# Patient Record
Sex: Female | Born: 1980
Health system: Southern US, Community
[De-identification: ages and names within clinical notes are randomized; demographics above are authoritative.]

## PROBLEM LIST (undated history)

## (undated) DIAGNOSIS — E7800 Pure hypercholesterolemia, unspecified: Secondary | ICD-10-CM

## (undated) DIAGNOSIS — E119 Type 2 diabetes mellitus without complications: Secondary | ICD-10-CM

## (undated) DIAGNOSIS — R197 Diarrhea, unspecified: Secondary | ICD-10-CM

## (undated) DIAGNOSIS — N926 Irregular menstruation, unspecified: Secondary | ICD-10-CM

## (undated) DIAGNOSIS — E1140 Type 2 diabetes mellitus with diabetic neuropathy, unspecified: Secondary | ICD-10-CM

## (undated) HISTORY — PX: CHOLECYSTECTOMY: SHX55

---

## 2002-04-16 ENCOUNTER — Encounter: Admission: RE | Admit: 2002-04-16 | Discharge: 2002-04-16 | Payer: Self-pay | Admitting: Obstetrics and Gynecology

## 2008-11-21 ENCOUNTER — Emergency Department (HOSPITAL_BASED_OUTPATIENT_CLINIC_OR_DEPARTMENT_OTHER): Admission: EM | Admit: 2008-11-21 | Discharge: 2008-11-21 | Payer: Self-pay | Admitting: Emergency Medicine

## 2009-03-10 ENCOUNTER — Ambulatory Visit: Payer: Self-pay | Admitting: Obstetrics and Gynecology

## 2009-03-10 ENCOUNTER — Encounter: Payer: Self-pay | Admitting: Physician Assistant

## 2009-03-10 ENCOUNTER — Other Ambulatory Visit: Payer: Self-pay | Admitting: Obstetrics and Gynecology

## 2009-03-10 LAB — CONVERTED CEMR LAB
FSH: 6.9 milliintl units/mL
LH: 9.9 milliintl units/mL

## 2009-03-31 ENCOUNTER — Ambulatory Visit: Payer: Self-pay | Admitting: Obstetrics and Gynecology

## 2009-09-22 ENCOUNTER — Emergency Department (HOSPITAL_BASED_OUTPATIENT_CLINIC_OR_DEPARTMENT_OTHER): Admission: EM | Admit: 2009-09-22 | Discharge: 2009-09-22 | Payer: Self-pay | Admitting: Emergency Medicine

## 2010-02-02 ENCOUNTER — Emergency Department (HOSPITAL_BASED_OUTPATIENT_CLINIC_OR_DEPARTMENT_OTHER): Admission: EM | Admit: 2010-02-02 | Discharge: 2009-09-24 | Payer: Self-pay | Admitting: Emergency Medicine

## 2010-05-14 LAB — GLUCOSE, CAPILLARY: Glucose-Capillary: 74 mg/dL (ref 70–99)

## 2010-06-02 LAB — GC/CHLAMYDIA PROBE AMP, GENITAL
Chlamydia, DNA Probe: NEGATIVE
GC Probe Amp, Genital: NEGATIVE

## 2010-06-02 LAB — RPR: RPR Ser Ql: NONREACTIVE

## 2010-06-02 LAB — URINALYSIS, ROUTINE W REFLEX MICROSCOPIC
Ketones, ur: 15 mg/dL — AB
Nitrite: NEGATIVE
Protein, ur: 100 mg/dL — AB

## 2010-06-02 LAB — PREGNANCY, URINE: Preg Test, Ur: NEGATIVE

## 2010-06-02 LAB — WET PREP, GENITAL: Clue Cells Wet Prep HPF POC: NONE SEEN

## 2011-08-01 ENCOUNTER — Emergency Department (HOSPITAL_BASED_OUTPATIENT_CLINIC_OR_DEPARTMENT_OTHER)
Admission: EM | Admit: 2011-08-01 | Discharge: 2011-08-01 | Disposition: A | Discharge status: Home / Self Care | Payer: Self-pay | Attending: Emergency Medicine | Admitting: Emergency Medicine

## 2011-08-01 ENCOUNTER — Encounter (HOSPITAL_BASED_OUTPATIENT_CLINIC_OR_DEPARTMENT_OTHER): Payer: Self-pay | Admitting: Emergency Medicine

## 2011-08-01 ENCOUNTER — Emergency Department (HOSPITAL_BASED_OUTPATIENT_CLINIC_OR_DEPARTMENT_OTHER): Payer: Self-pay

## 2011-08-01 DIAGNOSIS — K8020 Calculus of gallbladder without cholecystitis without obstruction: Secondary | ICD-10-CM | POA: Insufficient documentation

## 2011-08-01 DIAGNOSIS — R1011 Right upper quadrant pain: Secondary | ICD-10-CM | POA: Insufficient documentation

## 2011-08-01 DIAGNOSIS — R112 Nausea with vomiting, unspecified: Secondary | ICD-10-CM | POA: Insufficient documentation

## 2011-08-01 HISTORY — DX: Irregular menstruation, unspecified: N92.6

## 2011-08-01 LAB — URINE MICROSCOPIC-ADD ON

## 2011-08-01 LAB — COMPREHENSIVE METABOLIC PANEL
AST: 208 U/L — ABNORMAL HIGH (ref 0–37)
Albumin: 3.6 g/dL (ref 3.5–5.2)
Calcium: 9.5 mg/dL (ref 8.4–10.5)
Creatinine, Ser: 0.9 mg/dL (ref 0.50–1.10)
Total Protein: 8 g/dL (ref 6.0–8.3)

## 2011-08-01 LAB — LIPASE, BLOOD: Lipase: 52 U/L (ref 11–59)

## 2011-08-01 LAB — URINALYSIS, ROUTINE W REFLEX MICROSCOPIC
Glucose, UA: NEGATIVE mg/dL
Hgb urine dipstick: NEGATIVE
Specific Gravity, Urine: 1.027 (ref 1.005–1.030)
Urobilinogen, UA: 1 mg/dL (ref 0.0–1.0)

## 2011-08-01 LAB — PREGNANCY, URINE: Preg Test, Ur: NEGATIVE

## 2011-08-01 MED ORDER — SODIUM CHLORIDE 0.9 % IV BOLUS (SEPSIS)
1000.0000 mL | Freq: Once | INTRAVENOUS | Status: AC
  Administered 2011-08-01: 1000 mL via INTRAVENOUS

## 2011-08-01 MED ORDER — PROMETHAZINE HCL 25 MG/ML IJ SOLN
25.0000 mg | Freq: Once | INTRAMUSCULAR | Status: AC
  Administered 2011-08-01: 25 mg via INTRAVENOUS
  Filled 2011-08-01: qty 1

## 2011-08-01 MED ORDER — ONDANSETRON HCL 4 MG/2ML IJ SOLN
4.0000 mg | Freq: Once | INTRAMUSCULAR | Status: AC
  Administered 2011-08-01: 4 mg via INTRAVENOUS
  Filled 2011-08-01: qty 2

## 2011-08-01 MED ORDER — HYDROCODONE-ACETAMINOPHEN 5-500 MG PO TABS: 1.0000 | ORAL_TABLET | Freq: Four times a day (QID) | ORAL | Status: AC | PRN

## 2011-08-01 MED ORDER — HYDROMORPHONE HCL PF 1 MG/ML IJ SOLN
1.0000 mg | Freq: Once | INTRAMUSCULAR | Status: AC
  Administered 2011-08-01: 1 mg via INTRAVENOUS
  Filled 2011-08-01: qty 1

## 2011-08-01 MED ORDER — PROMETHAZINE HCL 25 MG PO TABS: 25.0000 mg | ORAL_TABLET | Freq: Four times a day (QID) | ORAL | Status: DC | PRN

## 2011-08-01 NOTE — Discharge Instructions (Signed)
It is very important to follow a fat-free diet. Use Phenergan as directed, as needed for nausea and vomiting using hydrocodone-acetaminophen for pain but do not drive or operate machinery with hydrocodone-acetaminophen use. It is also very important to call Dr. Ermalene Searing office first thing in the morning when the office opens around 8 AM to be seen in the office either tomorrow or the next day. Explain that you were seen in the emergency department and that Dr. Daphine Deutscher wanted you to be seen quickly in the office for your gallbladder disease. However return to the emergency department at any time for emergent changing or worsening of symptoms.

## 2011-08-01 NOTE — ED Notes (Signed)
Vomiting approx 3 days a week x3 weeks.  Worse in past 2 days.  Abd pain started in RUQ.  Now entire abdomen.

## 2011-08-01 NOTE — ED Provider Notes (Signed)
Medical screening examination/treatment/procedure(s) were performed by non-physician practitioner and as supervising physician I was immediately available for consultation/collaboration.   Orpha Dain A Ho Parisi, MD 08/01/11 2239 

## 2011-08-01 NOTE — ED Provider Notes (Signed)
History     CSN: [id number]  Arrival date & time 08/01/11  1551   First MD Initiated Contact with Patient 08/01/11 1553      Chief Complaint  Patient presents with  . Emesis  . Abdominal Pain    (Consider location/radiation/quality/duration/timing/severity/associated sxs/prior treatment) HPI  Patient presents to emergency department complaining of a one month history of intermittent abdominal pain with associated nausea and vomiting particularly surrounding eating. Patient states that she notes that when she eats she has increased abdominal pain and vomiting. Patient states pain is in the upper abdomen usually the right upper quadrant and described as a sharp cramping sensation. Patient states over the last 2 days she's had more frequency of abdominal pain and today while at school had acute onset of vomiting with numerous episodes and therefore the school called EMS. Patient states she waited for her parents to arrive at school who brought her up to emergency department. Patient states she has history of irregular periods that required her going on daily birth control for which she takes however that is her only known medical problems and only medicine she takes a regular basis. She denies history of abdominal surgeries. Patient states her LMP was May the 20th was a 9 day period which is heavier than her normal periods. She denies associated fevers, chills, chest pain, shortness of breath, dysuria, hematuria, blood in her stool, vaginal discharge, or dyspareunia. She is G0 P0 she denies alleviating factors. Symptoms are aggravated by eating. She's taken no medication for symptoms prior to arrival.  Past Medical History  Diagnosis Date  . Irregular periods     History reviewed. No pertinent past surgical history.  No family history on file.  History  Substance Use Topics  . Smoking status: Former Games developer  . Smokeless tobacco: Not on file  . Alcohol Use: Yes    OB History    Grav  Para Term Preterm Abortions TAB SAB Ect Mult Living                  Review of Systems  All other systems reviewed and are negative.    Allergies  Sulfa drugs cross reactors  Home Medications  No current outpatient prescriptions on file.  BP 136/75  Pulse 60  Temp(Src) 98.3 F (36.8 C) (Oral)  Resp 16  Ht 5\' 6"  (1.676 m)  Wt 200 lb (90.719 kg)  BMI 32.28 kg/m2  SpO2 100%  LMP 07/16/2011  Physical Exam  Nursing note and vitals reviewed. Constitutional: She is oriented to person, place, and time. She appears well-developed and well-nourished. No distress.  HENT:  Head: Normocephalic and atraumatic.  Eyes: Conjunctivae are normal.  Neck: Normal range of motion. Neck supple.  Cardiovascular: Normal rate, regular rhythm, normal heart sounds and intact distal pulses.  Exam reveals no gallop and no friction rub.   No murmur heard. Pulmonary/Chest: Effort normal and breath sounds normal. No respiratory distress. She has no wheezes. She has no rales. She exhibits no tenderness.  Abdominal: Soft. Bowel sounds are normal. She exhibits no distension and no mass. There is tenderness. There is no rebound and no guarding.       TTP of RUQ with guarding but no rigidity or peritoneal signs.   Musculoskeletal: Normal range of motion. She exhibits no edema and no tenderness.  Neurological: She is alert and oriented to person, place, and time.  Skin: Skin is warm and dry. No rash noted. She is not diaphoretic. No erythema.  Psychiatric: She has a normal mood and affect.    ED Course  Procedures (including critical care time)  IV zofran and dilaudid.   Labs Reviewed  COMPREHENSIVE METABOLIC PANEL - Abnormal; Notable for the following:    Potassium 3.3 (*)    AST 208 (*)    ALT 155 (*)    GFR calc non Af Amer 85 (*)    All other components within normal limits  URINALYSIS, ROUTINE W REFLEX MICROSCOPIC - Abnormal; Notable for the following:    Color, Urine AMBER (*) BIOCHEMICALS  MAY BE AFFECTED BY COLOR   pH 8.5 (*)    Bilirubin Urine SMALL (*)    Ketones, ur 15 (*)    Protein, ur 30 (*)    Leukocytes, UA SMALL (*)    All other components within normal limits  URINE MICROSCOPIC-ADD ON - Abnormal; Notable for the following:    Squamous Epithelial / LPF FEW (*)    Bacteria, UA MANY (*)    All other components within normal limits  LIPASE, BLOOD  PREGNANCY, URINE   US Abdomen Complete  08/01/2011  *RADIOLOGY REPORT*  Clinical Data:  Right upper quadrant pain, nausea, vomiting  ABDOMINAL ULTRASOUND COMPLETE  Comparison:  None.  Findings:  Gallbladder:  Numerous small echogenic shadowing gallstones noted. Wall thickness measures 2 mm.  No Murphy's sign.  Common Bile Duct:  Within normal limits in caliber.  Liver: No focal mass lesion identified.  Within normal limits in parenchymal echogenicity.  IVC:  Limited visualization.  No gross abnormality.  Pancreas:  No abnormality identified.  Spleen:  Limited visualization.  No focal abnormality.  4.2 cm length.  Right kidney:  Normal in size and parenchymal echogenicity.  No evidence of mass or hydronephrosis.  Left kidney:  Normal in size and parenchymal echogenicity.  No evidence of mass or hydronephrosis.  Abdominal Aorta:  No aneurysm identified.  IMPRESSION: Cholelithiasis.  No biliary dilatation or other acute process by ultrasound  Original Report Authenticated By: Judie Petit. Ruel Favors, M.D.   6:02 PM I reviewed history of present illness, physical exam, ultrasound findings, and labs with Dr. Daphine Deutscher with general surgery. Since patient's pain is under control however ongoing nausea he suggests continuing to hydrate with IV fluids and control nausea in the ER however he states that she is appropriate for discharge from the emergency department after nausea is under control with an order for a clear liquid diet at home andoutpatient management of nausea and pain with anti-emetics and pain medication. He requests for the patient to  call his office first thing in the morning to be worked in an office tomorrow to be further evaluated for her cholelithiasis. I spoke at length with patient about treatment plan and she is agreeable to plan. However she voices her understanding to return to emergency department at any time for emergent changing or worsening of symptoms.  1. Cholelithiasis   2. Nausea and vomiting       MDM  Afebrile and non toxic appearing with patient nausea now improved and tolerating PO fluids. Cholelithiasis without evidence of acute cholecystitis. No peritoneal signs.   Follow up established with general surgery for tomorrow. Patient agreeable to plan.         [place], [place] 08/01/11 [phone]

## 2011-08-07 NOTE — ED Notes (Signed)
Request for records received from [facility] sent records per request after receiving pt signauture of request of records to this clinic

## 2011-09-12 ENCOUNTER — Encounter (HOSPITAL_BASED_OUTPATIENT_CLINIC_OR_DEPARTMENT_OTHER): Payer: Self-pay | Admitting: Emergency Medicine

## 2011-09-12 ENCOUNTER — Emergency Department (HOSPITAL_BASED_OUTPATIENT_CLINIC_OR_DEPARTMENT_OTHER)
Admission: EM | Admit: 2011-09-12 | Discharge: 2011-09-12 | Disposition: A | Discharge status: Home / Self Care | Payer: Self-pay | Attending: Emergency Medicine | Admitting: Emergency Medicine

## 2011-09-12 DIAGNOSIS — N390 Urinary tract infection, site not specified: Secondary | ICD-10-CM | POA: Insufficient documentation

## 2011-09-12 DIAGNOSIS — Z9089 Acquired absence of other organs: Secondary | ICD-10-CM | POA: Insufficient documentation

## 2011-09-12 DIAGNOSIS — Z87891 Personal history of nicotine dependence: Secondary | ICD-10-CM | POA: Insufficient documentation

## 2011-09-12 LAB — URINE MICROSCOPIC-ADD ON

## 2011-09-12 LAB — URINALYSIS, ROUTINE W REFLEX MICROSCOPIC
Glucose, UA: NEGATIVE mg/dL
Ketones, ur: 15 mg/dL — AB
pH: 5 (ref 5.0–8.0)

## 2011-09-12 MED ORDER — CIPROFLOXACIN HCL 250 MG PO TABS: 250.0000 mg | ORAL_TABLET | Freq: Two times a day (BID) | ORAL | Status: AC

## 2011-09-12 MED ORDER — IBUPROFEN 600 MG PO TABS: 600.0000 mg | ORAL_TABLET | Freq: Four times a day (QID) | ORAL | Status: AC | PRN

## 2011-09-12 MED ORDER — IBUPROFEN 800 MG PO TABS
800.0000 mg | ORAL_TABLET | Freq: Once | ORAL | Status: AC
  Administered 2011-09-12: 800 mg via ORAL
  Filled 2011-09-12: qty 1

## 2011-09-12 MED ORDER — PHENAZOPYRIDINE HCL 200 MG PO TABS: 200.0000 mg | ORAL_TABLET | Freq: Three times a day (TID) | ORAL | Status: AC

## 2011-09-12 NOTE — ED Provider Notes (Signed)
History     CSN: [id number]  Arrival date & time 09/12/11  4132   First MD Initiated Contact with Patient 09/12/11 [phone]      Chief Complaint  Patient presents with  . Dysuria    (Consider location/radiation/quality/duration/timing/severity/associated sxs/prior treatment) HPI  C/o dysuria, urinary freq/urgency x 2 weeks, now worsening. Began to have right back pain since menstrual cycle began which is not atypical. Denies fever, possible chills. Denies N/V. C/O diffuse lower pain since onset of mc yesterday which is typical. Denies rash. Has tried azo, water and cranberry juice with min relief.   ED Notes, ED Provider Notes from 09/12/11 0000 to 09/12/11 06:48:49       Bufford Buttner, RN 09/12/2011 06:47      Pt c/o dysuria x 2 weeks. Pt had gall bladder surgery 08/17/11    Past Medical History  Diagnosis Date  . Irregular periods     Past Surgical History  Procedure Date  . Cholecystectomy     No family history on file.  History  Substance Use Topics  . Smoking status: Former Games developer  . Smokeless tobacco: Not on file  . Alcohol Use: Yes    OB History    Grav Para Term Preterm Abortions TAB SAB Ect Mult Living                  Review of Systems  All other systems reviewed and are negative.  except as noted HPI   Allergies  Sulfa drugs cross reactors  Home Medications   Current Outpatient Rx  Name Route Sig Dispense Refill  . CIPROFLOXACIN HCL 250 MG PO TABS Oral Take 1 tablet (250 mg total) by mouth every 12 (twelve) hours. 6 tablet 0  . IBUPROFEN 600 MG PO TABS Oral Take 1 tablet (600 mg total) by mouth every 6 (six) hours as needed for pain. 30 tablet 0  . TRI-NORINYL (28) PO Oral Take 1 tablet by mouth daily.    Marland Kitchen PHENAZOPYRIDINE HCL 200 MG PO TABS Oral Take 1 tablet (200 mg total) by mouth 3 (three) times daily. 6 tablet 0  . PROMETHAZINE HCL 25 MG PO TABS Oral Take 1 tablet (25 mg total) by mouth every 6 (six) hours as needed for nausea. 30 tablet  0    BP 121/78  Pulse 67  Temp 97.7 F (36.5 C) (Oral)  Resp 18  SpO2 100%  LMP 09/11/2011  Physical Exam  Nursing note and vitals reviewed. Constitutional: She is oriented to person, place, and time. She appears well-developed.  HENT:  Head: Atraumatic.  Mouth/Throat: Oropharynx is clear and moist.  Eyes: Conjunctivae and EOM are normal. Pupils are equal, round, and reactive to light.  Neck: Normal range of motion. Neck supple.  Cardiovascular: Normal rate, regular rhythm, normal heart sounds and intact distal pulses.   Pulmonary/Chest: Effort normal and breath sounds normal. No respiratory distress. She has no wheezes. She has no rales.  Abdominal: Soft. She exhibits no distension. There is no tenderness. There is no rebound and no guarding.       No CVAT  Musculoskeletal: Normal range of motion.  Neurological: She is alert and oriented to person, place, and time.  Skin: Skin is warm and dry. No rash noted.  Psychiatric: She has a normal mood and affect.    ED Course  Procedures (including critical care time)  Labs Reviewed  URINALYSIS, ROUTINE W REFLEX MICROSCOPIC - Abnormal; Notable for the following:    Color, Urine  RED (*)  BIOCHEMICALS MAY BE AFFECTED BY COLOR   APPearance TURBID (*)     Specific Gravity, Urine 1.036 (*)     Hgb urine dipstick LARGE (*)     Ketones, ur 15 (*)     Protein, ur 100 (*)     Leukocytes, UA SMALL (*)     All other components within normal limits  URINE MICROSCOPIC-ADD ON - Abnormal; Notable for the following:    Squamous Epithelial / LPF FEW (*)     Bacteria, UA MANY (*)     Crystals CA OXALATE CRYSTALS (*)     All other components within normal limits  PREGNANCY, URINE   No results found.   1. UTI (lower urinary tract infection)     MDM  Contaminated urine sample but likely UTI based on clinical history. No pyelonephritis at this time. I do not suspect acute abdominal infectious etiology such as appendicitis. No EMC  precluding discharge at this time. Given Precautions for return. PMD f/u.         Forbes Cellar, MD 09/12/11 [phone]

## 2011-09-12 NOTE — ED Notes (Signed)
Pt c/o dysuria x 2 weeks. Pt had gall bladder surgery 08/17/11

## 2011-09-18 ENCOUNTER — Encounter (HOSPITAL_BASED_OUTPATIENT_CLINIC_OR_DEPARTMENT_OTHER): Payer: Self-pay | Admitting: *Deleted

## 2011-09-18 ENCOUNTER — Emergency Department (HOSPITAL_BASED_OUTPATIENT_CLINIC_OR_DEPARTMENT_OTHER)
Admission: EM | Admit: 2011-09-18 | Discharge: 2011-09-18 | Disposition: A | Discharge status: Home / Self Care | Payer: Self-pay | Attending: Emergency Medicine | Admitting: Emergency Medicine

## 2011-09-18 ENCOUNTER — Emergency Department (HOSPITAL_BASED_OUTPATIENT_CLINIC_OR_DEPARTMENT_OTHER): Payer: Self-pay

## 2011-09-18 DIAGNOSIS — R109 Unspecified abdominal pain: Secondary | ICD-10-CM | POA: Insufficient documentation

## 2011-09-18 DIAGNOSIS — N201 Calculus of ureter: Secondary | ICD-10-CM | POA: Insufficient documentation

## 2011-09-18 DIAGNOSIS — Z9089 Acquired absence of other organs: Secondary | ICD-10-CM | POA: Insufficient documentation

## 2011-09-18 LAB — URINE MICROSCOPIC-ADD ON

## 2011-09-18 LAB — CBC WITH DIFFERENTIAL/PLATELET
Basophils Relative: 0 % (ref 0–1)
Eosinophils Absolute: 0.1 10*3/uL (ref 0.0–0.7)
HCT: 33.8 % — ABNORMAL LOW (ref 36.0–46.0)
Hemoglobin: 11.4 g/dL — ABNORMAL LOW (ref 12.0–15.0)
Lymphs Abs: 3.9 10*3/uL (ref 0.7–4.0)
MCH: 30.3 pg (ref 26.0–34.0)
MCHC: 33.7 g/dL (ref 30.0–36.0)
MCV: 89.9 fL (ref 78.0–100.0)
Monocytes Absolute: 0.9 10*3/uL (ref 0.1–1.0)
Monocytes Relative: 7 % (ref 3–12)

## 2011-09-18 LAB — URINALYSIS, ROUTINE W REFLEX MICROSCOPIC
Glucose, UA: NEGATIVE mg/dL
Hgb urine dipstick: NEGATIVE
Ketones, ur: 15 mg/dL — AB
Protein, ur: NEGATIVE mg/dL

## 2011-09-18 LAB — BASIC METABOLIC PANEL
BUN: 10 mg/dL (ref 6–23)
Creatinine, Ser: 0.9 mg/dL (ref 0.50–1.10)
GFR calc Af Amer: >90 mL/min (ref >90)
GFR calc non Af Amer: 85 mL/min — ABNORMAL LOW (ref >90)
Glucose, Bld: 109 mg/dL — ABNORMAL HIGH (ref 70–99)

## 2011-09-18 MED ORDER — HYDROMORPHONE HCL 2 MG PO TABS: 2.0000 mg | ORAL_TABLET | ORAL | Status: AC | PRN

## 2011-09-18 MED ORDER — TAMSULOSIN HCL 0.4 MG PO CAPS: 0.4000 mg | ORAL_CAPSULE | Freq: Every day | ORAL | Status: DC

## 2011-09-18 MED ORDER — HYDROMORPHONE HCL PF 1 MG/ML IJ SOLN
1.0000 mg | Freq: Once | INTRAMUSCULAR | Status: AC
  Administered 2011-09-18: 1 mg via INTRAVENOUS
  Filled 2011-09-18: qty 1

## 2011-09-18 MED ORDER — SODIUM CHLORIDE 0.9 % IV BOLUS (SEPSIS)
1000.0000 mL | Freq: Once | INTRAVENOUS | Status: AC
  Administered 2011-09-18: 1000 mL via INTRAVENOUS

## 2011-09-18 MED ORDER — NAPROXEN 500 MG PO TABS: 500.0000 mg | ORAL_TABLET | Freq: Two times a day (BID) | ORAL | Status: AC

## 2011-09-18 MED ORDER — ONDANSETRON HCL 4 MG/2ML IJ SOLN
4.0000 mg | Freq: Once | INTRAMUSCULAR | Status: AC
  Administered 2011-09-18: 4 mg via INTRAVENOUS
  Filled 2011-09-18: qty 2

## 2011-09-18 NOTE — ED Notes (Signed)
MD at bedside. 

## 2011-09-18 NOTE — ED Notes (Signed)
Patient transported to CT 

## 2011-09-18 NOTE — ED Provider Notes (Signed)
History     CSN: [id number]  Arrival date & time 09/18/11  0505   First MD Initiated Contact with Patient 09/18/11 [phone]      Chief Complaint  Patient presents with  . Urinary Frequency    (Consider location/radiation/quality/duration/timing/severity/associated sxs/prior treatment) Patient is a 31 y.o. female presenting with frequency. The history is provided by the patient.  Urinary Frequency   31 year old female had been seen here 6 days ago and diagnosed with a UTI and treated with an antibiotic. Patient says he took antibiotic and symptoms seemed to improve, but never completely resolved. Yesterday, she started having urinary urgency and frequency but was only urinating a small amount of the time. She has developed right flank pain which radiates around to the right lower quadrant pain this pain has been severe and she rated it at 9/10. She took 1200 mg of ibuprofen with no relief. She denies nausea vomiting and denies fever, chills, sweats. Nothing makes her pain better and nothing makes it worse.  Past Medical History  Diagnosis Date  . Irregular periods     Past Surgical History  Procedure Date  . Cholecystectomy     History reviewed. No pertinent family history.  History  Substance Use Topics  . Smoking status: Former Games developer  . Smokeless tobacco: Not on file  . Alcohol Use: Yes    OB History    Grav Para Term Preterm Abortions TAB SAB Ect Mult Living                  Review of Systems  Genitourinary: Positive for frequency.  All other systems reviewed and are negative.    Allergies  Sulfa drugs cross reactors  Home Medications   Current Outpatient Rx  Name Route Sig Dispense Refill  . IBUPROFEN 600 MG PO TABS Oral Take 1 tablet (600 mg total) by mouth every 6 (six) hours as needed for pain. 30 tablet 0  . TRI-NORINYL (28) PO Oral Take 1 tablet by mouth daily.    Marland Kitchen CIPROFLOXACIN HCL 250 MG PO TABS Oral Take 1 tablet (250 mg total) by mouth every 12  (twelve) hours. 6 tablet 0  . PROMETHAZINE HCL 25 MG PO TABS Oral Take 1 tablet (25 mg total) by mouth every 6 (six) hours as needed for nausea. 30 tablet 0    BP 137/81  Pulse 58  Temp 98 F (36.7 C) (Oral)  Resp 16  Ht 5\' 6"  (1.676 m)  Wt 210 lb (95.255 kg)  BMI 33.89 kg/m2  SpO2 100%  LMP 09/11/2011  Physical Exam  Nursing note and vitals reviewed.  31 year old female is resting comfortably and in no acute distress. Vital signs are significant for borderline bradycardia with heart rate of 58. Head is normocephalic and atraumatic. PERRLA, EOMI. Oropharynx is clear. Neck is nontender and supple. Back has no midline tenderness. There is moderate right CVA tenderness. Lungs are clear without rales, wheezes, or rhonchi. Heart has regular rate and rhythm without murmur. Abdomen is soft, flat without masses or hepatosplenomegaly. There is moderate right CVA tenderness and right lower abdominal tenderness. There is no rebound or guarding. Extremities have no cyanosis or edema, full range of motion is present. Skin is warm and dry without rash. Neurologic: Mental status is normal, cranial nerves are intact, there are no motor or sensory deficits.  ED Course  Procedures (including critical care time)  Results for orders placed during the hospital encounter of 09/18/11  URINALYSIS, ROUTINE W REFLEX  MICROSCOPIC      Component Value Range   Color, Urine AMBER (*) YELLOW   APPearance CLOUDY (*) CLEAR   Specific Gravity, Urine 1.034 (*) 1.005 - 1.030   pH 5.5  5.0 - 8.0   Glucose, UA NEGATIVE  NEGATIVE mg/dL   Hgb urine dipstick NEGATIVE  NEGATIVE   Bilirubin Urine SMALL (*) NEGATIVE   Ketones, ur 15 (*) NEGATIVE mg/dL   Protein, ur NEGATIVE  NEGATIVE mg/dL   Urobilinogen, UA 1.0  0.0 - 1.0 mg/dL   Nitrite NEGATIVE  NEGATIVE   Leukocytes, UA SMALL (*) NEGATIVE  PREGNANCY, URINE      Component Value Range   Preg Test, Ur NEGATIVE  NEGATIVE  CBC WITH DIFFERENTIAL      Component Value Range    WBC 11.9 (*) 4.0 - 10.5 K/uL   RBC 3.76 (*) 3.87 - 5.11 MIL/uL   Hemoglobin 11.4 (*) 12.0 - 15.0 g/dL   HCT 11.9 (*) 14.7 - 82.9 %   MCV 89.9  78.0 - 100.0 fL   MCH 30.3  26.0 - 34.0 pg   MCHC 33.7  30.0 - 36.0 g/dL   RDW 56.2  13.0 - 86.5 %   Platelets 351  150 - 400 K/uL   Neutrophils Relative 59  43 - 77 %   Neutro Abs 7.1  1.7 - 7.7 K/uL   Lymphocytes Relative 33  12 - 46 %   Lymphs Abs 3.9  0.7 - 4.0 K/uL   Monocytes Relative 7  3 - 12 %   Monocytes Absolute 0.9  0.1 - 1.0 K/uL   Eosinophils Relative 0  0 - 5 %   Eosinophils Absolute 0.1  0.0 - 0.7 K/uL   Basophils Relative 0  0 - 1 %   Basophils Absolute 0.0  0.0 - 0.1 K/uL  BASIC METABOLIC PANEL      Component Value Range   Sodium 137  135 - 145 mEq/L   Potassium 3.7  3.5 - 5.1 mEq/L   Chloride 101  96 - 112 mEq/L   CO2 24  19 - 32 mEq/L   Glucose, Bld 109 (*) 70 - 99 mg/dL   BUN 10  6 - 23 mg/dL   Creatinine, Ser 7.84  0.50 - 1.10 mg/dL   Calcium 9.2  8.4 - 69.6 mg/dL   GFR calc non Af Amer 85 (*) >90 mL/min   GFR calc Af Amer >90  >90 mL/min  URINE MICROSCOPIC-ADD ON      Component Value Range   Squamous Epithelial / LPF RARE  RARE   WBC, UA 0-2  <3 WBC/hpf   RBC / HPF 0-2  <3 RBC/hpf   Bacteria, UA RARE  RARE   Crystals CA OXALATE CRYSTALS (*) NEGATIVE   Urine-Other MICROSCOPIC EXAM PERFORMED ON UNCONCENTRATED URINE     Ct Abdomen Pelvis Wo Contrast  09/18/2011  *RADIOLOGY REPORT*  Clinical Data: Right flank pain.  CT ABDOMEN AND PELVIS WITHOUT CONTRAST  Technique:  Multidetector CT imaging of the abdomen and pelvis was performed following the standard protocol without intravenous contrast.  Comparison: None  Findings: The lung bases are clear.  No pleural effusion.  The unenhanced appearance of the liver is unremarkable.  No focal lesions or intrahepatic biliary dilatation to the gallbladder surgically absent.  No common bile duct dilatation.  The pancreas is unremarkable.  The spleen is normal in size.  No  focal lesions. The adrenal glands are normal.  The right kidney demonstrates  mild edema and mild right-sided hydronephrosis and hydroureter.  There is a 2 mm distal right ureteral calculus.  No bladder calculi.  No left-sided ureteral calculi.  The stomach, duodenum, small bowel and colon are grossly normal without oral contrast.  The appendix is normal.  No mesenteric or retroperitoneal mass or adenopathy.  Small scattered lymph nodes are noted.  The aorta is normal in caliber.  No atherosclerotic changes.  The uterus and ovaries are normal.  The bladder is normal.  No pelvic mass, adenopathy or free pelvic fluid collections.  No inguinal mass or hernia.  The bony structures are unremarkable.  IMPRESSION: 2 mm distal right ureteral calculus causing mild right-sided hydroureteronephrosis.  Original Report Authenticated By: P. Loralie Champagne, M.D.      1. Ureterolithiasis       MDM  Urinary tract infection with pyelonephritis versus ureteral colic. Her prior record was reviewed and she was given a three-day course of ciprofloxacin and Pyridium in the ED visit 6 days ago. Urinalysis at that time was clearly contaminated and alert she was treated based on symptoms. Urinalysis will be repeated today and if not clearly showing UTI, will will get CT scan. Of note, her recent renal ultrasound did not show any nephrolithiasis.  CT scan does show right ureteral calculus with hydronephrosis which would account for her pain. She had partial pain relief with hydromorphone and ondansetron. A second dose of hydromorphone gave very good relief of pain. She will be sent home with prescriptions for hydromorphone, naproxen, and tamsulosin and is referred to urology for followup. Her calculus is only 2 mm and would have been expected to have passed, so she may need to urologic evaluation.     Dione Booze, MD 09/18/11 [phone]

## 2011-09-18 NOTE — ED Notes (Signed)
Pt seen here last week and dx'd with UTI, pt finished meds but states that sxs have not improved.

## 2011-09-20 ENCOUNTER — Encounter (HOSPITAL_BASED_OUTPATIENT_CLINIC_OR_DEPARTMENT_OTHER): Payer: Self-pay

## 2011-09-20 DIAGNOSIS — Z87891 Personal history of nicotine dependence: Secondary | ICD-10-CM | POA: Insufficient documentation

## 2011-09-20 DIAGNOSIS — N201 Calculus of ureter: Secondary | ICD-10-CM | POA: Insufficient documentation

## 2011-09-20 DIAGNOSIS — T40605A Adverse effect of unspecified narcotics, initial encounter: Secondary | ICD-10-CM | POA: Insufficient documentation

## 2011-09-20 DIAGNOSIS — K5909 Other constipation: Secondary | ICD-10-CM | POA: Insufficient documentation

## 2011-09-20 NOTE — ED Notes (Signed)
C/o constipation-no BM x 2-3 days

## 2011-09-21 ENCOUNTER — Emergency Department (HOSPITAL_BASED_OUTPATIENT_CLINIC_OR_DEPARTMENT_OTHER)
Admission: EM | Admit: 2011-09-21 | Discharge: 2011-09-21 | Disposition: A | Discharge status: Home / Self Care | Payer: Self-pay | Attending: Emergency Medicine | Admitting: Emergency Medicine

## 2011-09-21 DIAGNOSIS — K5903 Drug induced constipation: Secondary | ICD-10-CM

## 2011-09-21 MED ORDER — MAGNESIUM CITRATE PO SOLN
1.0000 | Freq: Once | ORAL | Status: AC
  Administered 2011-09-21: 1 via ORAL
  Filled 2011-09-21: qty 296

## 2011-09-21 NOTE — ED Notes (Signed)
MD at bedside, rectal exam performed. Pt tolerated well.

## 2011-09-21 NOTE — ED Notes (Signed)
Pt was at [facility] three days ago, diagnosed with a kidney stone, and prescribed a narcotic. States that she is now constipated from the narcotic and has not had a bowel movement for two days. States that she tried to disimpact herself with no success. States that she took one dose of Miralax earlier today with no success. States that rectal pain is 10/10.

## 2011-09-21 NOTE — ED Provider Notes (Signed)
History     CSN: [id number]  Arrival date & time 09/20/11  2217   First MD Initiated Contact with Patient 09/21/11 0216      Chief Complaint  Patient presents with  . Constipation    (Consider location/radiation/quality/duration/timing/severity/associated sxs/prior treatment) HPI This is a 31 year old black female who was diagnosed with a ureteral stone several days ago and was placed on hydromorphone. She is subsequently not had a bowel movement for about the last 2-3 days. She is feeling rectal pressure but not frank pain. The pain of her ureteral colic comes and goes. It has been reasonably controlled with the indication she was prescribed. She took some MiraLAX yesterday without relief. She states the rectal pressure has improved since she has been waiting in the ED.  Past Medical History  Diagnosis Date  . Irregular periods   . Kidney stone     Past Surgical History  Procedure Date  . Cholecystectomy     No family history on file.  History  Substance Use Topics  . Smoking status: Former Games developer  . Smokeless tobacco: Not on file  . Alcohol Use: Yes    OB History    Grav Para Term Preterm Abortions TAB SAB Ect Mult Living                  Review of Systems  All other systems reviewed and are negative.    Allergies  Sulfa drugs cross reactors  Home Medications   Current Outpatient Rx  Name Route Sig Dispense Refill  . CIPROFLOXACIN HCL 250 MG PO TABS Oral Take 1 tablet (250 mg total) by mouth every 12 (twelve) hours. 6 tablet 0  . HYDROMORPHONE HCL 2 MG PO TABS Oral Take 1 tablet (2 mg total) by mouth every 4 (four) hours as needed for pain. 20 tablet 0  . IBUPROFEN 600 MG PO TABS Oral Take 1 tablet (600 mg total) by mouth every 6 (six) hours as needed for pain. 30 tablet 0  . NAPROXEN 500 MG PO TABS Oral Take 1 tablet (500 mg total) by mouth 2 (two) times daily. 30 tablet 0  . TRI-NORINYL (28) PO Oral Take 1 tablet by mouth daily.    Marland Kitchen PROMETHAZINE HCL  25 MG PO TABS Oral Take 1 tablet (25 mg total) by mouth every 6 (six) hours as needed for nausea. 30 tablet 0  . TAMSULOSIN HCL 0.4 MG PO CAPS Oral Take 1 capsule (0.4 mg total) by mouth daily. 10 capsule 0    BP 125/74  Pulse 62  Temp 98.2 F (36.8 C) (Oral)  Resp 16  Ht 5\' 6"  (1.676 m)  Wt 210 lb (95.255 kg)  BMI 33.89 kg/m2  SpO2 100%  LMP 09/11/2011  Physical Exam General: Well-developed, well-nourished female in no acute distress; appearance consistent with age of record HENT: normocephalic, atraumatic Eyes: pupils equal round and reactive to light; extraocular muscles intact Neck: supple Heart: regular rate and rhythm Lungs: clear to auscultation bilaterally Abdomen: soft; nondistended; nontender; no masses or hepatosplenomegaly; bowel sounds present Rectal: Normal tone; no stool in vault Extremities: No deformity; full range of motion Neurologic: Awake, alert and oriented; motor function intact in all extremities and symmetric; no facial droop Skin: Warm and dry Psychiatric: Normal mood and affect    ED Course  Procedures (including critical care time)     MDM  We'll administer magnesium citrate in the ED and advise her to continue the MiraLAX as long as she is taking  the hydromorphone.        Hanley Seamen, MD 09/21/11 [phone]

## 2012-08-21 ENCOUNTER — Encounter (HOSPITAL_BASED_OUTPATIENT_CLINIC_OR_DEPARTMENT_OTHER): Payer: Self-pay

## 2012-08-21 ENCOUNTER — Emergency Department (HOSPITAL_BASED_OUTPATIENT_CLINIC_OR_DEPARTMENT_OTHER)
Admission: EM | Admit: 2012-08-21 | Discharge: 2012-08-21 | Disposition: A | Discharge status: Home / Self Care | Payer: Self-pay | Attending: Emergency Medicine | Admitting: Emergency Medicine

## 2012-08-21 DIAGNOSIS — J3489 Other specified disorders of nose and nasal sinuses: Secondary | ICD-10-CM | POA: Insufficient documentation

## 2012-08-21 DIAGNOSIS — F17200 Nicotine dependence, unspecified, uncomplicated: Secondary | ICD-10-CM | POA: Insufficient documentation

## 2012-08-21 DIAGNOSIS — Z8742 Personal history of other diseases of the female genital tract: Secondary | ICD-10-CM | POA: Insufficient documentation

## 2012-08-21 DIAGNOSIS — R0982 Postnasal drip: Secondary | ICD-10-CM | POA: Insufficient documentation

## 2012-08-21 DIAGNOSIS — Z79899 Other long term (current) drug therapy: Secondary | ICD-10-CM | POA: Insufficient documentation

## 2012-08-21 DIAGNOSIS — J329 Chronic sinusitis, unspecified: Secondary | ICD-10-CM | POA: Insufficient documentation

## 2012-08-21 DIAGNOSIS — R209 Unspecified disturbances of skin sensation: Secondary | ICD-10-CM | POA: Insufficient documentation

## 2012-08-21 DIAGNOSIS — H6690 Otitis media, unspecified, unspecified ear: Secondary | ICD-10-CM | POA: Insufficient documentation

## 2012-08-21 DIAGNOSIS — R51 Headache: Secondary | ICD-10-CM | POA: Insufficient documentation

## 2012-08-21 DIAGNOSIS — H6692 Otitis media, unspecified, left ear: Secondary | ICD-10-CM

## 2012-08-21 DIAGNOSIS — Z87442 Personal history of urinary calculi: Secondary | ICD-10-CM | POA: Insufficient documentation

## 2012-08-21 DIAGNOSIS — Z791 Long term (current) use of non-steroidal anti-inflammatories (NSAID): Secondary | ICD-10-CM | POA: Insufficient documentation

## 2012-08-21 MED ORDER — CETIRIZINE-PSEUDOEPHEDRINE ER 5-120 MG PO TB12: 1.0000 | ORAL_TABLET | Freq: Every day | ORAL | Status: DC

## 2012-08-21 MED ORDER — AMOXICILLIN 500 MG PO CAPS: 500.0000 mg | ORAL_CAPSULE | Freq: Three times a day (TID) | ORAL | Status: DC

## 2012-08-21 NOTE — ED Notes (Signed)
C/o numbness to mouth and tongue, left ear pain x 2 days-states she just came from work out

## 2012-08-21 NOTE — ED Provider Notes (Addendum)
History    CSN: [id number] Arrival date & time 08/21/12  1440  First MD Initiated Contact with Patient 08/21/12 1506     Chief Complaint  Patient presents with  . Numbness   (Consider location/radiation/quality/duration/timing/severity/associated sxs/prior Treatment) Patient is a 32 y.o. female presenting with ear pain. The history is provided by the patient.  Otalgia Location:  Left Behind ear:  No abnormality Quality:  Sharp and sore Severity:  Moderate Onset quality:  Gradual Duration:  2 days Timing:  Constant Progression:  Unchanged Chronicity:  Recurrent Context: no water in ear   Relieved by:  Nothing Worsened by:  Nothing tried Ineffective treatments:  None tried Associated symptoms: congestion, headaches and rhinorrhea   Associated symptoms: no cough, no ear discharge, no fever, no neck pain and no sore throat   Associated symptoms comment:  States for 2 days has had bad taste in mouth and tingling of the bilateral tongue and lips Risk factors: no chronic ear infection and no prior ear surgery    Past Medical History  Diagnosis Date  . Irregular periods   . Kidney stone    Past Surgical History  Procedure Laterality Date  . Cholecystectomy     No family history on file. History  Substance Use Topics  . Smoking status: Current Every Day Smoker  . Smokeless tobacco: Not on file  . Alcohol Use: Yes   OB History   Grav Para Term Preterm Abortions TAB SAB Ect Mult Living                 Review of Systems  Constitutional: Negative for fever.  HENT: Positive for ear pain, congestion, rhinorrhea and postnasal drip. Negative for sore throat, neck pain and ear discharge.   Respiratory: Negative for cough.   Neurological: Positive for headaches.  All other systems reviewed and are negative.    Allergies  Sulfa drugs cross reactors  Home Medications   Current Outpatient Rx  Name  Route  Sig  Dispense  Refill  . naproxen (NAPROSYN) 500 MG tablet  Oral   Take 1 tablet (500 mg total) by mouth 2 (two) times daily.   30 tablet   0   . Norethin-Eth Estrad Triphasic (TRI-NORINYL, 28, PO)   Oral   Take 1 tablet by mouth daily.         Marland Kitchen EXPIRED: promethazine (PHENERGAN) 25 MG tablet   Oral   Take 1 tablet (25 mg total) by mouth every 6 (six) hours as needed for nausea.   30 tablet   0   . Tamsulosin HCl (FLOMAX) 0.4 MG CAPS   Oral   Take 1 capsule (0.4 mg total) by mouth daily.   10 capsule   0    BP 137/69  Pulse 98  Temp(Src) 99.4 F (37.4 C) (Oral)  Resp 16  Ht 5\' 6"  (1.676 m)  Wt 235 lb (106.595 kg)  BMI 37.95 kg/m2  SpO2 100%  LMP 07/24/2012 Physical Exam  Nursing note and vitals reviewed. Constitutional: She is oriented to person, place, and time. She appears well-developed and well-nourished. No distress.  HENT:  Head: Normocephalic and atraumatic.  Right Ear: Tympanic membrane and ear canal normal.  Left Ear: Ear canal normal. No mastoid tenderness. Tympanic membrane is injected, erythematous and bulging. A middle ear effusion is present.  Ears:  Nose: Mucosal edema present. Left sinus exhibits maxillary sinus tenderness.  Mouth/Throat: Mucous membranes are normal. No edematous.    Eyes: EOM are normal. Pupils  are equal, round, and reactive to light.  Cardiovascular: Normal rate, regular rhythm, normal heart sounds and intact distal pulses.  Exam reveals no friction rub.   No murmur heard. Pulmonary/Chest: Effort normal and breath sounds normal. She has no wheezes. She has no rales.  Abdominal: Soft. Bowel sounds are normal. She exhibits no distension. There is no tenderness. There is no rebound and no guarding.  Musculoskeletal: Normal range of motion. She exhibits no tenderness.  No edema  Neurological: She is alert and oriented to person, place, and time. No cranial nerve deficit.  Skin: Skin is warm and dry. No rash noted.  Psychiatric: She has a normal mood and affect. Her behavior is normal.     ED Course  Procedures (including critical care time) Labs Reviewed - No data to display No results found. 1. Otitis media, left   2. Sinusitis     MDM    Pt here c/o of bad taste in mouth, tingling of bilateral lips and tongue and left ear pain.  Pt found to have left otitis media, no dental issues and sinus congestion.  Feel most likely sinus problems are the cause of pt's sx and most likely the cause for her otitis.  Will treat with abx and have pt start zyrtec for allergies.  She is unable to afford nasal spray.  Gwyneth Sprout, MD 08/21/12 1541  Gwyneth Sprout, MD 08/21/12 1544

## 2013-03-02 ENCOUNTER — Emergency Department (HOSPITAL_BASED_OUTPATIENT_CLINIC_OR_DEPARTMENT_OTHER)
Admission: EM | Admit: 2013-03-02 | Discharge: 2013-03-02 | Disposition: A | Discharge status: Home / Self Care | Payer: Self-pay | Attending: Emergency Medicine | Admitting: Emergency Medicine

## 2013-03-02 ENCOUNTER — Encounter (HOSPITAL_BASED_OUTPATIENT_CLINIC_OR_DEPARTMENT_OTHER): Payer: Self-pay | Admitting: Emergency Medicine

## 2013-03-02 ENCOUNTER — Emergency Department (HOSPITAL_BASED_OUTPATIENT_CLINIC_OR_DEPARTMENT_OTHER): Payer: Self-pay

## 2013-03-02 DIAGNOSIS — H579 Unspecified disorder of eye and adnexa: Secondary | ICD-10-CM | POA: Insufficient documentation

## 2013-03-02 DIAGNOSIS — F17200 Nicotine dependence, unspecified, uncomplicated: Secondary | ICD-10-CM | POA: Insufficient documentation

## 2013-03-02 DIAGNOSIS — J069 Acute upper respiratory infection, unspecified: Secondary | ICD-10-CM | POA: Insufficient documentation

## 2013-03-02 DIAGNOSIS — Z3202 Encounter for pregnancy test, result negative: Secondary | ICD-10-CM | POA: Insufficient documentation

## 2013-03-02 DIAGNOSIS — Z8742 Personal history of other diseases of the female genital tract: Secondary | ICD-10-CM | POA: Insufficient documentation

## 2013-03-02 DIAGNOSIS — Z87442 Personal history of urinary calculi: Secondary | ICD-10-CM | POA: Insufficient documentation

## 2013-03-02 DIAGNOSIS — Z792 Long term (current) use of antibiotics: Secondary | ICD-10-CM | POA: Insufficient documentation

## 2013-03-02 DIAGNOSIS — H9209 Otalgia, unspecified ear: Secondary | ICD-10-CM | POA: Insufficient documentation

## 2013-03-02 DIAGNOSIS — Z79899 Other long term (current) drug therapy: Secondary | ICD-10-CM | POA: Insufficient documentation

## 2013-03-02 LAB — PREGNANCY, URINE: Preg Test, Ur: NEGATIVE

## 2013-03-02 NOTE — ED Notes (Signed)
Pt reports watery eyes, increased sore throat since eating shrimp earlier.

## 2013-03-02 NOTE — ED Notes (Signed)
Pt reports cough, congestion, shortness of breath x4 days.

## 2013-03-02 NOTE — ED Provider Notes (Signed)
CSN: [id number]     Arrival date & time 03/02/13  0023 History  This chart was scribed for Milanna Kozlov Alfonso Patten, MD by Ludger Nutting, ED Scribe. This patient was seen in room MH10/MH10 and the patient's care was started 12:49 AM.    Chief Complaint  Patient presents with  . Cough    Patient is a 33 y.o. female presenting with cough. The history is provided by the patient. No language interpreter was used.  Cough Cough characteristics:  Productive Sputum characteristics:  Nondescript Severity:  Mild Onset quality:  Gradual Timing:  Intermittent Progression:  Unchanged Chronicity:  New Smoker: yes   Context: not animal exposure   Relieved by:  Nothing Worsened by:  Nothing tried Ineffective treatments:  None tried Associated symptoms: ear pain, sinus congestion and sore throat   Associated symptoms: no chest pain, no chills, no diaphoresis, no eye discharge, no fever and no myalgias   Ear pain:    Location:  Bilateral   Severity:  Moderate   Onset quality:  Gradual   Timing:  Constant   Progression:  Unchanged   Chronicity:  New Risk factors: no chemical exposure and no recent travel     HPI Comments: Colleen Downs is a 33 y.o. female who presents to the Emergency Department complaining of 4 days of gradual onset, gradually worsening, constant cough with associated congestion and mild SOB. She also reports having a sore throat, watery eyes, ear pain. She reports eating shrimp earlier tonight which worsened her sore throat. She has taken Mucinex with mild relief. She denies any other symptoms at this time.   Past Medical History  Diagnosis Date  . Irregular periods   . Kidney stone    Past Surgical History  Procedure Laterality Date  . Cholecystectomy     No family history on file. History  Substance Use Topics  . Smoking status: Current Every Day Smoker  . Smokeless tobacco: Not on file  . Alcohol Use: Yes     Comment: socially    OB History   Grav Para Term Preterm  Abortions TAB SAB Ect Mult Living                 Review of Systems  Constitutional: Negative for fever, chills and diaphoresis.  HENT: Positive for congestion, ear pain and sore throat.   Eyes: Negative for discharge.  Respiratory: Positive for cough.   Cardiovascular: Negative for chest pain.  Musculoskeletal: Negative for myalgias.  All other systems reviewed and are negative.    Allergies  Sulfa drugs cross reactors  Home Medications   Current Outpatient Rx  Name  Route  Sig  Dispense  Refill  . amoxicillin (AMOXIL) 500 MG capsule   Oral   Take 1 capsule (500 mg total) by mouth 3 (three) times daily.   21 capsule   0   . cetirizine-pseudoephedrine (ZYRTEC-D) 5-120 MG per tablet   Oral   Take 1 tablet by mouth daily.   30 tablet   0   . Norethin-Eth Estrad Triphasic (TRI-NORINYL, 28, PO)   Oral   Take 1 tablet by mouth daily.         Marland Kitchen EXPIRED: promethazine (PHENERGAN) 25 MG tablet   Oral   Take 1 tablet (25 mg total) by mouth every 6 (six) hours as needed for nausea.   30 tablet   0   . Tamsulosin HCl (FLOMAX) 0.4 MG CAPS   Oral   Take 1 capsule (0.4 mg total)  by mouth daily.   10 capsule   0    BP 125/64  Pulse 68  Temp(Src) 98.6 F (37 C) (Oral)  Resp 20  Ht 5\' 6"  (1.676 m)  Wt 245 lb (111.131 kg)  BMI 39.56 kg/m2  SpO2 100%  LMP 01/30/2013 Physical Exam  Nursing note and vitals reviewed. Constitutional: She is oriented to person, place, and time. She appears well-developed and well-nourished.  HENT:  Head: Normocephalic and atraumatic.  Right Ear: Tympanic membrane and external ear normal.  Left Ear: Tympanic membrane and external ear normal.  Mouth/Throat: Uvula is midline, oropharynx is clear and moist and mucous membranes are normal. Mucous membranes are not dry. No oropharyngeal exudate, posterior oropharyngeal edema, posterior oropharyngeal erythema or tonsillar abscesses.  Mallampati class 1.   Eyes: EOM are normal. Pupils are  equal, round, and reactive to light.  Neck: Normal range of motion. Neck supple. No tracheal deviation present.  Cardiovascular: Normal rate, regular rhythm and normal heart sounds.   Pulmonary/Chest: Effort normal and breath sounds normal. No stridor. No respiratory distress. She has no wheezes. She has no rhonchi. She has no rales.  Abdominal: Soft. Bowel sounds are normal. She exhibits no distension. There is no tenderness. There is no rebound and no guarding.  Neurological: She is alert and oriented to person, place, and time.  Skin: Skin is warm and dry.  Psychiatric: She has a normal mood and affect.    ED Course  Procedures (including critical care time)  DIAGNOSTIC STUDIES: Oxygen Saturation is 100% on RA, normal by my interpretation.    COORDINATION OF CARE: 12:53 AM Discussed treatment plan with pt at bedside and pt agreed to plan.   Labs Review Labs Reviewed - No data to display Imaging Review No results found.  EKG Interpretation   None       MDM  No diagnosis found. PERC negative Wells 0.  Constellation of symptoms consistent with viral etiology  I personally performed the services described in this documentation, which was scribed in my presence. The recorded information has been reviewed and is accurate.    Carlisle Beers, MD 03/02/13 [phone]

## 2013-03-02 NOTE — Discharge Instructions (Signed)
Cool Mist Vaporizers °Vaporizers may help relieve the symptoms of a cough and cold. They add moisture to the air, which helps mucus to become thinner and less sticky. This makes it easier to breathe and cough up secretions. Cool mist vaporizers do not cause serious burns like hot mist vaporizers ("steamers, humidifiers"). Vaporizers have not been proved to show they help with colds. You should not use a vaporizer if you are allergic to mold.  °HOME CARE INSTRUCTIONS °· Follow the package instructions for the vaporizer. °· Do not use anything other than distilled water in the vaporizer. °· Do not run the vaporizer all of the time. This can cause mold or bacteria to grow in the vaporizer. °· Clean the vaporizer after each time it is used. °· Clean and dry the vaporizer well before storing it. °· Stop using the vaporizer if worsening respiratory symptoms develop. °Document Released: 11/10/2003 Document Revised: 10/15/2012 Document Reviewed: 07/02/2012 °ExitCare® Patient Information ©2014 [employer]. ° °

## 2013-05-19 IMAGING — CT CT ABD-PELV W/O CM
2 of 4 series · 16 of 46 positions shown, 18 images · non-contrast
Comparison: None

CLINICAL DATA: Right flank pain.

CT ABDOMEN AND PELVIS WITHOUT CONTRAST
TECHNIQUE: Multidetector CT imaging of the abdomen and pelvis was
performed following the standard protocol without intravenous
contrast.

[Series 2: renal stone < 200 lbs 5.0 b31f · axial · 0.71mm/px · z∈[-466,-56]mm · 13 of 90 slices shown, 15 images]
[im 4/90  soft-tissue]
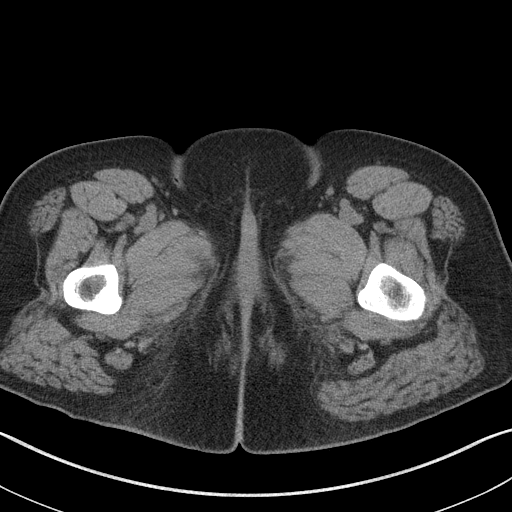
[im 4/90  bone]
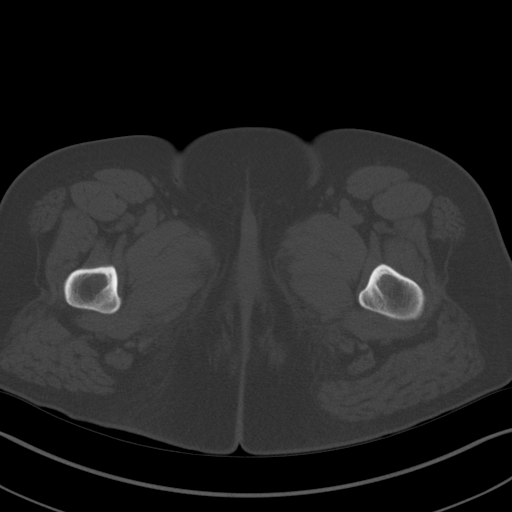
[im 12/90  soft-tissue]
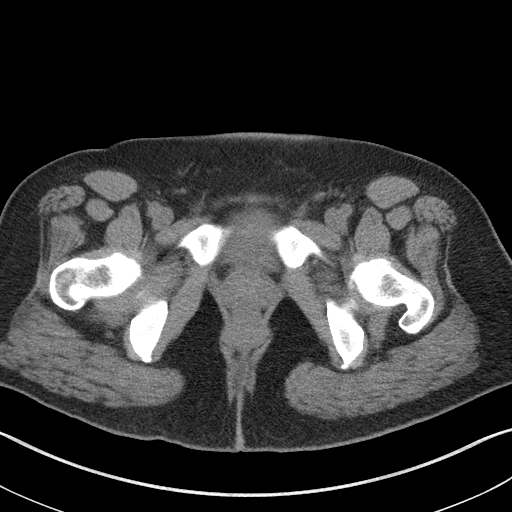
[im 19/90  soft-tissue]
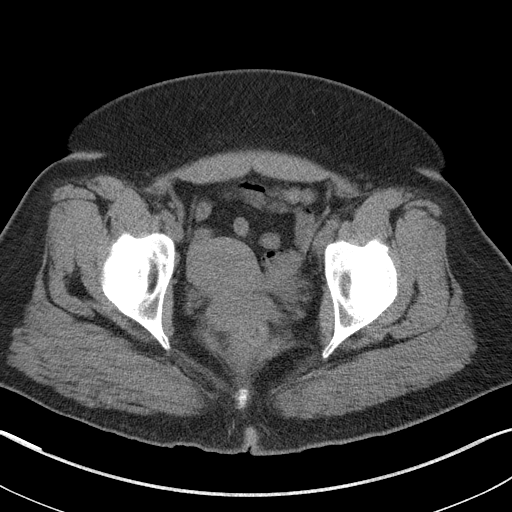
[im 26/90  soft-tissue]
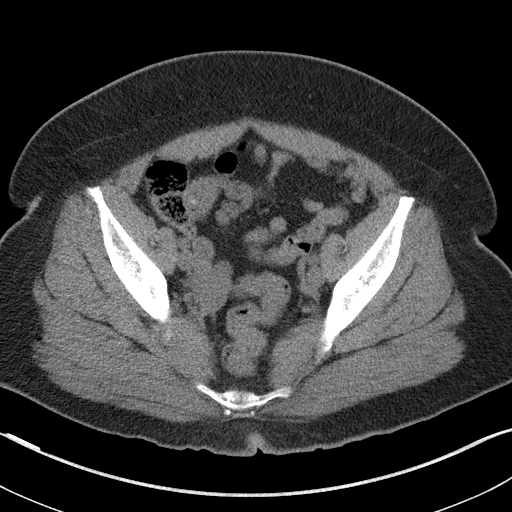
[im 30/90  soft-tissue]
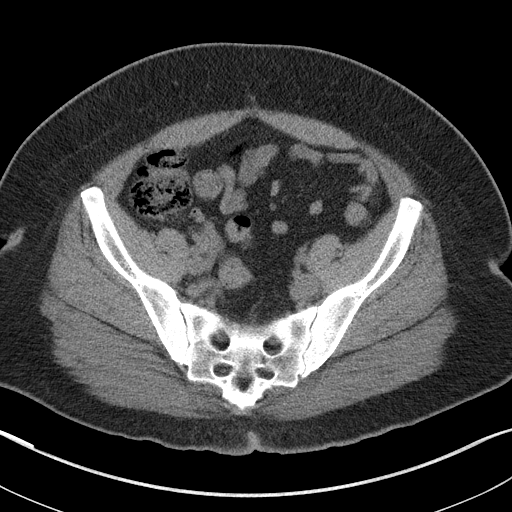
[im 38/90  soft-tissue]
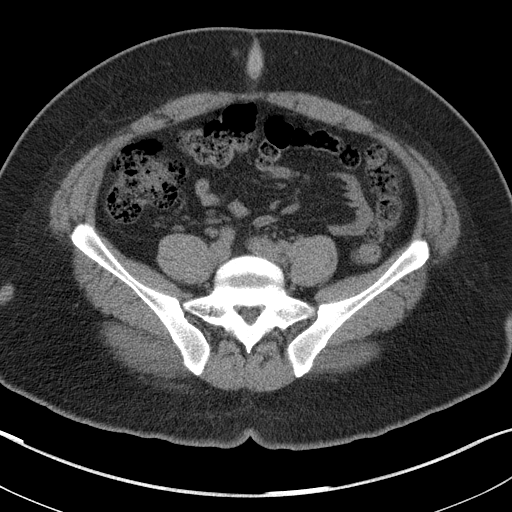
[im 45/90  soft-tissue]
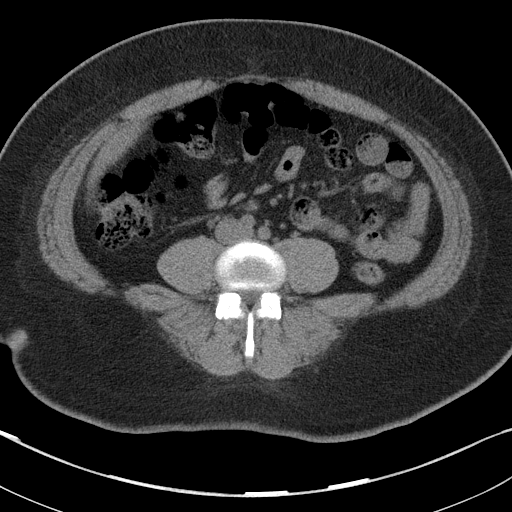
[im 52/90  soft-tissue]
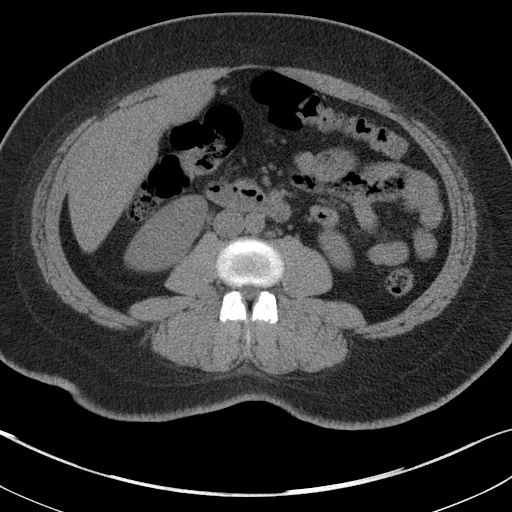
[im 60/90  soft-tissue]
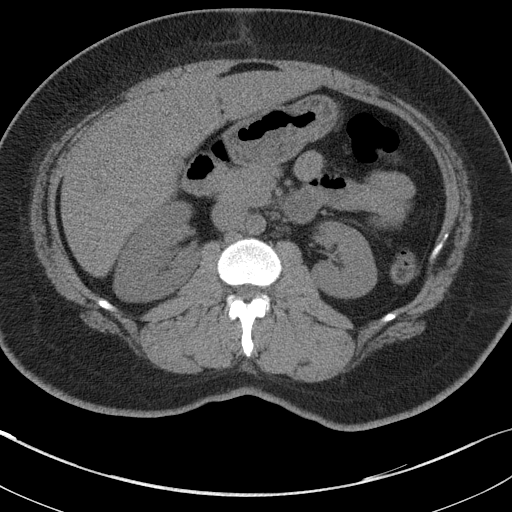
[im 60/90  bone]
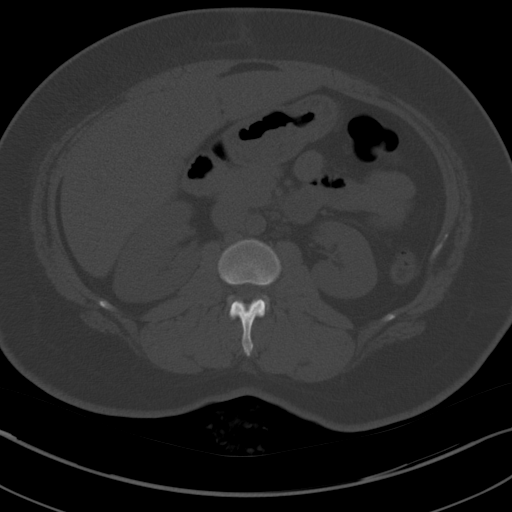
[im 64/90  soft-tissue]
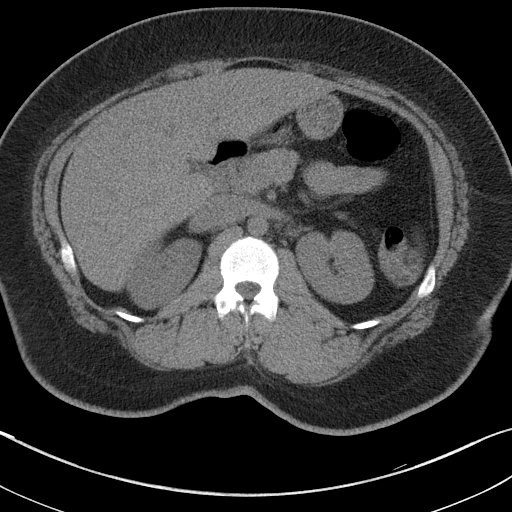
[im 71/90  soft-tissue]
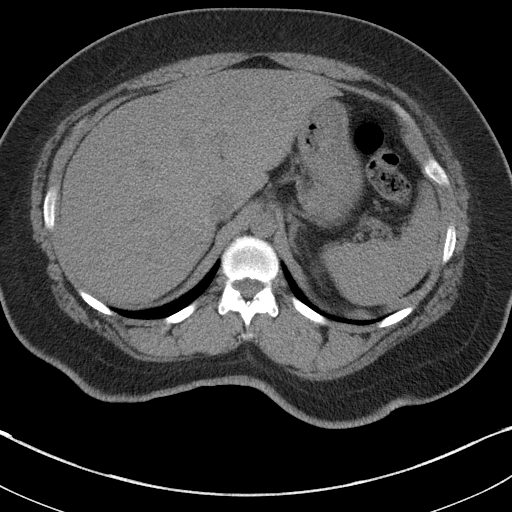
[im 78/90  soft-tissue]
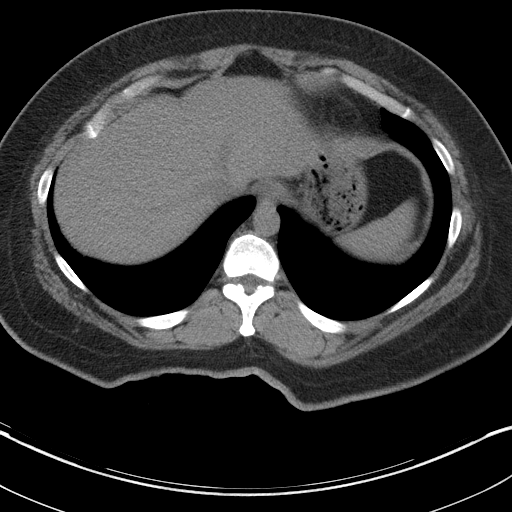
[im 86/90  soft-tissue]
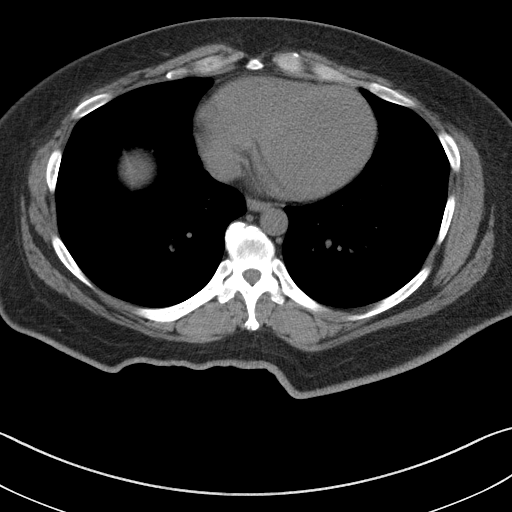

[Series 4: renal stone 3.0 coronal · coronal · 0.66mm/px · 3 of 86 slices shown]
[im 29/86  soft-tissue]
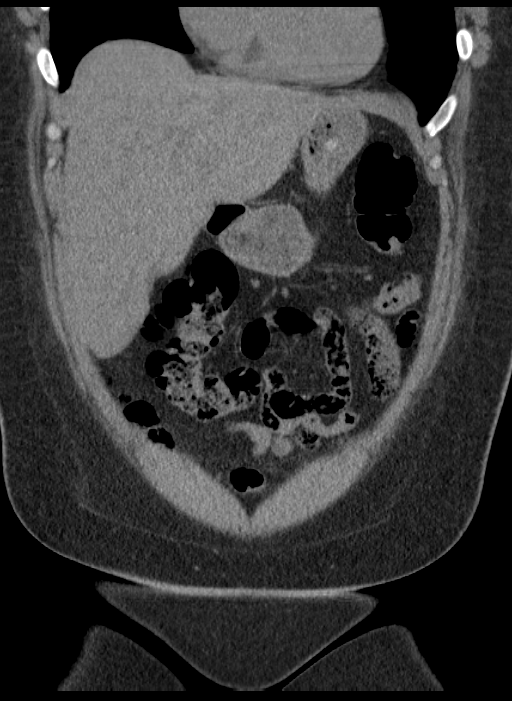
[im 38/86  soft-tissue]
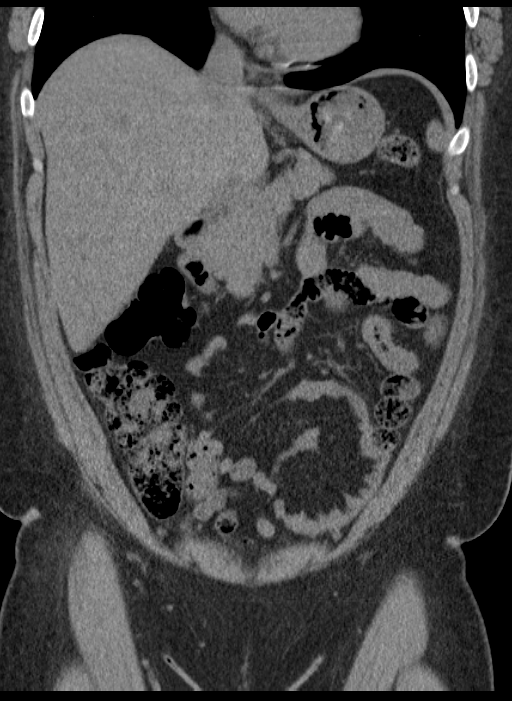
[im 48/86  soft-tissue]
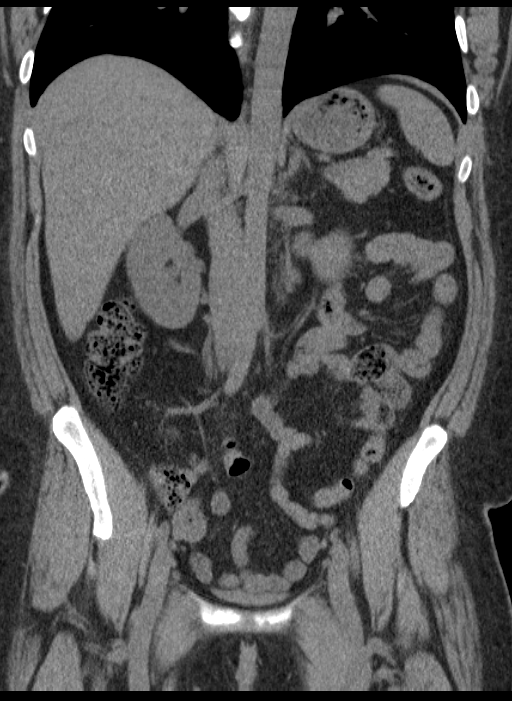

[16 of 46 positions shown; findings below may reference images not displayed]

FINDINGS: The lung bases are clear.  No pleural effusion.

The unenhanced appearance of the liver is unremarkable.  No focal
lesions or intrahepatic biliary dilatation to the gallbladder
surgically absent.  No common bile duct dilatation.  The pancreas
is unremarkable.  The spleen is normal in size.  No focal lesions.
The adrenal glands are normal.  The right kidney demonstrates mild
edema and mild right-sided hydronephrosis and hydroureter.  There
is a 2 mm distal right ureteral calculus.  No bladder calculi.  No
left-sided ureteral calculi.

The stomach, duodenum, small bowel and colon are grossly normal
without oral contrast.  The appendix is normal.  No mesenteric or
retroperitoneal mass or adenopathy.  Small scattered lymph nodes
are noted.  The aorta is normal in caliber.  No atherosclerotic
changes.

The uterus and ovaries are normal.  The bladder is normal.  No
pelvic mass, adenopathy or free pelvic fluid collections.  No
inguinal mass or hernia.

The bony structures are unremarkable.
IMPRESSION: 2 mm distal right ureteral calculus causing mild right-sided
hydroureteronephrosis.

## 2013-07-01 ENCOUNTER — Encounter (HOSPITAL_BASED_OUTPATIENT_CLINIC_OR_DEPARTMENT_OTHER): Payer: Self-pay | Admitting: Emergency Medicine

## 2013-07-01 ENCOUNTER — Emergency Department (HOSPITAL_BASED_OUTPATIENT_CLINIC_OR_DEPARTMENT_OTHER)
Admission: EM | Admit: 2013-07-01 | Discharge: 2013-07-01 | Disposition: A | Discharge status: Home / Self Care | Payer: Self-pay | Attending: Emergency Medicine | Admitting: Emergency Medicine

## 2013-07-01 DIAGNOSIS — H6092 Unspecified otitis externa, left ear: Secondary | ICD-10-CM

## 2013-07-01 DIAGNOSIS — Z8742 Personal history of other diseases of the female genital tract: Secondary | ICD-10-CM | POA: Insufficient documentation

## 2013-07-01 DIAGNOSIS — Z79899 Other long term (current) drug therapy: Secondary | ICD-10-CM | POA: Insufficient documentation

## 2013-07-01 DIAGNOSIS — Z87442 Personal history of urinary calculi: Secondary | ICD-10-CM | POA: Insufficient documentation

## 2013-07-01 DIAGNOSIS — Z87891 Personal history of nicotine dependence: Secondary | ICD-10-CM | POA: Insufficient documentation

## 2013-07-01 DIAGNOSIS — H60399 Other infective otitis externa, unspecified ear: Secondary | ICD-10-CM | POA: Insufficient documentation

## 2013-07-01 MED ORDER — AMOXICILLIN 500 MG PO CAPS: 500.0000 mg | ORAL_CAPSULE | Freq: Three times a day (TID) | ORAL | Status: AC

## 2013-07-01 MED ORDER — NEOMYCIN-POLYMYXIN-HC 3.5-10000-1 OT SUSP: 4.0000 [drp] | Freq: Four times a day (QID) | OTIC | Status: DC

## 2013-07-01 NOTE — ED Provider Notes (Signed)
Medical screening examination/treatment/procedure(s) were performed by non-physician practitioner and as supervising physician I was immediately available for consultation/collaboration.    Kathalene Frames, MD 07/01/13 [phone]

## 2013-07-01 NOTE — Discharge Instructions (Signed)
Otitis Externa Otitis externa is a bacterial or fungal infection of the outer ear canal. This is the area from the eardrum to the outside of the ear. Otitis externa is sometimes called "swimmer's ear." CAUSES  Possible causes of infection include:  Swimming in dirty water.  Moisture remaining in the ear after swimming or bathing.  Mild injury (trauma) to the ear.  Objects stuck in the ear (foreign body).  Cuts or scrapes (abrasions) on the outside of the ear. SYMPTOMS  The first symptom of infection is often itching in the ear canal. Later signs and symptoms may include swelling and redness of the ear canal, ear pain, and yellowish-white fluid (pus) coming from the ear. The ear pain may be worse when pulling on the earlobe. DIAGNOSIS  Your caregiver will perform a physical exam. A sample of fluid may be taken from the ear and examined for bacteria or fungi. TREATMENT  Antibiotic ear drops are often given for 10 to 14 days. Treatment may also include pain medicine or corticosteroids to reduce itching and swelling. PREVENTION   Keep your ear dry. Use the corner of a towel to absorb water out of the ear canal after swimming or bathing.  Avoid scratching or putting objects inside your ear. This can damage the ear canal or remove the protective wax that lines the canal. This makes it easier for bacteria and fungi to grow.  Avoid swimming in lakes, polluted water, or poorly chlorinated pools.  You may use ear drops made of rubbing alcohol and vinegar after swimming. Combine equal parts of white vinegar and alcohol in a bottle. Put 3 or 4 drops into each ear after swimming. HOME CARE INSTRUCTIONS   Apply antibiotic ear drops to the ear canal as prescribed by your caregiver.  Only take over-the-counter or prescription medicines for pain, discomfort, or fever as directed by your caregiver.  If you have diabetes, follow any additional treatment instructions from your caregiver.  Keep all  follow-up appointments as directed by your caregiver. SEEK MEDICAL CARE IF:   You have a fever.  Your ear is still red, swollen, painful, or draining pus after 3 days.  Your redness, swelling, or pain gets worse.  You have a severe headache.  You have redness, swelling, pain, or tenderness in the area behind your ear. MAKE SURE YOU:   Understand these instructions.  Will watch your condition.  Will get help right away if you are not doing well or get worse. Document Released: 02/12/2005 Document Revised: 05/07/2011 Document Reviewed: 03/01/2011 Exit[employer] Patient Information 2014 [employer].  

## 2013-07-01 NOTE — ED Notes (Signed)
Left earache x 3 days 

## 2013-07-01 NOTE — ED Provider Notes (Signed)
CSN: [id number]     Arrival date & time 07/01/13  2006 History   First MD Initiated Contact with Patient 07/01/13 2022     Chief Complaint  Patient presents with  . Otalgia     (Consider location/radiation/quality/duration/timing/severity/associated sxs/prior Treatment) Patient is a 33 y.o. female presenting with ear pain. The history is provided by the patient. No language interpreter was used.  Otalgia Location:  Left Behind ear:  No abnormality Quality:  Aching Severity:  Moderate Onset quality:  Gradual Duration:  3 days Timing:  Constant Progression:  Worsening Chronicity:  New Relieved by:  Nothing Worsened by:  Nothing tried Ineffective treatments:  None tried Associated symptoms: no ear discharge     Past Medical History  Diagnosis Date  . Irregular periods   . Kidney stone    Past Surgical History  Procedure Laterality Date  . Cholecystectomy     No family history on file. History  Substance Use Topics  . Smoking status: Former Research scientist (life sciences)  . Smokeless tobacco: Not on file  . Alcohol Use: Yes     Comment: socially    OB History   Grav Para Term Preterm Abortions TAB SAB Ect Mult Living                 Review of Systems  HENT: Positive for ear pain. Negative for ear discharge.   All other systems reviewed and are negative.     Allergies  Sulfa drugs cross reactors  Home Medications   Prior to Admission medications   Medication Sig Start Date End Date Taking? Authorizing Provider  amoxicillin (AMOXIL) 500 MG capsule Take 1 capsule (500 mg total) by mouth 3 (three) times daily. 08/21/12   Blanchie Dessert, MD  amoxicillin (AMOXIL) 500 MG capsule Take 1 capsule (500 mg total) by mouth 3 (three) times daily. 07/01/13 07/11/13  Fransico Meadow, PA-C  cetirizine-pseudoephedrine (ZYRTEC-D) 5-120 MG per tablet Take 1 tablet by mouth daily. 08/21/12   Blanchie Dessert, MD  neomycin-polymyxin-hydrocortisone (CORTISPORIN) 3.5-10000-1 otic suspension Place 4 drops  into the left ear 4 (four) times daily. 07/01/13   Fransico Meadow, PA-C  Norethin-Eth Estrad Triphasic (TRI-NORINYL, 28, PO) Take 1 tablet by mouth daily.    Historical Provider, MD  promethazine (PHENERGAN) 25 MG tablet Take 1 tablet (25 mg total) by mouth every 6 (six) hours as needed for nausea. 08/01/11 08/08/11  Anne Ng, PA-C  Tamsulosin HCl (FLOMAX) 0.4 MG CAPS Take 1 capsule (0.4 mg total) by mouth daily. 5/36/14   Delora Fuel, MD   BP 431/54  Pulse 64  Temp(Src) 98.6 F (37 C) (Oral)  Resp 20  Ht 5\' 6"  (1.676 m)  Wt 250 lb (113.399 kg)  BMI 40.37 kg/m2  SpO2 100% Physical Exam  Constitutional: She appears well-developed and well-nourished.  HENT:  Head: Normocephalic.  Mouth/Throat: Oropharynx is clear and moist.  Left canal swollen, drainage  Eyes: Conjunctivae are normal. Pupils are equal, round, and reactive to light.  Neck: Normal range of motion. Neck supple.  Cardiovascular: Normal rate and normal heart sounds.   Pulmonary/Chest: Effort normal and breath sounds normal.  Abdominal: Soft.  Musculoskeletal: Normal range of motion.  Neurological: She is alert.  Skin: Skin is warm.    ED Course  Procedures (including critical care time) Labs Review Labs Reviewed - No data to display  Imaging Review No results found.   EKG Interpretation None      MDM   Final diagnoses:  External otitis of  left ear    [place], PA-C 07/01/13 2131

## 2013-09-22 ENCOUNTER — Emergency Department (HOSPITAL_BASED_OUTPATIENT_CLINIC_OR_DEPARTMENT_OTHER)
Admission: EM | Admit: 2013-09-22 | Discharge: 2013-09-22 | Disposition: A | Discharge status: Home / Self Care | Payer: Self-pay | Attending: Emergency Medicine | Admitting: Emergency Medicine

## 2013-09-22 ENCOUNTER — Encounter (HOSPITAL_BASED_OUTPATIENT_CLINIC_OR_DEPARTMENT_OTHER): Payer: Self-pay | Admitting: Emergency Medicine

## 2013-09-22 DIAGNOSIS — Z79899 Other long term (current) drug therapy: Secondary | ICD-10-CM | POA: Insufficient documentation

## 2013-09-22 DIAGNOSIS — N390 Urinary tract infection, site not specified: Secondary | ICD-10-CM | POA: Insufficient documentation

## 2013-09-22 DIAGNOSIS — Z87891 Personal history of nicotine dependence: Secondary | ICD-10-CM | POA: Insufficient documentation

## 2013-09-22 DIAGNOSIS — Z3202 Encounter for pregnancy test, result negative: Secondary | ICD-10-CM | POA: Insufficient documentation

## 2013-09-22 DIAGNOSIS — IMO0002 Reserved for concepts with insufficient information to code with codable children: Secondary | ICD-10-CM | POA: Insufficient documentation

## 2013-09-22 DIAGNOSIS — Z87442 Personal history of urinary calculi: Secondary | ICD-10-CM | POA: Insufficient documentation

## 2013-09-22 DIAGNOSIS — Z8742 Personal history of other diseases of the female genital tract: Secondary | ICD-10-CM | POA: Insufficient documentation

## 2013-09-22 DIAGNOSIS — Z792 Long term (current) use of antibiotics: Secondary | ICD-10-CM | POA: Insufficient documentation

## 2013-09-22 LAB — URINALYSIS, ROUTINE W REFLEX MICROSCOPIC
BILIRUBIN URINE: NEGATIVE
GLUCOSE, UA: NEGATIVE mg/dL
Ketones, ur: NEGATIVE mg/dL
Nitrite: NEGATIVE
PROTEIN: NEGATIVE mg/dL
Specific Gravity, Urine: 1.015 (ref 1.005–1.030)
Urobilinogen, UA: 0.2 mg/dL (ref 0.0–1.0)
pH: 7.5 (ref 5.0–8.0)

## 2013-09-22 LAB — URINE MICROSCOPIC-ADD ON

## 2013-09-22 LAB — PREGNANCY, URINE: PREG TEST UR: NEGATIVE

## 2013-09-22 MED ORDER — CEPHALEXIN 500 MG PO CAPS: 500.0000 mg | ORAL_CAPSULE | Freq: Four times a day (QID) | ORAL | Status: DC

## 2013-09-22 NOTE — ED Notes (Signed)
Reports frequency, dysuria x 1 week. No nausea. Been using UZO with no relief.

## 2013-09-22 NOTE — Discharge Instructions (Signed)

## 2013-09-22 NOTE — ED Provider Notes (Signed)
CSN: [id number]     Arrival date & time 09/22/13  1258 History   First MD Initiated Contact with Patient 09/22/13 1339     Chief Complaint  Patient presents with  . Urinary Tract Infection     (Consider location/radiation/quality/duration/timing/severity/associated sxs/prior Treatment) Patient is a 33 y.o. female presenting with urinary tract infection. The history is provided by the patient. No language interpreter was used.  Urinary Tract Infection This is a new problem. The current episode started today. The problem occurs constantly. The problem has been gradually worsening. Pertinent negatives include no abdominal pain or fever. Nothing aggravates the symptoms. She has tried nothing for the symptoms. The treatment provided moderate relief.    Past Medical History  Diagnosis Date  . Irregular periods   . Kidney stone    Past Surgical History  Procedure Laterality Date  . Cholecystectomy     No family history on file. History  Substance Use Topics  . Smoking status: Former Research scientist (life sciences)  . Smokeless tobacco: Not on file  . Alcohol Use: Yes     Comment: socially    OB History   Grav Para Term Preterm Abortions TAB SAB Ect Mult Living                 Review of Systems  Constitutional: Negative for fever.  Gastrointestinal: Negative for abdominal pain.  All other systems reviewed and are negative.     Allergies  Sulfa drugs cross reactors  Home Medications   Prior to Admission medications   Medication Sig Start Date End Date Taking? Authorizing Provider  amoxicillin (AMOXIL) 500 MG capsule Take 1 capsule (500 mg total) by mouth 3 (three) times daily. 08/21/12   Blanchie Dessert, MD  cetirizine-pseudoephedrine (ZYRTEC-D) 5-120 MG per tablet Take 1 tablet by mouth daily. 08/21/12   Blanchie Dessert, MD  neomycin-polymyxin-hydrocortisone (CORTISPORIN) 3.5-10000-1 otic suspension Place 4 drops into the left ear 4 (four) times daily. 07/01/13   Fransico Meadow, PA-C   Norethin-Eth Estrad Triphasic (TRI-NORINYL, 28, PO) Take 1 tablet by mouth daily.    Historical Provider, MD  promethazine (PHENERGAN) 25 MG tablet Take 1 tablet (25 mg total) by mouth every 6 (six) hours as needed for nausea. 08/01/11 08/08/11  Anne Ng, PA-C  Tamsulosin HCl (FLOMAX) 0.4 MG CAPS Take 1 capsule (0.4 mg total) by mouth daily. 9/83/38   Delora Fuel, MD   BP 250/53  Pulse 62  Temp(Src) 98.4 F (36.9 C) (Oral)  Resp 16  Ht 5\' 6"  (1.676 m)  Wt 243 lb 11.2 oz (110.542 kg)  BMI 39.35 kg/m2  SpO2 100%  LMP 08/04/2013 Physical Exam  Nursing note and vitals reviewed. Constitutional: She is oriented to person, place, and time. She appears well-developed and well-nourished.  HENT:  Head: Normocephalic and atraumatic.  Eyes: Pupils are equal, round, and reactive to light.  Neck: Normal range of motion.  Cardiovascular: Normal rate and regular rhythm.   Pulmonary/Chest: Effort normal and breath sounds normal.  Abdominal: Soft. Bowel sounds are normal.  Musculoskeletal: Normal range of motion.  Neurological: She is alert and oriented to person, place, and time.  Skin: Skin is warm.  Psychiatric: She has a normal mood and affect.    ED Course  Procedures (including critical care time) Labs Review Labs Reviewed  URINALYSIS, ROUTINE W REFLEX MICROSCOPIC - Abnormal; Notable for the following:    APPearance CLOUDY (*)    Hgb urine dipstick SMALL (*)    Leukocytes, UA LARGE (*)  All other components within normal limits  URINE MICROSCOPIC-ADD ON - Abnormal; Notable for the following:    Squamous Epithelial / LPF FEW (*)    Bacteria, UA FEW (*)    All other components within normal limits  PREGNANCY, URINE    Imaging Review No results found.   EKG Interpretation None      MDM   Final diagnoses:  None    Results for orders placed during the hospital encounter of 09/22/13  URINALYSIS, ROUTINE W REFLEX MICROSCOPIC      Result Value Ref Range   Color, Urine  YELLOW  YELLOW   APPearance CLOUDY (*) CLEAR   Specific Gravity, Urine 1.015  1.005 - 1.030   pH 7.5  5.0 - 8.0   Glucose, UA NEGATIVE  NEGATIVE mg/dL   Hgb urine dipstick SMALL (*) NEGATIVE   Bilirubin Urine NEGATIVE  NEGATIVE   Ketones, ur NEGATIVE  NEGATIVE mg/dL   Protein, ur NEGATIVE  NEGATIVE mg/dL   Urobilinogen, UA 0.2  0.0 - 1.0 mg/dL   Nitrite NEGATIVE  NEGATIVE   Leukocytes, UA LARGE (*) NEGATIVE  PREGNANCY, URINE      Result Value Ref Range   Preg Test, Ur NEGATIVE  NEGATIVE  URINE MICROSCOPIC-ADD ON      Result Value Ref Range   Squamous Epithelial / LPF FEW (*) RARE   WBC, UA TOO NUMEROUS TO COUNT  <3 WBC/hpf   RBC / HPF 3-6  <3 RBC/hpf   Bacteria, UA FEW (*) RARE   Urine-Other MUCOUS PRESENT     No results found. Keflex rx   Fransico Meadow, PA-C 09/22/13 [place], PA-C 09/22/13 1437

## 2013-09-24 NOTE — ED Provider Notes (Signed)
Medical screening examination/treatment/procedure(s) were performed by non-physician practitioner and as supervising physician I was immediately available for consultation/collaboration.   EKG Interpretation None       Threasa Beards, MD 09/24/13 [phone]

## 2014-09-10 ENCOUNTER — Emergency Department (HOSPITAL_BASED_OUTPATIENT_CLINIC_OR_DEPARTMENT_OTHER): Payer: Self-pay

## 2014-09-10 ENCOUNTER — Encounter (HOSPITAL_BASED_OUTPATIENT_CLINIC_OR_DEPARTMENT_OTHER): Payer: Self-pay | Admitting: *Deleted

## 2014-09-10 ENCOUNTER — Emergency Department (HOSPITAL_BASED_OUTPATIENT_CLINIC_OR_DEPARTMENT_OTHER)
Admission: EM | Admit: 2014-09-10 | Discharge: 2014-09-10 | Disposition: A | Discharge status: Home / Self Care | Payer: Self-pay | Attending: Emergency Medicine | Admitting: Emergency Medicine

## 2014-09-10 DIAGNOSIS — Z792 Long term (current) use of antibiotics: Secondary | ICD-10-CM | POA: Insufficient documentation

## 2014-09-10 DIAGNOSIS — S6992XA Unspecified injury of left wrist, hand and finger(s), initial encounter: Secondary | ICD-10-CM | POA: Insufficient documentation

## 2014-09-10 DIAGNOSIS — Z87442 Personal history of urinary calculi: Secondary | ICD-10-CM | POA: Insufficient documentation

## 2014-09-10 DIAGNOSIS — Y9289 Other specified places as the place of occurrence of the external cause: Secondary | ICD-10-CM | POA: Insufficient documentation

## 2014-09-10 DIAGNOSIS — Z79899 Other long term (current) drug therapy: Secondary | ICD-10-CM | POA: Insufficient documentation

## 2014-09-10 DIAGNOSIS — Y998 Other external cause status: Secondary | ICD-10-CM | POA: Insufficient documentation

## 2014-09-10 DIAGNOSIS — Z8742 Personal history of other diseases of the female genital tract: Secondary | ICD-10-CM | POA: Insufficient documentation

## 2014-09-10 DIAGNOSIS — Z793 Long term (current) use of hormonal contraceptives: Secondary | ICD-10-CM | POA: Insufficient documentation

## 2014-09-10 DIAGNOSIS — M79642 Pain in left hand: Secondary | ICD-10-CM

## 2014-09-10 DIAGNOSIS — Z87891 Personal history of nicotine dependence: Secondary | ICD-10-CM | POA: Insufficient documentation

## 2014-09-10 DIAGNOSIS — Y9389 Activity, other specified: Secondary | ICD-10-CM | POA: Insufficient documentation

## 2014-09-10 MED ORDER — IBUPROFEN 800 MG PO TABS: 800.0000 mg | ORAL_TABLET | Freq: Three times a day (TID) | ORAL | Status: DC
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
## 2014-09-10 NOTE — ED Notes (Signed)
MD at bedside. 
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
## 2014-09-10 NOTE — Discharge Instructions (Signed)
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
## 2016-03-07 ENCOUNTER — Encounter (HOSPITAL_BASED_OUTPATIENT_CLINIC_OR_DEPARTMENT_OTHER): Payer: Self-pay

## 2016-03-07 ENCOUNTER — Emergency Department (HOSPITAL_BASED_OUTPATIENT_CLINIC_OR_DEPARTMENT_OTHER)
Admission: EM | Admit: 2016-03-07 | Discharge: 2016-03-07 | Disposition: A | Discharge status: Home / Self Care | Payer: Self-pay | Attending: Emergency Medicine | Admitting: Emergency Medicine

## 2016-03-07 DIAGNOSIS — E860 Dehydration: Secondary | ICD-10-CM | POA: Insufficient documentation

## 2016-03-07 DIAGNOSIS — Z79899 Other long term (current) drug therapy: Secondary | ICD-10-CM | POA: Insufficient documentation

## 2016-03-07 DIAGNOSIS — Z87891 Personal history of nicotine dependence: Secondary | ICD-10-CM | POA: Insufficient documentation

## 2016-03-07 DIAGNOSIS — H9201 Otalgia, right ear: Secondary | ICD-10-CM | POA: Insufficient documentation

## 2016-03-07 LAB — CBC WITH DIFFERENTIAL/PLATELET
BASOS ABS: 0 10*3/uL (ref 0.0–0.1)
Basophils Relative: 0 %
Eosinophils Absolute: 0.1 10*3/uL (ref 0.0–0.7)
Eosinophils Relative: 1 %
HCT: 37.7 % (ref 36.0–46.0)
HEMOGLOBIN: 12.5 g/dL (ref 12.0–15.0)
LYMPHS PCT: 30 %
Lymphs Abs: 2.8 10*3/uL (ref 0.7–4.0)
MCH: 30.8 pg (ref 26.0–34.0)
MCHC: 33.2 g/dL (ref 30.0–36.0)
MCV: 92.9 fL (ref 78.0–100.0)
Monocytes Absolute: 0.6 10*3/uL (ref 0.1–1.0)
Monocytes Relative: 7 %
NEUTROS ABS: 5.7 10*3/uL (ref 1.7–7.7)
NEUTROS PCT: 62 %
PLATELETS: 358 10*3/uL (ref 150–400)
RBC: 4.06 MIL/uL (ref 3.87–5.11)
RDW: 12.9 % (ref 11.5–15.5)
WBC: 9.2 10*3/uL (ref 4.0–10.5)

## 2016-03-07 LAB — URINALYSIS, ROUTINE W REFLEX MICROSCOPIC
Bilirubin Urine: NEGATIVE
GLUCOSE, UA: NEGATIVE mg/dL
Hgb urine dipstick: NEGATIVE
Ketones, ur: NEGATIVE mg/dL
LEUKOCYTES UA: NEGATIVE
Nitrite: NEGATIVE
PROTEIN: NEGATIVE mg/dL
Specific Gravity, Urine: 1.022 (ref 1.005–1.030)
pH: 8.5 — ABNORMAL HIGH (ref 5.0–8.0)

## 2016-03-07 LAB — RAPID URINE DRUG SCREEN, HOSP PERFORMED
AMPHETAMINES: NOT DETECTED
BENZODIAZEPINES: NOT DETECTED
Barbiturates: NOT DETECTED
Cocaine: NOT DETECTED
Opiates: NOT DETECTED
Tetrahydrocannabinol: POSITIVE — AB

## 2016-03-07 LAB — COMPREHENSIVE METABOLIC PANEL
ALT: 21 U/L (ref 14–54)
ANION GAP: 7 (ref 5–15)
AST: 20 U/L (ref 15–41)
Albumin: 3.7 g/dL (ref 3.5–5.0)
Alkaline Phosphatase: 34 U/L — ABNORMAL LOW (ref 38–126)
BILIRUBIN TOTAL: 0.4 mg/dL (ref 0.3–1.2)
BUN: 9 mg/dL (ref 6–20)
CO2: 26 mmol/L (ref 22–32)
Calcium: 9.1 mg/dL (ref 8.9–10.3)
Chloride: 102 mmol/L (ref 101–111)
Creatinine, Ser: 0.78 mg/dL (ref 0.44–1.00)
GFR calc Af Amer: >60 mL/min (ref >60)
GFR calc non Af Amer: >60 mL/min (ref >60)
Glucose, Bld: 94 mg/dL (ref 65–99)
POTASSIUM: 3.9 mmol/L (ref 3.5–5.1)
Sodium: 135 mmol/L (ref 135–145)
TOTAL PROTEIN: 7.6 g/dL (ref 6.5–8.1)

## 2016-03-07 MED ORDER — SODIUM CHLORIDE 0.9 % IV BOLUS (SEPSIS)
1000.0000 mL | Freq: Once | INTRAVENOUS | Status: AC
  Administered 2016-03-07: 1000 mL via INTRAVENOUS

## 2016-03-07 MED ORDER — SODIUM CHLORIDE 0.9 % IV SOLN: Freq: Once | INTRAVENOUS | Status: DC

## 2016-03-07 NOTE — ED Triage Notes (Signed)
C/o dizziness, unsteady gait x 69min-woke with HA this am hearing loss to right ear x 2-3 weeks-states hearing "is starting to come back"-presents to triage in w/c

## 2016-03-07 NOTE — ED Provider Notes (Signed)
[facility] DEPT [facility] Provider Note   CSN: [id number] Arrival date & time: 03/07/16  1526  By signing my name below, I, Jeanell Sparrow, attest that this documentation has been prepared under the direction and in the presence of non-physician practitioner, Arlean Hopping, PA-C. Electronically Signed: Jeanell Sparrow, Scribe. 03/07/2016. 4:05 PM.  History   Chief Complaint Chief Complaint  Patient presents with  . Dizziness   The history is provided by the patient. No language interpreter was used.   HPI Comments: Colleen Downs is a 36 y.o. female who presents to the Emergency Department complaining of constant moderate frontal headache that started this morning. She states she has had headache, right ear pain, and intermittent right ear congestion prior to today since starting her new, high-stress, highly physical job a little over a 1 week ago. She has been working two jobs, goes without eating for 24-hour timespans, sweats profusely while she is working, gets very little sleep, and feels overstressed. Patient states that a week ago they moved her to a more physically demanding job at [employer]. Patient states the sweating is not new for her and occurs with hard labor. She was seen at [facility] for the same complaint and she was diagnosed with dehydration a week ago. She had a recurrent onset of headache with associated lightheadedness today. She states she has not changed her routine or oral intake habits since her evaluation a week ago. Also complains of occasional nausea and lightheadedness. Patient states she has not eaten anything for the last 24 hours. She endorses having similar headaches in other symptoms over the past year since she has been working her 2 jobs. Denies fever/chills, vomiting, neuro deficits, chest pain, shortness of breath, or any other complaints.     Past Medical History:  Diagnosis Date  . Irregular periods   . Kidney stone     There are no active problems to  display for this patient.   Past Surgical History:  Procedure Laterality Date  . CHOLECYSTECTOMY      OB History    No data available       Home Medications    Prior to Admission medications   Medication Sig Start Date End Date Taking? Authorizing Provider  amoxicillin (AMOXIL) 500 MG capsule Take 1 capsule (500 mg total) by mouth 3 (three) times daily. 08/21/12   Blanchie Dessert, MD  cephALEXin (KEFLEX) 500 MG capsule Take 1 capsule (500 mg total) by mouth 4 (four) times daily. 09/22/13   Fransico Meadow, PA-C  cetirizine-pseudoephedrine (ZYRTEC-D) 5-120 MG per tablet Take 1 tablet by mouth daily. 08/21/12   Blanchie Dessert, MD  ibuprofen (ADVIL,MOTRIN) 800 MG tablet Take 1 tablet (800 mg total) by mouth 3 (three) times daily. 09/10/14   Evelina Bucy, MD  neomycin-polymyxin-hydrocortisone (CORTISPORIN) 3.5-10000-1 otic suspension Place 4 drops into the left ear 4 (four) times daily. 07/01/13   Fransico Meadow, PA-C  Norethin-Eth Estrad Triphasic (TRI-NORINYL, 28, PO) Take 1 tablet by mouth daily.    Historical Provider, MD  promethazine (PHENERGAN) 25 MG tablet Take 1 tablet (25 mg total) by mouth every 6 (six) hours as needed for nausea. 08/01/11 08/08/11  Anne Ng, PA-C  Tamsulosin HCl (FLOMAX) 0.4 MG CAPS Take 1 capsule (0.4 mg total) by mouth daily. [id number]   Delora Fuel, MD    Family History No family history on file.  Social History Social History  Substance Use Topics  . Smoking status: Former Research scientist (life sciences)  . Smokeless tobacco: Never Used  .  Alcohol use Yes     Comment: occ     Allergies   Sulfa drugs cross reactors   Review of Systems Review of Systems  Constitutional: Negative for chills and fever.  HENT: Positive for ear pain (Right sided).   Respiratory: Negative for shortness of breath.   Cardiovascular: Negative for chest pain.  Gastrointestinal: Positive for nausea. Negative for abdominal pain and vomiting.  Neurological: Positive for light-headedness and  headaches.  All other systems reviewed and are negative.    Physical Exam Updated Vital Signs BP 133/72 (BP Location: Left Arm)   Pulse 60   Temp 98.7 F (37.1 C) (Oral)   Resp 20   Ht 5\' 6"  (1.676 m)   Wt 240 lb (108.9 kg)   LMP 01/13/2016   SpO2 100%   BMI 38.74 kg/m   Physical Exam  Constitutional: She is oriented to person, place, and time. She appears well-developed and well-nourished. No distress.  HENT:  Head: Normocephalic and atraumatic.  Cerumen in the right ear canal almost fully obstructing the TM.  Eyes: Conjunctivae and EOM are normal. Pupils are equal, round, and reactive to light.  Neck: Normal range of motion. Neck supple.  Cardiovascular: Normal rate, regular rhythm, normal heart sounds and intact distal pulses.   Pulmonary/Chest: Effort normal and breath sounds normal. No respiratory distress.  Abdominal: Soft. There is no tenderness. There is no guarding.  Musculoskeletal: She exhibits no edema.  Full ROM in all extremities and spine. No paraspinal tenderness.   Lymphadenopathy:    She has no cervical adenopathy.  Neurological: She is alert and oriented to person, place, and time.  No sensory deficits. Strength 5/5 in all extremities. No gait disturbance. Coordination intact, including heel to shin and finger to nose.. Cranial nerves III-XII grossly intact. No facial droop. HINTS exam is reassuring.  Skin: Skin is warm and dry. She is not diaphoretic.  Psychiatric: She has a normal mood and affect. Her behavior is normal.  Nursing note and vitals reviewed.    ED Treatments / Results  DIAGNOSTIC STUDIES: Oxygen Saturation is 100% on RA, normal by my interpretation.    COORDINATION OF CARE: 4:10 PM- Pt advised of plan for treatment and pt agrees.  Labs (all labs ordered are listed, but only abnormal results are displayed) Labs Reviewed  COMPREHENSIVE METABOLIC PANEL - Abnormal; Notable for the following:       Result Value   Alkaline Phosphatase  34 (*)    All other components within normal limits  URINALYSIS, ROUTINE W REFLEX MICROSCOPIC - Abnormal; Notable for the following:    APPearance CLOUDY (*)    pH 8.5 (*)    All other components within normal limits  RAPID URINE DRUG SCREEN, HOSP PERFORMED - Abnormal; Notable for the following:    Tetrahydrocannabinol POSITIVE (*)    All other components within normal limits  CBC WITH DIFFERENTIAL/PLATELET    EKG  EKG Interpretation None       Radiology No results found.  Procedures Procedures (including critical care time)  Medications Ordered in ED Medications  sodium chloride 0.9 % bolus 1,000 mL (0 mLs Intravenous Stopped 03/07/16 1749)    Followed by  0.9 %  sodium chloride infusion (not administered)     Initial Impression / Assessment and Plan / ED Course  I have reviewed the triage vital signs and the nursing notes.  Pertinent labs & imaging results that were available during my care of the patient were reviewed by me and  considered in my medical decision making (see chart for details).  Clinical Course     Patient presents with headache, nausea, and lightheadedness. Patient is nontoxic appearing and has a normal neuro exam. Vital signs are within normal limits. Patient's symptoms are consistent with the patient's lifestyle. Suspect dehydration, poor oral intake, stress, and lack of sleep are all contributing. Patient was counseled on this matter. 5:48 PM Upon reassessment, patient voices complete resolution of her symptoms. Further home care and return precautions discussed. Patient voiced understanding of all instructions and is comfortable with discharge.    Vitals:   03/07/16 1700 03/07/16 1715 03/07/16 1745 03/07/16 1800  BP: 113/70 126/66  122/68  Pulse: 67 (!) 59 (!) 57 (!) 57  Resp: 21 10 14 14   Temp:      TempSrc:      SpO2: 96% 100% 100% 100%  Weight:      Height:         Final Clinical Impressions(s) / ED Diagnoses   Final diagnoses:    Dehydration    New Prescriptions Discharge Medication List as of 03/07/2016  6:03 PM     I personally performed the services described in this documentation, which was scribed in my presence. The recorded information has been reviewed and is accurate.    Lorayne Bender, PA-C 03/07/16 1824    Merrily Pew, MD 03/08/16 [id number]

## 2016-03-07 NOTE — ED Notes (Signed)
ED Provider at bedside. 

## 2016-03-07 NOTE — Discharge Instructions (Signed)
Your lab work today showed no significant abnormalities. Your symptoms today may be due to a combination of stress, dehydration, and poor eating habits. Please make sure to drink at least a half a liter of water in our. Mix in electrolyte drinks, such as Gatorade or Powerade. Be sure to eat small meals and snacks throughout the day. A goal of at least 8 hours of sleep a night is optimal for good health. Follow up with a primary care provider for any further management. Return to ED should any symptoms worsen.

## 2016-07-30 ENCOUNTER — Emergency Department (HOSPITAL_BASED_OUTPATIENT_CLINIC_OR_DEPARTMENT_OTHER): Payer: Self-pay

## 2016-07-30 ENCOUNTER — Emergency Department (HOSPITAL_BASED_OUTPATIENT_CLINIC_OR_DEPARTMENT_OTHER)
Admission: EM | Admit: 2016-07-30 | Discharge: 2016-07-30 | Disposition: A | Discharge status: Home / Self Care | Payer: Self-pay | Attending: Emergency Medicine | Admitting: Emergency Medicine

## 2016-07-30 ENCOUNTER — Encounter (HOSPITAL_BASED_OUTPATIENT_CLINIC_OR_DEPARTMENT_OTHER): Payer: Self-pay | Admitting: Emergency Medicine

## 2016-07-30 DIAGNOSIS — R079 Chest pain, unspecified: Secondary | ICD-10-CM | POA: Insufficient documentation

## 2016-07-30 DIAGNOSIS — Z87891 Personal history of nicotine dependence: Secondary | ICD-10-CM | POA: Insufficient documentation

## 2016-07-30 DIAGNOSIS — Z79899 Other long term (current) drug therapy: Secondary | ICD-10-CM | POA: Insufficient documentation

## 2016-07-30 DIAGNOSIS — R002 Palpitations: Secondary | ICD-10-CM | POA: Insufficient documentation

## 2016-07-30 DIAGNOSIS — M7989 Other specified soft tissue disorders: Secondary | ICD-10-CM | POA: Insufficient documentation

## 2016-07-30 LAB — COMPREHENSIVE METABOLIC PANEL
ALK PHOS: 30 U/L — AB (ref 38–126)
ALT: 20 U/L (ref 14–54)
AST: 27 U/L (ref 15–41)
Albumin: 3.6 g/dL (ref 3.5–5.0)
Anion gap: 8 (ref 5–15)
BUN: 8 mg/dL (ref 6–20)
CALCIUM: 8.6 mg/dL — AB (ref 8.9–10.3)
CHLORIDE: 102 mmol/L (ref 101–111)
CO2: 24 mmol/L (ref 22–32)
CREATININE: 0.91 mg/dL (ref 0.44–1.00)
GFR calc non Af Amer: >60 mL/min (ref >60)
Glucose, Bld: 149 mg/dL — ABNORMAL HIGH (ref 65–99)
Potassium: 3.5 mmol/L (ref 3.5–5.1)
SODIUM: 134 mmol/L — AB (ref 135–145)
Total Bilirubin: 0.5 mg/dL (ref 0.3–1.2)
Total Protein: 7.4 g/dL (ref 6.5–8.1)

## 2016-07-30 LAB — CBC WITH DIFFERENTIAL/PLATELET
Basophils Absolute: 0 10*3/uL (ref 0.0–0.1)
Basophils Relative: 0 %
EOS ABS: 0 10*3/uL (ref 0.0–0.7)
EOS PCT: 1 %
HCT: 35.6 % — ABNORMAL LOW (ref 36.0–46.0)
Hemoglobin: 12 g/dL (ref 12.0–15.0)
LYMPHS ABS: 2 10*3/uL (ref 0.7–4.0)
LYMPHS PCT: 29 %
MCH: 30.7 pg (ref 26.0–34.0)
MCHC: 33.7 g/dL (ref 30.0–36.0)
MCV: 91 fL (ref 78.0–100.0)
MONO ABS: 0.4 10*3/uL (ref 0.1–1.0)
MONOS PCT: 6 %
Neutro Abs: 4.3 10*3/uL (ref 1.7–7.7)
Neutrophils Relative %: 64 %
PLATELETS: 289 10*3/uL (ref 150–400)
RBC: 3.91 MIL/uL (ref 3.87–5.11)
RDW: 13.1 % (ref 11.5–15.5)
WBC: 6.7 10*3/uL (ref 4.0–10.5)

## 2016-07-30 LAB — PREGNANCY, URINE: PREG TEST UR: NEGATIVE

## 2016-07-30 LAB — TROPONIN I: Troponin I: <0.03 ng/mL (ref <0.03)

## 2016-07-30 MED ORDER — ACETAMINOPHEN 500 MG PO TABS
1000.0000 mg | ORAL_TABLET | Freq: Once | ORAL | Status: AC
  Administered 2016-07-30: 1000 mg via ORAL
  Filled 2016-07-30: qty 2

## 2016-07-30 NOTE — ED Notes (Addendum)
Patient stated that her right leg swells more as the day progresses.  She stated, "hurts when I stand up for 3-4 hours."  She felt numbness all the way to her right lateral thigh."

## 2016-07-30 NOTE — ED Provider Notes (Signed)
[facility] Provider Note   CSN: [id number] Arrival date & time: 07/30/16  1612  By signing my name below, I, Jeanell Sparrow, attest that this documentation has been prepared under the direction and in the presence of Sherwood Gambler, MD. Electronically Signed: Jeanell Sparrow, Scribe. 07/30/2016. 4:42 PM.  History   Chief Complaint Chief Complaint  Patient presents with  . Leg Swelling   The history is provided by the patient. No language interpreter was used.   HPI Comments: Colleen Downs is a 36 y.o. female who presents to the Emergency Department complaining of constant moderate right ankle and foot swelling that started about 3 days ago. She reports she is having swelling without any known recent cause or trauma. She used ice PTA without relief. She describes the associated pain from swelling as worsening and waxing/waning. 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 She states she recently had a 3-hour car ride, stress, unprotected intercourse, and hx of smoking. Her LNMP was 06/30/16. Denies any recent estrogen treatment, LLE swelling, SOB, gait problem, or other complaints at this time. She thinks the chest pain has been on and off for "a while" and is from stress at work.  Past Medical History:  Diagnosis Date  . Irregular periods   . Kidney stone     There are no active problems to display for this patient.   Past Surgical History:  Procedure Laterality Date  . CHOLECYSTECTOMY      OB History    No data available       Home Medications    Prior to Admission medications   Medication Sig Start Date End Date Taking? Authorizing Provider  amoxicillin (AMOXIL) 500 MG capsule Take 1 capsule (500 mg total) by mouth 3 (three) times daily. 08/21/12   Blanchie Dessert, MD  cephALEXin (KEFLEX) 500 MG capsule Take 1 capsule (500 mg total) by mouth 4 (four) times daily. 09/22/13   Fransico Meadow, PA-C    cetirizine-pseudoephedrine (ZYRTEC-D) 5-120 MG per tablet Take 1 tablet by mouth daily. 08/21/12   Blanchie Dessert, MD  ibuprofen (ADVIL,MOTRIN) 800 MG tablet Take 1 tablet (800 mg total) by mouth 3 (three) times daily. 09/10/14   Evelina Bucy, MD  neomycin-polymyxin-hydrocortisone (CORTISPORIN) 3.5-10000-1 otic suspension Place 4 drops into the left ear 4 (four) times daily. 07/01/13   Fransico Meadow, PA-C  Norethin-Eth Estrad Triphasic (TRI-NORINYL, 28, PO) Take 1 tablet by mouth daily.    [provider]  promethazine (PHENERGAN) 25 MG tablet Take 1 tablet (25 mg total) by mouth every 6 (six) hours as needed for nausea. 08/01/11 08/08/11  Hunt, Romelle Starcher, PA-C  Tamsulosin HCl (FLOMAX) 0.4 MG CAPS Take 1 capsule (0.4 mg total) by mouth daily. 6/62/94   Delora Fuel, MD    Family History History reviewed. No pertinent family history.  Social History Social History  Substance Use Topics  . Smoking status: Former Research scientist (life sciences)  . Smokeless tobacco: Never Used  . Alcohol use Yes     Comment: occ     Allergies   Sulfa drugs cross reactors   Review of Systems Review of Systems  Respiratory: Negative for shortness of breath.   Cardiovascular: Positive for chest pain, palpitations and leg swelling.  Musculoskeletal: Negative for gait problem.  All other systems reviewed and are negative.    Physical Exam Updated Vital Signs BP 120/72 (BP Location: Right Arm)   Pulse 65   Temp 98.4 F (36.9 C) (Oral)  Resp 18   Ht 5\' 6"  (1.676 m)   Wt 245 lb (111.1 kg)   LMP 06/30/2016   SpO2 100%   BMI 39.54 kg/m   Physical Exam  Constitutional: She is oriented to person, place, and time. She appears well-developed and well-nourished. No distress.  Obese.   HENT:  Head: Normocephalic and atraumatic.  Right Ear: External ear normal.  Left Ear: External ear normal.  Nose: Nose normal.  Eyes: Right eye exhibits no discharge. Left eye exhibits no discharge.  Cardiovascular: Normal rate,  regular rhythm and normal heart sounds.   Pulses:      Dorsalis pedis pulses are 2+ on the right side, and 2+ on the left side.  Pulmonary/Chest: Effort normal and breath sounds normal.  Abdominal: Soft. There is no tenderness.  Musculoskeletal:  Symmetric right foot and ankle swelling. Mild lateral ankle tenderness and dorsal foot tenderness. Normal ROM of foot and ankle. Mild calf swelling. No skin changes.   Neurological: She is alert and oriented to person, place, and time.  Skin: Skin is warm and dry. She is not diaphoretic.  Nursing note and vitals reviewed.    ED Treatments / Results  DIAGNOSTIC STUDIES: Oxygen Saturation is 100% on RA, normal by my interpretation.    COORDINATION OF CARE: 4:46 PM- Pt advised of plan for treatment and pt agrees.  Labs (all labs ordered are listed, but only abnormal results are displayed) Labs Reviewed  COMPREHENSIVE METABOLIC PANEL - Abnormal; Notable for the following:       Result Value   Sodium 134 (*)    Glucose, Bld 149 (*)    Calcium 8.6 (*)    Alkaline Phosphatase 30 (*)    All other components within normal limits  CBC WITH DIFFERENTIAL/PLATELET - Abnormal; Notable for the following:    HCT 35.6 (*)    All other components within normal limits  TROPONIN I  PREGNANCY, URINE    EKG  EKG Interpretation  Date/Time:  Monday July 30 2016 16:27:49 EDT Ventricular Rate:  66 PR Interval:    QRS Duration: 94 QT Interval:  401 QTC Calculation: 421 R Axis:   55 Text Interpretation:  Sinus rhythm Borderline T abnormalities, inferior leads Baseline wander in lead(s) I III aVL V1 no significant change since Jan 2018 Confirmed by Sherwood Gambler [id number]) on 07/30/2016 4:40:39 PM       Radiology Dg Chest 2 View  Result Date: 07/30/2016 CLINICAL DATA:  Shortness of breath. Swelling of the right foot and ankle. EXAM: CHEST  2 VIEW COMPARISON:  03/02/2013 FINDINGS: The heart size and mediastinal contours are within normal limits. Both  lungs are clear. The visualized skeletal structures are unremarkable. IMPRESSION: No active cardiopulmonary disease. Electronically Signed   By: Lorriane Shire M.D.   On: 07/30/2016 18:20   Dg Ankle Complete Right  Result Date: 07/30/2016 CLINICAL DATA:  Right foot and ankle swelling. EXAM: RIGHT ANKLE - COMPLETE 3+ VIEW COMPARISON:  None. FINDINGS: There is no evidence of fracture, dislocation, or joint effusion. There is no evidence of arthropathy or other focal bone abnormality. There is diffuse soft tissue swelling about the ankle. IMPRESSION: No acute fracture or dislocation identified about the right Ankle. Diffuse soft tissue swelling. Electronically Signed   By: Fidela Salisbury M.D.   On: 07/30/2016 18:28   US Venous Img Lower Unilateral Right  Result Date: 07/30/2016 CLINICAL DATA:  Right foot swelling x3 days with slight erythema. EXAM: RIGHT LOWER EXTREMITY VENOUS DOPPLER ULTRASOUND TECHNIQUE:  Gray-scale sonography with graded compression, as well as color Doppler and duplex ultrasound were performed to evaluate the lower extremity deep venous systems from the level of the common femoral vein and including the common femoral, femoral, profunda femoral, popliteal and calf veins including the posterior tibial, peroneal and gastrocnemius veins when visible. The superficial great saphenous vein was also interrogated. Spectral Doppler was utilized to evaluate flow at rest and with distal augmentation maneuvers in the common femoral, femoral and popliteal veins. COMPARISON:  None. FINDINGS: Contralateral Common Femoral Vein: Respiratory phasicity is normal and symmetric with the symptomatic side. No evidence of thrombus. Normal compressibility. Common Femoral Vein: No evidence of thrombus. Normal compressibility, respiratory phasicity and response to augmentation. Saphenofemoral Junction: No evidence of thrombus. Normal compressibility and flow on color Doppler imaging. Profunda Femoral Vein: No  evidence of thrombus. Normal compressibility and flow on color Doppler imaging. Femoral Vein: No evidence of thrombus. Normal compressibility, respiratory phasicity and response to augmentation. Popliteal Vein: No evidence of thrombus. Normal compressibility, respiratory phasicity and response to augmentation. Calf Veins: No evidence of thrombus.  Normal compressibility. Superficial Great Saphenous Vein: No evidence of thrombus. Normal compressibility and flow on color Doppler imaging. Venous Reflux:  None. Other Findings: Subcutaneous interstitial edema is seen at the level of the ankle. No drainable fluid collections are noted. IMPRESSION: No evidence of DVT within the right lower extremity. Subcutaneous interstitial edema at the level of the ankle. Electronically Signed   By: Kellee Royalty M.D.   On: 07/30/2016 18:16   Dg Foot Complete Right  Result Date: 07/30/2016 CLINICAL DATA:  Right foot and ankle swelling. EXAM: RIGHT FOOT COMPLETE - 3+ VIEW COMPARISON:  None. FINDINGS: There is no evidence of fracture or dislocation. There is no arthropathy. Small enthesophytes at the Achilles insertion and at the plantar fascia origin on the calcaneus. Soft tissue swelling over the dorsum of the foot and circumferentially at the ankle. IMPRESSION: No acute bone abnormality.  Soft tissue swelling. Electronically Signed   By: Lorriane Shire M.D.   On: 07/30/2016 18:19    Procedures Procedures (including critical care time)  Medications Ordered in ED Medications  acetaminophen (TYLENOL) tablet 1,000 mg (1,000 mg Oral Given 07/30/16 1711)     Initial Impression / Assessment and Plan / ED Course  I have reviewed the triage vital signs and the nursing notes.  Pertinent labs & imaging results that were available during my care of the patient were reviewed by me and considered in my medical decision making (see chart for details).  Clinical Course as of Jul 31 1906  Mon Jul 30, 2016  1659 Will rule out DVT. My  suspicion is her chest pain is quite atypical and probably more stress related then ACS, PE, or dissection. ECG is not concerning and unchanged from prior. We'll get labs, chest x-ray, DVT ultrasound, and x-ray her foot and ankle.  [SG]    Clinical Course User Index [SG] Sherwood Gambler, MD    No clear cause for her right lower extremity swelling. At this point there is no obvious fracture or DVT. Will treat conservatively with compression stocking, NSAIDs, ice, and elevation. Discussed return precautions. Her chest pain is very atypical and probably is stress related, I think ACS, PE, or dissection are unlikely. Discussed need for a PCP for further maintenance and follow-up. Discussed return precautions.  Final Clinical Impressions(s) / ED Diagnoses   Final diagnoses:  Right leg swelling    New Prescriptions New Prescriptions  No medications on file   I personally performed the services described in this documentation, which was scribed in my presence. The recorded information has been reviewed and is accurate.    Sherwood Gambler, MD 07/30/16 [phone]

## 2016-07-30 NOTE — ED Triage Notes (Signed)
Patient states that she has had increased swelling to her bilateral feet for the last 2 -3 days. Reports that it is worse at night.

## 2016-08-12 ENCOUNTER — Emergency Department (HOSPITAL_BASED_OUTPATIENT_CLINIC_OR_DEPARTMENT_OTHER)
Admission: EM | Admit: 2016-08-12 | Discharge: 2016-08-12 | Disposition: A | Discharge status: Home / Self Care | Payer: Self-pay | Attending: Emergency Medicine | Admitting: Emergency Medicine

## 2016-08-12 ENCOUNTER — Encounter (HOSPITAL_BASED_OUTPATIENT_CLINIC_OR_DEPARTMENT_OTHER): Payer: Self-pay | Admitting: Emergency Medicine

## 2016-08-12 DIAGNOSIS — Z79899 Other long term (current) drug therapy: Secondary | ICD-10-CM | POA: Insufficient documentation

## 2016-08-12 DIAGNOSIS — Z793 Long term (current) use of hormonal contraceptives: Secondary | ICD-10-CM | POA: Insufficient documentation

## 2016-08-12 DIAGNOSIS — J069 Acute upper respiratory infection, unspecified: Secondary | ICD-10-CM | POA: Insufficient documentation

## 2016-08-12 DIAGNOSIS — Z87891 Personal history of nicotine dependence: Secondary | ICD-10-CM | POA: Insufficient documentation

## 2016-08-12 MED ORDER — BENZONATATE 100 MG PO CAPS: 100.0000 mg | ORAL_CAPSULE | Freq: Three times a day (TID) | ORAL | 0 refills | Status: DC

## 2016-08-12 NOTE — Discharge Instructions (Signed)
Your symptoms are consistent with a viral illness. Viruses do not require antibiotics. Treatment is symptomatic care and it is important to note that these symptoms may last for 7-14 days.   Hand washing: Wash your hands throughout the day, but especially before and after touching the face, using the restroom, sneezing, coughing, or touching surfaces that have been coughed or sneezed upon. Hydration: Symptoms will be intensified and complicated by dehydration. Dehydration can also extend the duration of symptoms. Drink plenty of fluids and get plenty of rest. You should be drinking at least half a liter of water an hour to stay hydrated. Electrolyte drinks are also encouraged. You should be drinking enough fluids to make your urine light yellow, almost clear. If this is not the case, you are not drinking enough water. Please note that some of the treatments indicated below will not be effective if you are not adequately hydrated. Pain or fever: Ibuprofen, Naproxen, or Tylenol for pain or fever.  Cough: Use the Tessalon for cough.  Congestion: Plain Mucinex may help relieve congestion. Saline sinus rinses and saline nasal sprays may also help relieve congestion.  Sore throat: Warm liquids or Chloraseptic spray may help soothe a sore throat. Gargle twice a day with a salt water solution made from a half teaspoon of salt in a cup of warm water.  Follow up: Follow up with a primary care provider, as needed, for any future management of this issue.

## 2016-08-12 NOTE — ED Triage Notes (Signed)
Pt reports cough and congestion x 2-3 days; sts " I know what's wrong with me. I have bronchitis."

## 2016-08-12 NOTE — ED Provider Notes (Signed)
[facility] Provider Note   CSN: [id number] Arrival date & time: 08/12/16  7035     History   Chief Complaint Chief Complaint  Patient presents with  . Cough    HPI Colleen Downs is a 36 y.o. female.  HPI   Colleen Downs is a 36 y.o. female, patient with no pertinent past medical history, presenting to the ED with Nasal congestion, productive cough, bilateral ear pressure, and sneezing for the last 3 days. Pain is minor, aching, nonradiating. 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 She has not tried any treatments for her symptoms. Denies shortness of breath, chest pain, fever/chills, N/V/D, ear drainage, or any other complaints.      Past Medical History:  Diagnosis Date  . Irregular periods   . Kidney stone     There are no active problems to display for this patient.   Past Surgical History:  Procedure Laterality Date  . CHOLECYSTECTOMY      OB History    No data available       Home Medications    Prior to Admission medications   Medication Sig Start Date End Date Taking? Authorizing Provider  amoxicillin (AMOXIL) 500 MG capsule Take 1 capsule (500 mg total) by mouth 3 (three) times daily. 08/21/12   Blanchie Dessert, MD  benzonatate (TESSALON) 100 MG capsule Take 1 capsule (100 mg total) by mouth every 8 (eight) hours. 08/12/16   Nahiem Dredge C, PA-C  cephALEXin (KEFLEX) 500 MG capsule Take 1 capsule (500 mg total) by mouth 4 (four) times daily. 09/22/13   Fransico Meadow, PA-C  cetirizine-pseudoephedrine (ZYRTEC-D) 5-120 MG per tablet Take 1 tablet by mouth daily. 08/21/12   Blanchie Dessert, MD  ibuprofen (ADVIL,MOTRIN) 800 MG tablet Take 1 tablet (800 mg total) by mouth 3 (three) times daily. 09/10/14   Evelina Bucy, MD  neomycin-polymyxin-hydrocortisone (CORTISPORIN) 3.5-10000-1 otic suspension Place 4 drops into the left ear 4 (four) times daily. 07/01/13   Fransico Meadow, PA-C    Norethin-Eth Estrad Triphasic (TRI-NORINYL, 28, PO) Take 1 tablet by mouth daily.    [provider]  promethazine (PHENERGAN) 25 MG tablet Take 1 tablet (25 mg total) by mouth every 6 (six) hours as needed for nausea. 08/01/11 08/08/11  Hunt, Romelle Starcher, PA-C  Tamsulosin HCl (FLOMAX) 0.4 MG CAPS Take 1 capsule (0.4 mg total) by mouth daily. 0/09/38   Delora Fuel, MD    Family History No family history on file.  Social History Social History  Substance Use Topics  . Smoking status: Former Research scientist (life sciences)  . Smokeless tobacco: Never Used  . Alcohol use Yes     Comment: occ     Allergies   Sulfa drugs cross reactors   Review of Systems Review of Systems  Constitutional: Negative for chills, diaphoresis and fever.  HENT: Positive for congestion, ear pain and sneezing. Negative for ear discharge, sore throat, trouble swallowing and voice change.   Respiratory: Positive for cough. Negative for shortness of breath.   Cardiovascular: Negative for chest pain.  Gastrointestinal: Negative for abdominal pain, diarrhea, nausea and vomiting.  All other systems reviewed and are negative.    Physical Exam Updated Vital Signs BP (!) 142/86 (BP Location: Left Arm)   Pulse 68   Temp 98.7 F (37.1 C) (Oral)   Resp 16   Ht 5\' 6"  (1.676 m)   Wt 111.1 kg (245 lb)   SpO2  100%   BMI 39.54 kg/m   Physical Exam  Constitutional: She appears well-developed and well-nourished. No distress.  HENT:  Head: Normocephalic and atraumatic.  Right Ear: Tympanic membrane, external ear and ear canal normal.  Left Ear: Tympanic membrane, external ear and ear canal normal.  Nose: Mucosal edema and rhinorrhea present.  Eyes: Conjunctivae are normal.  Neck: Neck supple.  Cardiovascular: Normal rate, regular rhythm, normal heart sounds and intact distal pulses.   Pulmonary/Chest: Effort normal and breath sounds normal. No respiratory distress.  Abdominal: Soft. There is no tenderness. There is no guarding.   Musculoskeletal: She exhibits no edema.  Lymphadenopathy:    She has no cervical adenopathy.  Neurological: She is alert.  Skin: Skin is warm and dry. She is not diaphoretic.  Psychiatric: She has a normal mood and affect. Her behavior is normal.  Nursing note and vitals reviewed.    ED Treatments / Results  Labs (all labs ordered are listed, but only abnormal results are displayed) Labs Reviewed - No data to display  EKG  EKG Interpretation None       Radiology No results found.  Procedures Procedures (including critical care time)  Medications Ordered in ED Medications - No data to display   Initial Impression / Assessment and Plan / ED Course  I have reviewed the triage vital signs and the nursing notes.  Pertinent labs & imaging results that were available during my care of the patient were reviewed by me and considered in my medical decision making (see chart for details).      Patient presents with URI-type symptoms. Viral etiology most likely. Patient is well-appearing and in no apparent distress. PCP follow up. The patient was given instructions for home care as well as return precautions. Patient voices understanding of these instructions, accepts the plan, and is comfortable with discharge.   Final Clinical Impressions(s) / ED Diagnoses   Final diagnoses:  Upper respiratory tract infection, unspecified type    New Prescriptions Discharge Medication List as of 08/12/2016 10:14 AM    START taking these medications   Details  benzonatate (TESSALON) 100 MG capsule Take 1 capsule (100 mg total) by mouth every 8 (eight) hours., Starting Sun 08/12/2016, Print         Beverely Suen C, PA-C 08/12/16 1726    Quintella Reichert, MD 08/13/16 0900

## 2016-08-24 ENCOUNTER — Emergency Department (HOSPITAL_BASED_OUTPATIENT_CLINIC_OR_DEPARTMENT_OTHER)
Admission: EM | Admit: 2016-08-24 | Discharge: 2016-08-24 | Disposition: A | Discharge status: Home / Self Care | Payer: Self-pay | Attending: Emergency Medicine | Admitting: Emergency Medicine

## 2016-08-24 ENCOUNTER — Emergency Department (HOSPITAL_BASED_OUTPATIENT_CLINIC_OR_DEPARTMENT_OTHER): Payer: Self-pay

## 2016-08-24 ENCOUNTER — Encounter (HOSPITAL_BASED_OUTPATIENT_CLINIC_OR_DEPARTMENT_OTHER): Payer: Self-pay | Admitting: *Deleted

## 2016-08-24 DIAGNOSIS — F17200 Nicotine dependence, unspecified, uncomplicated: Secondary | ICD-10-CM | POA: Insufficient documentation

## 2016-08-24 DIAGNOSIS — Z79899 Other long term (current) drug therapy: Secondary | ICD-10-CM | POA: Insufficient documentation

## 2016-08-24 DIAGNOSIS — M25571 Pain in right ankle and joints of right foot: Secondary | ICD-10-CM | POA: Insufficient documentation

## 2016-08-24 LAB — URINALYSIS, ROUTINE W REFLEX MICROSCOPIC
BILIRUBIN URINE: NEGATIVE
Glucose, UA: NEGATIVE mg/dL
Hgb urine dipstick: NEGATIVE
KETONES UR: NEGATIVE mg/dL
LEUKOCYTES UA: NEGATIVE
NITRITE: NEGATIVE
PH: 7 (ref 5.0–8.0)
PROTEIN: NEGATIVE mg/dL
Specific Gravity, Urine: 1.017 (ref 1.005–1.030)

## 2016-08-24 LAB — COMPREHENSIVE METABOLIC PANEL
ALT: 20 U/L (ref 14–54)
ANION GAP: 8 (ref 5–15)
AST: 22 U/L (ref 15–41)
Albumin: 3.6 g/dL (ref 3.5–5.0)
Alkaline Phosphatase: 30 U/L — ABNORMAL LOW (ref 38–126)
BILIRUBIN TOTAL: 0.5 mg/dL (ref 0.3–1.2)
BUN: 7 mg/dL (ref 6–20)
CO2: 24 mmol/L (ref 22–32)
CREATININE: 0.75 mg/dL (ref 0.44–1.00)
Calcium: 9 mg/dL (ref 8.9–10.3)
Chloride: 104 mmol/L (ref 101–111)
Glucose, Bld: 115 mg/dL — ABNORMAL HIGH (ref 65–99)
Potassium: 3.7 mmol/L (ref 3.5–5.1)
SODIUM: 136 mmol/L (ref 135–145)
TOTAL PROTEIN: 7.2 g/dL (ref 6.5–8.1)

## 2016-08-24 LAB — CBC
HEMATOCRIT: 35.5 % — AB (ref 36.0–46.0)
HEMOGLOBIN: 12.2 g/dL (ref 12.0–15.0)
MCH: 31.4 pg (ref 26.0–34.0)
MCHC: 34.4 g/dL (ref 30.0–36.0)
MCV: 91.3 fL (ref 78.0–100.0)
Platelets: 334 10*3/uL (ref 150–400)
RBC: 3.89 MIL/uL (ref 3.87–5.11)
RDW: 13.3 % (ref 11.5–15.5)
WBC: 6.9 10*3/uL (ref 4.0–10.5)

## 2016-08-24 LAB — PREGNANCY, URINE: Preg Test, Ur: NEGATIVE

## 2016-08-24 MED ORDER — ETODOLAC 300 MG PO CAPS: 300.0000 mg | ORAL_CAPSULE | Freq: Three times a day (TID) | ORAL | 0 refills | Status: DC

## 2016-08-24 MED FILL — ETODOLAC 300 MG CAPSULE: 300 | 7 days supply | Qty: 21 | Fill #0

## 2016-08-24 NOTE — ED Triage Notes (Signed)
Swelling in her right foot x 2 weeks. Painful. Started after a virus. She has been evaluated for a DVT and the test were negative.

## 2016-08-24 NOTE — ED Provider Notes (Signed)
[facility] Provider Note   CSN: [id number] Arrival date & time: 08/24/16  1100     History   Chief Complaint Chief Complaint  Patient presents with  . Leg Swelling    HPI Colleen Downs is a 36 y.o. female.  HPI Patient presents to the emergency room for evaluation of persistent leg swelling and ankle pain. Patient was initially seen back in June 4 with complaints of right leg swelling. The patient had an evaluation on June 4 that included laboratory testing and a Doppler ultrasound. She also had x-rays of her foot and ankle. Ultrasound was normal. Laboratory tests were normal. X-rays were normal. The patient states she continues to have intermittent swelling in her right leg. She is having some pain now in her ankle. She has a sensation of swelling that goes up in the entire right side of her body only even up into her neck. She denies any trouble with chest pain or shortness of breath. Patient is worried that there is something wrong. She says she knows her body. She is worried she might be having liver or kidney problems. Past Medical History:  Diagnosis Date  . Irregular periods   . Kidney stone     There are no active problems to display for this patient.   Past Surgical History:  Procedure Laterality Date  . CHOLECYSTECTOMY      OB History    No data available       Home Medications    Prior to Admission medications   Medication Sig Start Date End Date Taking? Authorizing Provider  amoxicillin (AMOXIL) 500 MG capsule Take 1 capsule (500 mg total) by mouth 3 (three) times daily. 08/21/12   Blanchie Dessert, MD  benzonatate (TESSALON) 100 MG capsule Take 1 capsule (100 mg total) by mouth every 8 (eight) hours. 08/12/16   Joy, Shawn C, PA-C  cephALEXin (KEFLEX) 500 MG capsule Take 1 capsule (500 mg total) by mouth 4 (four) times daily. 09/22/13   Fransico Meadow, PA-C  cetirizine-pseudoephedrine (ZYRTEC-D) 5-120 MG per tablet Take 1 tablet by mouth daily.  08/21/12   Blanchie Dessert, MD  etodolac (LODINE) 300 MG capsule Take 1 capsule (300 mg total) by mouth every 8 (eight) hours. 08/24/16   Dorie Rank, MD  ibuprofen (ADVIL,MOTRIN) 800 MG tablet Take 1 tablet (800 mg total) by mouth 3 (three) times daily. 09/10/14   Evelina Bucy, MD  neomycin-polymyxin-hydrocortisone (CORTISPORIN) 3.5-10000-1 otic suspension Place 4 drops into the left ear 4 (four) times daily. 07/01/13   Fransico Meadow, PA-C  Norethin-Eth Estrad Triphasic (TRI-NORINYL, 28, PO) Take 1 tablet by mouth daily.    [provider]  promethazine (PHENERGAN) 25 MG tablet Take 1 tablet (25 mg total) by mouth every 6 (six) hours as needed for nausea. 08/01/11 08/08/11  Hunt, Romelle Starcher, PA-C  Tamsulosin HCl (FLOMAX) 0.4 MG CAPS Take 1 capsule (0.4 mg total) by mouth daily. 7/37/10   Delora Fuel, MD    Family History No family history on file.  Social History Social History  Substance Use Topics  . Smoking status: Former Research scientist (life sciences)  . Smokeless tobacco: Never Used  . Alcohol use Yes     Comment: occ     Allergies   Sulfa drugs cross reactors   Review of Systems Review of Systems  All other systems reviewed and are negative.    Physical Exam Updated Vital Signs BP 108/73   Pulse 65   Temp 98.3 F (36.8 C) (Oral)  Resp 20   Ht 1.676 m (5\' 6" )   Wt 111.1 kg (245 lb)   SpO2 98%   BMI 39.54 kg/m   Physical Exam  Constitutional: She appears well-developed and well-nourished. No distress.  Overweight  HENT:  Head: Normocephalic and atraumatic.  Right Ear: External ear normal.  Left Ear: External ear normal.  Mouth/Throat: No oropharyngeal exudate.  Eyes: Conjunctivae are normal. Right eye exhibits no discharge. Left eye exhibits no discharge. No scleral icterus.  Neck: Neck supple. No tracheal deviation present.  No swelling noted in the neck or submandibular region  Cardiovascular: Normal rate, regular rhythm and intact distal pulses.   Pulmonary/Chest: Effort  normal and breath sounds normal. No stridor. No respiratory distress. She has no wheezes. She has no rales.  Abdominal: Soft. Bowel sounds are normal. She exhibits no distension. There is no tenderness. There is no rebound and no guarding.  Protuberant, no masses  Musculoskeletal: She exhibits tenderness. She exhibits no edema.       Right ankle: She exhibits no swelling. Tenderness.  Patient might have some mild edema in the right ankle and foot. Is not appreciably different from the left side, there is no erythema  Neurological: She is alert. She has normal strength. No cranial nerve deficit (no facial droop, extraocular movements intact, no slurred speech) or sensory deficit. She exhibits normal muscle tone. She displays no seizure activity. Coordination normal.  Skin: Skin is warm and dry. Rash (faint malar rash) noted.  Psychiatric: She has a normal mood and affect.  Nursing note and vitals reviewed.    ED Treatments / Results  Labs (all labs ordered are listed, but only abnormal results are displayed) Labs Reviewed  CBC - Abnormal; Notable for the following:       Result Value   HCT 35.5 (*)    All other components within normal limits  COMPREHENSIVE METABOLIC PANEL - Abnormal; Notable for the following:    Glucose, Bld 115 (*)    Alkaline Phosphatase 30 (*)    All other components within normal limits  URINALYSIS, ROUTINE W REFLEX MICROSCOPIC - Abnormal; Notable for the following:    APPearance CLOUDY (*)    All other components within normal limits  PREGNANCY, URINE      Procedures Procedures (including critical care time)  Medications Ordered in ED Medications - No data to display   Initial Impression / Assessment and Plan / ED Course  I have reviewed the triage vital signs and the nursing notes.  Pertinent labs & imaging results that were available during my care of the patient were reviewed by me and considered in my medical decision making (see chart for  details).   patient presented to the emergency room with persistent right leg pain. The patient had a thorough workup when she was initially seen. No evidence to suggest heart disease. No evidence to DVT. Her labs do not suggest renal failure, cirrhosis or other medical condition associated with edema.  On my exam the patient has more focal swelling and tenderness in the ankle and foot area. I'm more suspicious for an arthropathy such as lupus. I reassured the patient that I do not see any signs of any acute emergency condition today. She does have plans to follow up with an outpatient medical clinic. Plan on discharge home with NSAIDs.  Final Clinical Impressions(s) / ED Diagnoses   Final diagnoses:  Arthralgia of right ankle    New Prescriptions New Prescriptions   ETODOLAC (LODINE) 300  MG CAPSULE    Take 1 capsule (300 mg total) by mouth every 8 (eight) hours.     Dorie Rank, MD 08/24/16 [phone]

## 2016-08-24 NOTE — Discharge Instructions (Signed)
Follow-up with the clinic as you have planned, take the medications as needed for pain and inflammation

## 2016-08-24 NOTE — ED Notes (Signed)
Pt directed to pharmacy to pick up RX 

## 2016-08-24 NOTE — ED Notes (Signed)
ED Provider at bedside. 

## 2016-09-03 ENCOUNTER — Inpatient Hospital Stay: Payer: Self-pay

## 2016-12-03 ENCOUNTER — Encounter (HOSPITAL_BASED_OUTPATIENT_CLINIC_OR_DEPARTMENT_OTHER): Payer: Self-pay | Admitting: *Deleted

## 2016-12-03 ENCOUNTER — Emergency Department (HOSPITAL_BASED_OUTPATIENT_CLINIC_OR_DEPARTMENT_OTHER)
Admission: EM | Admit: 2016-12-03 | Discharge: 2016-12-03 | Disposition: A | Discharge status: Home / Self Care | Payer: Self-pay | Attending: Emergency Medicine | Admitting: Emergency Medicine

## 2016-12-03 DIAGNOSIS — Z79899 Other long term (current) drug therapy: Secondary | ICD-10-CM | POA: Insufficient documentation

## 2016-12-03 DIAGNOSIS — Z87891 Personal history of nicotine dependence: Secondary | ICD-10-CM | POA: Insufficient documentation

## 2016-12-03 DIAGNOSIS — R197 Diarrhea, unspecified: Secondary | ICD-10-CM | POA: Insufficient documentation

## 2016-12-03 DIAGNOSIS — R109 Unspecified abdominal pain: Secondary | ICD-10-CM | POA: Insufficient documentation

## 2016-12-03 DIAGNOSIS — R112 Nausea with vomiting, unspecified: Secondary | ICD-10-CM | POA: Insufficient documentation

## 2016-12-03 HISTORY — DX: Diarrhea, unspecified: R19.7

## 2016-12-03 LAB — URINALYSIS, ROUTINE W REFLEX MICROSCOPIC
Bilirubin Urine: NEGATIVE
GLUCOSE, UA: NEGATIVE mg/dL
HGB URINE DIPSTICK: NEGATIVE
KETONES UR: NEGATIVE mg/dL
Leukocytes, UA: NEGATIVE
Nitrite: NEGATIVE
PROTEIN: NEGATIVE mg/dL
Specific Gravity, Urine: 1.025 (ref 1.005–1.030)
pH: 6 (ref 5.0–8.0)

## 2016-12-03 LAB — LIPASE, BLOOD: LIPASE: 28 U/L (ref 11–51)

## 2016-12-03 LAB — COMPREHENSIVE METABOLIC PANEL
ALT: 26 U/L (ref 14–54)
ANION GAP: 6 (ref 5–15)
AST: 26 U/L (ref 15–41)
Albumin: 3.6 g/dL (ref 3.5–5.0)
Alkaline Phosphatase: 35 U/L — ABNORMAL LOW (ref 38–126)
BUN: 8 mg/dL (ref 6–20)
CALCIUM: 8.9 mg/dL (ref 8.9–10.3)
CO2: 24 mmol/L (ref 22–32)
Chloride: 105 mmol/L (ref 101–111)
Creatinine, Ser: 0.65 mg/dL (ref 0.44–1.00)
GFR calc Af Amer: >60 mL/min (ref >60)
GFR calc non Af Amer: >60 mL/min (ref >60)
Glucose, Bld: 131 mg/dL — ABNORMAL HIGH (ref 65–99)
POTASSIUM: 3.6 mmol/L (ref 3.5–5.1)
SODIUM: 135 mmol/L (ref 135–145)
TOTAL PROTEIN: 7.6 g/dL (ref 6.5–8.1)
Total Bilirubin: 0.3 mg/dL (ref 0.3–1.2)

## 2016-12-03 LAB — CBC
HEMATOCRIT: 35.6 % — AB (ref 36.0–46.0)
Hemoglobin: 12.1 g/dL (ref 12.0–15.0)
MCH: 31.1 pg (ref 26.0–34.0)
MCHC: 34 g/dL (ref 30.0–36.0)
MCV: 91.5 fL (ref 78.0–100.0)
PLATELETS: 316 10*3/uL (ref 150–400)
RBC: 3.89 MIL/uL (ref 3.87–5.11)
RDW: 13.3 % (ref 11.5–15.5)
WBC: 7.7 10*3/uL (ref 4.0–10.5)

## 2016-12-03 LAB — PREGNANCY, URINE: Preg Test, Ur: NEGATIVE

## 2016-12-03 MED ORDER — DICYCLOMINE HCL 10 MG PO CAPS
20.0000 mg | ORAL_CAPSULE | Freq: Once | ORAL | Status: AC
  Administered 2016-12-03: 20 mg via ORAL
  Filled 2016-12-03: qty 2

## 2016-12-03 MED ORDER — SODIUM CHLORIDE 0.9 % IV BOLUS (SEPSIS)
1000.0000 mL | Freq: Once | INTRAVENOUS | Status: AC
  Administered 2016-12-03: 1000 mL via INTRAVENOUS

## 2016-12-03 MED ORDER — ONDANSETRON HCL 4 MG/2ML IJ SOLN
4.0000 mg | Freq: Once | INTRAMUSCULAR | Status: AC
  Administered 2016-12-03: 4 mg via INTRAVENOUS
  Filled 2016-12-03: qty 2

## 2016-12-03 MED ORDER — DICYCLOMINE HCL 20 MG PO TABS: 20.0000 mg | ORAL_TABLET | Freq: Two times a day (BID) | ORAL | 0 refills | Status: DC

## 2016-12-03 MED ORDER — ONDANSETRON 4 MG PO TBDP: 4.0000 mg | ORAL_TABLET | Freq: Three times a day (TID) | ORAL | 0 refills | Status: DC | PRN

## 2016-12-03 NOTE — ED Notes (Signed)
ED Provider at bedside. 

## 2016-12-03 NOTE — ED Notes (Signed)
Pt reports she has had "stomach issues" for a long time. Pt reports she had her gallbladder taken out 4 years ago. Pt reports she had issues before and after her surgery. Pt reports her symptoms have been going on this time for several months.

## 2016-12-03 NOTE — ED Provider Notes (Signed)
[facility] Provider Note   CSN: [id number] Arrival date & time: 12/03/16  1519     History   Chief Complaint Chief Complaint  Patient presents with  . Nausea  . Diarrhea    HPI Colleen Downs is a 36 y.o. female.  HPI   Colleen Downs is a 36 y.o. female, with a history of recurrent diarrhea, presenting to the ED with abdominal discomfort, nausea, vomiting, and diarrhea for at least the last year. This has recurred over the last few days. Abdominal discomfort is generalized cramping, moderate to severe, nonradiating. States that it occurs shortly after eating and she has sudden urgency to move her bowels. This is intermittent with constipation. Her last bowel movement was today and was soft stool. She does note some streaks of blood on the paper that she associates with perianal irritation. She has not been evaluated recently for this issue. She has not taken any medications or tried any therapies. She denies urinary complaints, fever/chills, gross hematochezia, melena, abnormal vaginal bleeding or discharge, or any other complaints.       Past Medical History:  Diagnosis Date  . Diarrhea   . Irregular periods     There are no active problems to display for this patient.   Past Surgical History:  Procedure Laterality Date  . CHOLECYSTECTOMY      OB History    No data available       Home Medications    Prior to Admission medications   Medication Sig Start Date End Date Taking? Authorizing Provider  amoxicillin (AMOXIL) 500 MG capsule Take 1 capsule (500 mg total) by mouth 3 (three) times daily. 08/21/12   Blanchie Dessert, MD  benzonatate (TESSALON) 100 MG capsule Take 1 capsule (100 mg total) by mouth every 8 (eight) hours. 08/12/16   Domingos Riggi C, PA-C  cephALEXin (KEFLEX) 500 MG capsule Take 1 capsule (500 mg total) by mouth 4 (four) times daily. 09/22/13   Fransico Meadow, PA-C  cetirizine-pseudoephedrine (ZYRTEC-D) 5-120 MG per tablet Take 1 tablet  by mouth daily. 08/21/12   Blanchie Dessert, MD  dicyclomine (BENTYL) 20 MG tablet Take 1 tablet (20 mg total) by mouth 2 (two) times daily. 12/03/16   Ilia Dimaano C, PA-C  etodolac (LODINE) 300 MG capsule Take 1 capsule (300 mg total) by mouth every 8 (eight) hours. 08/24/16   Dorie Rank, MD  ibuprofen (ADVIL,MOTRIN) 800 MG tablet Take 1 tablet (800 mg total) by mouth 3 (three) times daily. 09/10/14   Evelina Bucy, MD  neomycin-polymyxin-hydrocortisone (CORTISPORIN) 3.5-10000-1 otic suspension Place 4 drops into the left ear 4 (four) times daily. 07/01/13   Fransico Meadow, PA-C  Norethin-Eth Estrad Triphasic (TRI-NORINYL, 28, PO) Take 1 tablet by mouth daily.    [provider]  ondansetron (ZOFRAN ODT) 4 MG disintegrating tablet Take 1 tablet (4 mg total) by mouth every 8 (eight) hours as needed for nausea or vomiting. 12/03/16   Jermany Rimel C, PA-C  promethazine (PHENERGAN) 25 MG tablet Take 1 tablet (25 mg total) by mouth every 6 (six) hours as needed for nausea. 08/01/11 08/08/11  Hunt, Romelle Starcher, PA-C  Tamsulosin HCl (FLOMAX) 0.4 MG CAPS Take 1 capsule (0.4 mg total) by mouth daily. 0/48/88   Delora Fuel, MD    Family History No family history on file.  Social History Social History  Substance Use Topics  . Smoking status: Former Research scientist (life sciences)  . Smokeless tobacco: Never Used  . Alcohol use Yes  Comment: occ     Allergies   Sulfa drugs cross reactors   Review of Systems Review of Systems  Constitutional: Negative for chills, diaphoresis and fever.  Respiratory: Negative for shortness of breath.   Cardiovascular: Negative for chest pain.  Gastrointestinal: Positive for abdominal pain, constipation, diarrhea, nausea and vomiting.  Genitourinary: Negative for difficulty urinating, dysuria, flank pain, hematuria, vaginal bleeding and vaginal discharge.  Musculoskeletal: Negative for back pain.  All other systems reviewed and are negative.    Physical Exam Updated Vital Signs BP  116/89 (BP Location: Left Arm)   Pulse 73   Temp 98.6 F (37 C) (Oral)   Resp 18   Ht 5\' 6"  (1.676 m)   Wt 111.1 kg (245 lb)   SpO2 100%   BMI 39.54 kg/m   Physical Exam  Constitutional: She appears well-developed and well-nourished. No distress.  HENT:  Head: Normocephalic and atraumatic.  Eyes: Conjunctivae are normal.  Neck: Neck supple.  Cardiovascular: Normal rate, regular rhythm, normal heart sounds and intact distal pulses.   Pulmonary/Chest: Effort normal and breath sounds normal. No respiratory distress.  Abdominal: Soft. Bowel sounds are normal. There is generalized tenderness. There is no guarding.  Abdominal pain is equally generalized and indicated verbally by the patient.  Musculoskeletal: She exhibits no edema.  Lymphadenopathy:    She has no cervical adenopathy.  Neurological: She is alert.  Skin: Skin is warm and dry. She is not diaphoretic.  Psychiatric: She has a normal mood and affect. Her behavior is normal.  Nursing note and vitals reviewed.    ED Treatments / Results  Labs (all labs ordered are listed, but only abnormal results are displayed) Labs Reviewed  COMPREHENSIVE METABOLIC PANEL - Abnormal; Notable for the following:       Result Value   Glucose, Bld 131 (*)    Alkaline Phosphatase 35 (*)    All other components within normal limits  CBC - Abnormal; Notable for the following:    HCT 35.6 (*)    All other components within normal limits  LIPASE, BLOOD  URINALYSIS, ROUTINE W REFLEX MICROSCOPIC  PREGNANCY, URINE    EKG  EKG Interpretation None       Radiology No results found.  Procedures Procedures (including critical care time)  Medications Ordered in ED Medications  sodium chloride 0.9 % bolus 1,000 mL (0 mLs Intravenous Stopped 12/03/16 1829)  ondansetron (ZOFRAN) injection 4 mg (4 mg Intravenous Given 12/03/16 1648)  dicyclomine (BENTYL) capsule 20 mg (20 mg Oral Given 12/03/16 1646)     Initial Impression / Assessment  and Plan / ED Course  I have reviewed the triage vital signs and the nursing notes.  Pertinent labs & imaging results that were available during my care of the patient were reviewed by me and considered in my medical decision making (see chart for details).  Clinical Course as of Dec 03 1833  Molli Knock Dec 03, 2016  1803 Patient voices resolution in her nausea and has not had vomiting since her ED arrival. Pain has also resolved.   [SJ]    Clinical Course User Index [SJ] Fredrico Beedle C, PA-C    Patient presents with complaining of chronic abdominal discomfort, diarrhea, and occasional vomiting. Lab results encouraging. Exam relatively benign. Patient is nontoxic appearing, afebrile, not tachycardic, not tachypneic, not hypotensive, maintains excellent SPO2 on room air, and is in no apparent distress. GI follow-up. Resources given. The patient was given instructions for home care as well as return  precautions. Patient voices understanding of these instructions, accepts the plan, and is comfortable with discharge.  Vitals:   12/03/16 1525 12/03/16 1526 12/03/16 1649 12/03/16 1829  BP: 116/89  129/69 135/86  Pulse: 73  63 61  Resp: 18  20 18   Temp: 98.6 F (37 C)   98.5 F (36.9 C)  TempSrc: Oral   Oral  SpO2: 100%  97% 100%  Weight: 111.1 kg (245 lb) 111.1 kg (245 lb)    Height: 5\' 5"  (1.651 m) 5\' 6"  (1.676 m)       Final Clinical Impressions(s) / ED Diagnoses   Final diagnoses:  Nausea vomiting and diarrhea    New Prescriptions Discharge Medication List as of 12/03/2016  6:10 PM    START taking these medications   Details  dicyclomine (BENTYL) 20 MG tablet Take 1 tablet (20 mg total) by mouth 2 (two) times daily., Starting Mon 12/03/2016, Print    ondansetron (ZOFRAN ODT) 4 MG disintegrating tablet Take 1 tablet (4 mg total) by mouth every 8 (eight) hours as needed for nausea or vomiting., Starting Mon 12/03/2016, Print         Etter Royall, Blain C, PA-C 12/03/16 1836    Little,  Wenda Overland, MD 12/05/16 1345

## 2016-12-03 NOTE — Discharge Instructions (Signed)
Lab results were encouraging today. Please stay well hydrated by drinking plenty of water. Use Zofran for nausea or vomiting. Bentyl for abdominal discomfort. Follow up with the GI specialist on this matter. Return to the ED as needed.

## 2016-12-03 NOTE — ED Notes (Signed)
Pt given ginger ale per request for po challenge.

## 2016-12-24 ENCOUNTER — Encounter: Payer: Self-pay | Admitting: Internal Medicine

## 2016-12-24 ENCOUNTER — Ambulatory Visit: Payer: Self-pay | Attending: Internal Medicine | Admitting: Internal Medicine

## 2016-12-24 VITALS — BP 120/80 | HR 82 | Temp 98.4°F | Resp 16 | Ht 66.5 in | Wt 258.0 lb

## 2016-12-24 DIAGNOSIS — Z87891 Personal history of nicotine dependence: Secondary | ICD-10-CM | POA: Insufficient documentation

## 2016-12-24 DIAGNOSIS — N926 Irregular menstruation, unspecified: Secondary | ICD-10-CM | POA: Insufficient documentation

## 2016-12-24 DIAGNOSIS — R109 Unspecified abdominal pain: Secondary | ICD-10-CM | POA: Insufficient documentation

## 2016-12-24 DIAGNOSIS — Z2821 Immunization not carried out because of patient refusal: Secondary | ICD-10-CM | POA: Insufficient documentation

## 2016-12-24 DIAGNOSIS — Z6841 Body Mass Index (BMI) 40.0 and over, adult: Secondary | ICD-10-CM | POA: Insufficient documentation

## 2016-12-24 DIAGNOSIS — K582 Mixed irritable bowel syndrome: Secondary | ICD-10-CM | POA: Insufficient documentation

## 2016-12-24 DIAGNOSIS — E119 Type 2 diabetes mellitus without complications: Secondary | ICD-10-CM | POA: Insufficient documentation

## 2016-12-24 DIAGNOSIS — Z79899 Other long term (current) drug therapy: Secondary | ICD-10-CM | POA: Insufficient documentation

## 2016-12-24 DIAGNOSIS — Z23 Encounter for immunization: Secondary | ICD-10-CM | POA: Insufficient documentation

## 2016-12-24 LAB — POCT GLYCOSYLATED HEMOGLOBIN (HGB A1C): HEMOGLOBIN A1C: 7.6

## 2016-12-24 LAB — GLUCOSE, POCT (MANUAL RESULT ENTRY): POC Glucose: 148 mg/dl — AB (ref 70–99)

## 2016-12-24 MED ORDER — POLYETHYLENE GLYCOL 3350 17 GM/SCOOP PO POWD
17.0000 g | Freq: Every day | ORAL | 1 refills | Status: DC | PRN
Start: 2016-12-24 — End: 2017-12-04

## 2016-12-24 MED ORDER — TRUEPLUS LANCETS 28G MISC: 1 refills | Status: DC

## 2016-12-24 MED ORDER — LOPERAMIDE HCL 2 MG PO TABS: 2.0000 mg | ORAL_TABLET | Freq: Four times a day (QID) | ORAL | 0 refills | Status: DC | PRN

## 2016-12-24 MED ORDER — MEDROXYPROGESTERONE ACETATE 10 MG PO TABS: ORAL_TABLET | ORAL | 4 refills | Status: DC

## 2016-12-24 MED ORDER — TRUE METRIX METER W/DEVICE KIT: PACK | 0 refills | Status: DC

## 2016-12-24 MED ORDER — GLUCOSE BLOOD VI STRP: ORAL_STRIP | 12 refills | Status: DC

## 2016-12-24 MED ORDER — METFORMIN HCL 500 MG PO TABS: 500.0000 mg | ORAL_TABLET | Freq: Two times a day (BID) | ORAL | 3 refills | Status: DC

## 2016-12-24 MED ORDER — METFORMIN HCL 500 MG PO TABS: 250.0000 mg | ORAL_TABLET | Freq: Every day | ORAL | 3 refills | Status: DC

## 2016-12-24 MED FILL — TRUE METRIX GLUCOSE TEST ST: 30 days supply | Qty: 100 | Fill #0

## 2016-12-24 MED FILL — POLYETHYLENE GLYCOL 3350: 30 days supply | Qty: 510 | Fill #0

## 2016-12-24 MED FILL — MEDROXYPROGESTERONE 10 MG T: 10 | 28 days supply | Qty: 7 | Fill #0

## 2016-12-24 MED FILL — ?METFORMIN HCL 500MG TABLET: 500 | 30 days supply | Qty: 15 | Fill #0

## 2016-12-24 MED FILL — !TRUE METRIX BLOOD GLUCOSE: 1 days supply | Qty: 1 | Fill #0

## 2016-12-24 MED FILL — TRUEplus LANCETS 28G MISC: 30 days supply | Qty: 100 | Fill #0

## 2016-12-24 NOTE — Patient Instructions (Addendum)
Follow a Healthy Eating Plan - You can do it! Limit sugary drinks.  Avoid sodas, sweet tea, sport or energy drinks, or fruit drinks.  Drink water, lo-fat milk, or diet drinks. Limit snack foods.   Cut back on candy, cake, cookies, chips, ice cream.  These are a special treat, only in small amounts. Eat plenty of vegetables.  Especially dark green, red, and orange vegetables. Aim for at least 3 servings a day. More is better! Include fruit in your daily diet.  Whole fruit is much healthier than fruit juice! Limit "white" bread, "white" pasta, "white" rice.   Choose "100% whole grain" products, brown or wild rice. Avoid fatty meats. Try "Meatless Monday" and choose eggs or beans one day a week.  When eating meat, choose lean meats like chicken, turkey, and fish.  Grill, broil, or bake meats instead of frying, and eat poultry without the skin. Eat less salt.  Avoid frozen pizzas, frozen dinners and salty foods.  Use seasonings other than salt in cooking.  This can help blood pressure and keep you from swelling Beer, wine and liquor have calories.  If you can safely drink alcohol, limit to 1 drink per day for women, 2 drinks for men   Td Vaccine (Tetanus and Diphtheria): What You Need to Know 1. Why get vaccinated? Tetanus  and diphtheria are very serious diseases. They are rare in the [place] today, but people who do become infected often have severe complications. Td vaccine is used to protect adolescents and adults from both of these diseases. Both tetanus and diphtheria are infections caused by bacteria. Diphtheria spreads from person to person through coughing or sneezing. Tetanus-causing bacteria enter the body through cuts, scratches, or wounds. TETANUS (lockjaw) causes painful muscle tightening and stiffness, usually all over the body.  It can lead to tightening of muscles in the head and neck so you can't open your mouth, swallow, or sometimes even breathe. Tetanus kills about 1 out of  every 10 people who are infected even after receiving the best medical care.  DIPHTHERIA can cause a thick coating to form in the back of the throat.  It can lead to breathing problems, paralysis, heart failure, and death.  Before vaccines, as many as 200,000 cases of diphtheria and hundreds of cases of tetanus were reported in the [place] each year. Since vaccination began, reports of cases for both diseases have dropped by about 99%. 2. Td vaccine Td vaccine can protect adolescents and adults from tetanus and diphtheria. Td is usually given as a booster dose every 10 years but it can also be given earlier after a severe and dirty wound or burn. Another vaccine, called Tdap, which protects against pertussis in addition to tetanus and diphtheria, is sometimes recommended instead of Td vaccine. Your doctor or the person giving you the vaccine can give you more information. Td may safely be given at the same time as other vaccines. 3. Some people should not get this vaccine  A person who has ever had a life-threatening allergic reaction after a previous dose of any tetanus or diphtheria containing vaccine, OR has a severe allergy to any part of this vaccine, should not get Td vaccine. Tell the person giving the vaccine about any severe allergies.  Talk to your doctor if you: ? had severe pain or swelling after any vaccine containing diphtheria or tetanus, ? ever had a condition called Guillain Barre Syndrome (GBS), ? aren't feeling well on the day the shot   is scheduled. 4. What are the risks from Td vaccine? With any medicine, including vaccines, there is a chance of side effects. These are usually mild and go away on their own. Serious reactions are also possible but are rare. Most people who get Td vaccine do not have any problems with it. Mild problems following Td vaccine: (Did not interfere with activities)  Pain where the shot was given (about 8 people in 10)  Redness or  swelling where the shot was given (about 1 person in 4)  Mild fever (rare)  Headache (about 1 person in 4)  Tiredness (about 1 person in 4)  Moderate problems following Td vaccine: (Interfered with activities, but did not require medical attention)  Fever over 102F (rare)  Severe problems following Td vaccine: (Unable to perform usual activities; required medical attention)  Swelling, severe pain, bleeding and/or redness in the arm where the shot was given (rare).  Problems that could happen after any vaccine:  People sometimes faint after a medical procedure, including vaccination. Sitting or lying down for about 15 minutes can help prevent fainting, and injuries caused by a fall. Tell your doctor if you feel dizzy, or have vision changes or ringing in the ears.  Some people get severe pain in the shoulder and have difficulty moving the arm where a shot was given. This happens very rarely.  Any medication can cause a severe allergic reaction. Such reactions from a vaccine are very rare, estimated at fewer than 1 in a million doses, and would happen within a few minutes to a few hours after the vaccination. As with any medicine, there is a very remote chance of a vaccine causing a serious injury or death. The safety of vaccines is always being monitored. For more information, visit: www.cdc.gov/vaccinesafety/ 5. What if there is a serious reaction? What should I look for? Look for anything that concerns you, such as signs of a severe allergic reaction, very high fever, or unusual behavior. Signs of a severe allergic reaction can include hives, swelling of the face and throat, difficulty breathing, a fast heartbeat, dizziness, and weakness. These would usually start a few minutes to a few hours after the vaccination. What should I do?  If you think it is a severe allergic reaction or other emergency that can't wait, call 9-1-1 or get the person to the nearest hospital. Otherwise,  call your doctor.  Afterward, the reaction should be reported to the Vaccine Adverse Event Reporting System (VAERS). Your doctor might file this report, or you can do it yourself through the VAERS web site at www.vaers.hhs.gov, or by calling 1-800-822-7967. ? VAERS does not give medical advice. 6. The National Vaccine Injury Compensation Program The National Vaccine Injury Compensation Program (VICP) is a federal program that was created to compensate people who may have been injured by certain vaccines. Persons who believe they may have been injured by a vaccine can learn about the program and about filing a claim by calling 1-800-338-2382 or visiting the VICP website at www.hrsa.gov/vaccinecompensation. There is a time limit to file a claim for compensation. 7. How can I learn more?  Ask your doctor. He or she can give you the vaccine package insert or suggest other sources of information.  Call your local or state health department.  Contact the Centers for Disease Control and Prevention (CDC): ? Call 1-800-232-4636 (1-800-CDC-INFO) ? Visit CDC's website at www.cdc.gov/vaccines CDC Td Vaccine VIS (06/07/15) This information is not intended to replace advice given to you   by your health care provider. Make sure you discuss any questions you have with your health care provider. Document Released: 12/10/2005 Document Revised: 11/03/2015 Document Reviewed: 11/03/2015 Elsevier Interactive Patient Education  2017 [employer]  

## 2016-12-24 NOTE — Progress Notes (Signed)
Pt states she is having some bowel, kidney and back issues Pt states there is no pain there is more discomfort Pt states she is having nausea and fatigue

## 2016-12-24 NOTE — Progress Notes (Signed)
[name], female    DOB: [birth year]  MRN: [id number]  CC: New Patient (Initial Visit) and Abdominal Pain   Subjective: Colleen Downs is a 36 y.o. female who presents for new patient visit. Her concerns today include:  Pt not under treatment for any chronic medical issues.  1. Pt c/o intermittent diarrhea and constipation x 6 mths -diarrhea 3-6 x a day -when constipation, stools are hard. Sometimes rectal bleeding when she has to strain -+ bloating, crampy and nausea with meals -given Phenergan from recent ER visit but makes this her drowsiness -no dysuria -eating once a day and trying to drink more water  -had GB removed 4 yrs ago -denies any fever, chronic rash, wgh loss  2. menses not regular ever since she started having periods - regular for 2-3 mths then skips for a few mths. Use to be on BCP in past to regulate menses.  LNMP 11/11/2016. Not sexually active.   Patient Active Problem List   Diagnosis Date Noted  . Immunization due 12/24/2016     Current Outpatient Prescriptions on File Prior to Visit  Medication Sig Dispense Refill  . dicyclomine (BENTYL) 20 MG tablet Take 1 tablet (20 mg total) by mouth 2 (two) times daily. (Patient not taking: Reported on 12/24/2016) 20 tablet 0  . ondansetron (ZOFRAN ODT) 4 MG disintegrating tablet Take 1 tablet (4 mg total) by mouth every 8 (eight) hours as needed for nausea or vomiting. 20 tablet 0  . promethazine (PHENERGAN) 25 MG tablet Take 1 tablet (25 mg total) by mouth every 6 (six) hours as needed for nausea. 30 tablet 0   No current facility-administered medications on file prior to visit.     Allergies  Allergen Reactions  . Sulfa Drugs Cross Reactors Anaphylaxis    Social History   Social History  . Marital status: Single    Spouse name: N/A  . Number of children: N/A  . Years of education: N/A   Occupational History  . Not on file.   Social History Main Topics  . Smoking status: Former Research scientist (life sciences)    . Smokeless tobacco: Never Used  . Alcohol use Yes     Comment: occ  . Drug use: Yes    Types: Marijuana  . Sexual activity: No   Other Topics Concern  . Not on file   Social History Narrative  . No narrative on file    Family History  Problem Relation Age of Onset  . Rheum arthritis Mother   . Rheum arthritis Sister   . Arthritis Maternal Uncle   . Arthritis Paternal Uncle   . Diabetes Maternal Grandmother     Past Surgical History:  Procedure Laterality Date  . CHOLECYSTECTOMY      ROS: Review of Systems  Constitutional: Positive for activity change (walking daily). Negative for appetite change and unexpected weight change.  Respiratory: Negative for chest tightness and shortness of breath.   Cardiovascular: Negative for chest pain.  Gastrointestinal: Positive for constipation and diarrhea. Negative for blood in stool.  Genitourinary: Positive for menstrual problem.    PHYSICAL EXAM: BP 120/80   Pulse 82   Temp 98.4 F (36.9 C) (Oral)   Resp 16   Ht 5' 6.5" (1.689 m)   Wt 258 lb (117 kg)   SpO2 95%   BMI 41.02 kg/m   120/80 Physical Exam  General appearance - alert, well appearing, and in no distress Mental status - alert, oriented to person, place,  and time, normal mood, behavior, speech, dress, motor activity, and thought processes Mouth - mucous membranes moist, pharynx normal without lesions Neck - supple, no significant adenopathy Chest - clear to auscultation, no wheezes, rales or rhonchi, symmetric air entry Heart - normal rate, regular rhythm, normal S1, S2, no murmurs, rubs, clicks or gallops Abdomen - soft, nontender, nondistended, no masses or organomegaly Extremities - peripheral pulses normal, no pedal edema, no clubbing or cyanosis Skin: + hirsutism under chin  Results for orders placed or performed in visit on 12/24/16  HgB A1c  Result Value Ref Range   Hemoglobin A1C 7.6   Glucose (CBG)  Result Value Ref Range   POC Glucose 148  (A) 70 - 99 mg/dl    ASSESSMENT AND PLAN: 1. Irritable bowel syndrome with both constipation and diarrhea -symptoms suggest IBS -Use Imodium PRN for diarrhea and Miralax when constipation dominates - loperamide (IMODIUM A-D) 2 MG tablet; Take 1 tablet (2 mg total) by mouth 4 (four) times daily as needed for diarrhea or loose stools.  Dispense: 30 tablet; Refill: 0 - polyethylene glycol powder (GLYCOLAX/MIRALAX) powder; Take 17 g by mouth daily as needed for moderate constipation.  Dispense: 3350 g; Refill: 1  2. New onset type 2 diabetes mellitus (HCC) Discussed the importance of healthy eating habits, regular aerobic exercise (at least 150 minutes a week as tolerated) and medication compliance to achieve or maintain control of diabetes. -discuss starting her on med. Will try low dose Metformin which she may or may not tolerate given IBS.  -license clinical pharmacist to see pt to do more detail teaching for new diabetic - HgB A1c - Glucose (CBG) - Blood Glucose Monitoring Suppl (TRUE METRIX METER) w/Device KIT; Use as directed  Dispense: 1 kit; Refill: 0 - glucose blood (TRUE METRIX BLOOD GLUCOSE TEST) test strip; Use as instructed  Dispense: 100 each; Refill: 12 - TRUEPLUS LANCETS 28G MISC; Use as directed  Dispense: 100 each; Refill: 1 - metFORMIN (GLUCOPHAGE) 500 MG tablet; Take 0.5 tablets (250 mg total) by mouth daily with breakfast.  Dispense: 30 tablet; Refill: 3  3. Class 3 severe obesity due to excess calories without serious comorbidity with body mass index (BMI) of 40.0 to 44.9 in adult [facility]) -see #1 above  4. Irregular menses Likely has PCOS -recommend  Provera once a mth for a wk - medroxyPROGESTERone (PROVERA) 10 MG tablet; Take 1 tablet daily by mouth x 7 days at the beginning of each month  Dispense: 7 tablet; Refill: 4  5. Influenza vaccination declined  6. Need for Tdap vaccination given  Patient was given the opportunity to ask questions.  Patient verbalized  understanding of the plan and was able to repeat key elements of the plan.   Orders Placed This Encounter  Procedures  . Tdap vaccine greater than or equal to 7yo IM  . HgB A1c  . Glucose (CBG)     Requested Prescriptions   Signed Prescriptions Disp Refills  . medroxyPROGESTERone (PROVERA) 10 MG tablet 7 tablet 4    Sig: Take 1 tablet daily by mouth x 7 days at the beginning of each month  . loperamide (IMODIUM A-D) 2 MG tablet 30 tablet 0    Sig: Take 1 tablet (2 mg total) by mouth 4 (four) times daily as needed for diarrhea or loose stools.  . polyethylene glycol powder (GLYCOLAX/MIRALAX) powder 3350 g 1    Sig: Take 17 g by mouth daily as needed for moderate constipation.  . Blood  Glucose Monitoring Suppl (TRUE METRIX METER) w/Device KIT 1 kit 0    Sig: Use as directed  . glucose blood (TRUE METRIX BLOOD GLUCOSE TEST) test strip 100 each 12    Sig: Use as instructed  . TRUEPLUS LANCETS 28G MISC 100 each 1    Sig: Use as directed  . metFORMIN (GLUCOPHAGE) 500 MG tablet 30 tablet 3    Sig: Take 0.5 tablets (250 mg total) by mouth daily with breakfast.    Return in about 6 weeks (around 02/04/2017) for pap.  Karle Plumber, MD, FACP

## 2016-12-25 DIAGNOSIS — E119 Type 2 diabetes mellitus without complications: Secondary | ICD-10-CM | POA: Insufficient documentation

## 2016-12-25 DIAGNOSIS — Z6841 Body Mass Index (BMI) 40.0 and over, adult: Secondary | ICD-10-CM

## 2016-12-25 DIAGNOSIS — N926 Irregular menstruation, unspecified: Secondary | ICD-10-CM | POA: Insufficient documentation

## 2016-12-25 DIAGNOSIS — K582 Mixed irritable bowel syndrome: Secondary | ICD-10-CM | POA: Insufficient documentation

## 2016-12-25 DIAGNOSIS — E1169 Type 2 diabetes mellitus with other specified complication: Secondary | ICD-10-CM | POA: Insufficient documentation

## 2017-01-29 MED FILL — MEDROXYPROGESTERONE 10 MG T: 10 | 28 days supply | Qty: 7 | Fill #1

## 2017-02-04 ENCOUNTER — Other Ambulatory Visit: Payer: Self-pay | Admitting: Internal Medicine

## 2017-02-07 ENCOUNTER — Ambulatory Visit: Payer: Self-pay | Attending: Internal Medicine | Admitting: Internal Medicine

## 2017-02-07 ENCOUNTER — Ambulatory Visit: Payer: Self-pay | Attending: Internal Medicine | Admitting: Licensed Clinical Social Worker

## 2017-02-07 ENCOUNTER — Encounter: Payer: Self-pay | Admitting: Internal Medicine

## 2017-02-07 VITALS — BP 138/78 | HR 61 | Temp 98.9°F | Resp 16 | Ht 66.0 in | Wt 260.2 lb

## 2017-02-07 DIAGNOSIS — E119 Type 2 diabetes mellitus without complications: Secondary | ICD-10-CM

## 2017-02-07 DIAGNOSIS — R030 Elevated blood-pressure reading, without diagnosis of hypertension: Secondary | ICD-10-CM

## 2017-02-07 DIAGNOSIS — F331 Major depressive disorder, recurrent, moderate: Secondary | ICD-10-CM

## 2017-02-07 DIAGNOSIS — K589 Irritable bowel syndrome without diarrhea: Secondary | ICD-10-CM | POA: Insufficient documentation

## 2017-02-07 DIAGNOSIS — R21 Rash and other nonspecific skin eruption: Secondary | ICD-10-CM

## 2017-02-07 DIAGNOSIS — Z6841 Body Mass Index (BMI) 40.0 and over, adult: Secondary | ICD-10-CM | POA: Insufficient documentation

## 2017-02-07 DIAGNOSIS — N926 Irregular menstruation, unspecified: Secondary | ICD-10-CM

## 2017-02-07 DIAGNOSIS — F4323 Adjustment disorder with mixed anxiety and depressed mood: Secondary | ICD-10-CM

## 2017-02-07 DIAGNOSIS — Z79899 Other long term (current) drug therapy: Secondary | ICD-10-CM | POA: Insufficient documentation

## 2017-02-07 DIAGNOSIS — Z9049 Acquired absence of other specified parts of digestive tract: Secondary | ICD-10-CM | POA: Insufficient documentation

## 2017-02-07 DIAGNOSIS — Z882 Allergy status to sulfonamides status: Secondary | ICD-10-CM | POA: Insufficient documentation

## 2017-02-07 DIAGNOSIS — Z124 Encounter for screening for malignant neoplasm of cervix: Secondary | ICD-10-CM

## 2017-02-07 DIAGNOSIS — F17200 Nicotine dependence, unspecified, uncomplicated: Secondary | ICD-10-CM | POA: Insufficient documentation

## 2017-02-07 LAB — GLUCOSE, POCT (MANUAL RESULT ENTRY): POC GLUCOSE: 134 mg/dL — AB (ref 70–99)

## 2017-02-07 MED ORDER — TRIAMCINOLONE ACETONIDE 0.1 % EX CREA: 1.0000 "application " | TOPICAL_CREAM | Freq: Two times a day (BID) | CUTANEOUS | 0 refills | Status: DC

## 2017-02-07 NOTE — Progress Notes (Signed)
Patient stated she is depressed. Patient stated that she is not taking any medicine. Patient stated that she would like to talk about Metformin giving her a rash on her neck.

## 2017-02-07 NOTE — Patient Instructions (Signed)
Please give appointment with Stacy in 3 weeks for blood sugar recheck.  Please check your blood sugars daily and bring in readings in 3 weeks when you see the clinical pharmacist

## 2017-02-07 NOTE — Progress Notes (Signed)
[name], female    DOB: November 15, 1980  MRN: [id number]  CC: Abnormal Pap Smear   Subjective: Colleen Downs is a 36 y.o. female who presents for PAP and f/u DM Her concerns today include:  1. DM: dx on last visit. Started on Metformin -reports rash around the neck when she started taking Metformin. Some feeling of lightheadedness at times also. She stopped taking it after running out 1.5 wks ago Checked BS once a day but not over past 2 wks. Range 80-140s. Highest was 190. Lowest was 60s.  She feels Metformin made the DM worse and caused the itchy rash around her neck.  She is noted to be wearing a new weave; reports rash started before weave. No new perfumes/body products -trying to eat better "but when you don't have the resources and you too depress to move, you just do what you can."  2. C/o feeling very depress "my whole life." Dx with depression, manic-depressive ds and schizophrenia -admitted to hosp in past and to an "asylum." She was on Zoloft and Geodon -was going to [facility] seeing a psychiatrist and counselor there. . Last seen about 2 mths ago.  She stopped going because the counselor was "too aggressive" and she did not want to do group therapy.  -"I'm passive suicidal.  I don't want to be here but thoughts of killing myself or someone else, no."  -feels she does not have the  resourses to get her health under control. Works 15 hrs a wk, does not make enough to get the foods she want  3. PAP: Given Provera on last visit to take for one wk once a mth Last period was 12/27/2016 and it was heavy. Did not take Provera this mth because she did not have the money to purchase -abnormal pap in past requiring cryo procedure Last sexual activity 05/2016  Patient Active Problem List   Diagnosis Date Noted  . Irritable bowel syndrome with both constipation and diarrhea 12/25/2016  . New onset type 2 diabetes mellitus ([place]) 12/25/2016  . Class 3 severe obesity due to excess  calories without serious comorbidity with body mass index (BMI) of 40.0 to 44.9 in adult ([place]) 12/25/2016  . Irregular menses 12/25/2016  . Immunization due 12/24/2016     Current Outpatient Medications on File Prior to Visit  Medication Sig Dispense Refill  . Blood Glucose Monitoring Suppl (TRUE METRIX METER) w/Device KIT Use as directed 1 kit 0  . glucose blood (TRUE METRIX BLOOD GLUCOSE TEST) test strip Use as instructed 100 each 12  . TRUEPLUS LANCETS 28G MISC Use as directed 100 each 1  . dicyclomine (BENTYL) 20 MG tablet Take 1 tablet (20 mg total) by mouth 2 (two) times daily. (Patient not taking: Reported on 12/24/2016) 20 tablet 0  . loperamide (IMODIUM A-D) 2 MG tablet Take 1 tablet (2 mg total) by mouth 4 (four) times daily as needed for diarrhea or loose stools. (Patient not taking: Reported on 02/07/2017) 30 tablet 0  . medroxyPROGESTERone (PROVERA) 10 MG tablet Take 1 tablet daily by mouth x 7 days at the beginning of each month (Patient not taking: Reported on 02/07/2017) 7 tablet 4  . ondansetron (ZOFRAN ODT) 4 MG disintegrating tablet Take 1 tablet (4 mg total) by mouth every 8 (eight) hours as needed for nausea or vomiting. (Patient not taking: Reported on 02/07/2017) 20 tablet 0  . polyethylene glycol powder (GLYCOLAX/MIRALAX) powder Take 17 g by mouth daily as needed for  moderate constipation. (Patient not taking: Reported on 02/07/2017) 3350 g 1  . promethazine (PHENERGAN) 25 MG tablet Take 1 tablet (25 mg total) by mouth every 6 (six) hours as needed for nausea. 30 tablet 0   No current facility-administered medications on file prior to visit.     Allergies  Allergen Reactions  . Sulfa Drugs Cross Reactors Anaphylaxis    Social History   Socioeconomic History  . Marital status: Single    Spouse name: Not on file  . Number of children: Not on file  . Years of education: Not on file  . Highest education level: Not on file  Social Needs  . Financial resource  strain: Not on file  . Food insecurity - worry: Not on file  . Food insecurity - inability: Not on file  . Transportation needs - medical: Not on file  . Transportation needs - non-medical: Not on file  Occupational History  . Not on file  Tobacco Use  . Smoking status: Current Every Day Smoker  . Smokeless tobacco: Never Used  Substance and Sexual Activity  . Alcohol use: Yes    Comment: occ  . Drug use: Yes    Types: Marijuana  . Sexual activity: No    Birth control/protection: None  Other Topics Concern  . Not on file  Social History Narrative  . Not on file    Family History  Problem Relation Age of Onset  . Rheum arthritis Mother   . Rheum arthritis Sister   . Arthritis Maternal Uncle   . Arthritis Paternal Uncle   . Diabetes Maternal Grandmother     Past Surgical History:  Procedure Laterality Date  . CHOLECYSTECTOMY      ROS: Review of Systems As above PHYSICAL EXAM: BP 138/78 (BP Location: Left Arm, Patient Position: Sitting, Cuff Size: Large)   Pulse 61   Temp 98.9 F (37.2 C) (Oral)   Resp 16   Ht _0  (1.676 m)   Wt 260 lb 3.2 oz (118 kg)   LMP 12/27/2016   SpO2 100%   BMI 42.00 kg/m   132/80 Physical Exam  General appearance - alert, well appearing, and in no distress Mental status - alert, oriented to person, place, and time, normal mood, behavior, speech, dress, motor activity, and thought processes Pelvic -no external lesions. Cervix appears grossly normal. No CMT or adnexal masses.  Skin: atopic rash around the neck line Depression screen [facility] 2/9 02/07/2017  Decreased Interest 2  Down, Depressed, Hopeless 3  PHQ - 2 Score 5  Altered sleeping 2  Tired, decreased energy 3  Change in appetite 2  Feeling bad or failure about yourself  2  Trouble concentrating 0  Moving slowly or fidgety/restless 0  Suicidal thoughts 1  PHQ-9 Score 15     ASSESSMENT AND PLAN: 1. New onset type 2 diabetes mellitus ([place]) -will d/c Metformin since  she thinks it has caused the neck rash. Asked her to keep daily log of BS and return in 3 wks to see clinical pharmacist.  If not at goal, will start [place]. Pt has sulfa allergy so Glucotrol not an option - Glucose (CBG)  2. Pap smear for cervical cancer screening - HIV antibody - Cytology - PAP  3. Irregular menses -continue the 1 wk monthly Provera  -likely has PCOS  4. Moderate episode of recurrent major depressive disorder ([place]) -pt declines starting med for depression. I will have LCSW see her today for eval and to get  her referred for mental health services.  5. Rash of neck - triamcinolone cream (KENALOG) 0.1 %; Apply 1 application topically 2 (two) times daily.  Dispense: 30 g; Refill: 0  6. Borderline hypertension -recommend low salt diet   Patient was given the opportunity to ask questions.  Patient verbalized understanding of the plan and was able to repeat key elements of the plan.   Orders Placed This Encounter  Procedures  . HIV antibody  . Glucose (CBG)     Requested Prescriptions   Signed Prescriptions Disp Refills  . triamcinolone cream (KENALOG) 0.1 % 30 g 0    Sig: Apply 1 application topically 2 (two) times daily.    F/u in 2 mths Karle Plumber, MD, Rosalita Chessman

## 2017-02-08 LAB — CERVICOVAGINAL ANCILLARY ONLY
CHLAMYDIA, DNA PROBE: NEGATIVE
Neisseria Gonorrhea: NEGATIVE
Trichomonas: NEGATIVE

## 2017-02-08 LAB — HIV ANTIBODY (ROUTINE TESTING W REFLEX): HIV Screen 4th Generation wRfx: NONREACTIVE

## 2017-02-08 NOTE — BH Specialist Note (Signed)
Integrated Behavioral Health Initial Visit  MRN: [id number] Name: Jawana Reagor  Number of [place] Clinician visits:: 1/6 Session Start time: 3:20 PM  Session End time: 4:00 PM Total time: 40 minutes  Type of Service: [place] Interpretor:No. Interpretor Name and Language: N/A   Warm Hand Off Completed.       SUBJECTIVE: Deonna Krummel is a 36 y.o. female accompanied by self Patient was referred by Dr. Wynetta Emery for depression and anxiety. Patient reports the following symptoms/concerns: overwhelming feelings of sadness, difficulty sleeping, low energy, irritability, and hx of suicidal ideations Duration of problem: "since I was a teenager"; Severity of problem: moderate  OBJECTIVE: Mood: Irritable and Affect: Appropriate Risk of harm to self or others: No plan to harm self or others Pt reports hx of SI; however, denies plan or intent to harm self or others  LIFE CONTEXT: Family and Social: Pt resides with her parents (she has strained relationship with mother)  School/Work: Pt is employed part time. She is interested in applying for food stamps Self-Care: Pt smokes marijuana daily to cope with stressors Life Changes: Pt was recently diagnosed with diabetes. She is having difficulty managing physical and mental health  GOALS ADDRESSED: Patient will: 1. Reduce symptoms of: agitation, anxiety and depression 2. Increase knowledge and/or ability of: coping skills  3. Demonstrate ability to: Increase healthy adjustment to current life circumstances, Increase adequate support systems for patient/family and Decrease self-medicating behaviors  INTERVENTIONS: Interventions utilized: Solution-Focused Strategies, Supportive Counseling, Psychoeducation and/or Health Education and Link to [employer]  Standardized Assessments completed: GAD-7 and PHQ 2&9 with C-SSRS  ASSESSMENT: Patient currently experiencing depression and  anxiety triggered by recent diagnosis of diabetes and strained relationship with mother. She reports overwhelming feelings of sadness, difficulty sleeping, low energy, irritability, and hx of suicidal ideations. Pt has a long hx of trauma and receives limited support. She reports SI; however, denies intent to harm self or others.    Patient may benefit from psychoeducation, psychotherapy, and medication management. LCSWA educated pt on the correlation between one's physical and mental health. LCSWA discussed with pt benefits of applying healthy coping skills. Pt had good insight on the negative impact substance use has had on her mental health and is open to initiating behavioral health services. Pt agreed to referral to [facility] to assist with co-occurring disorder. LCSWA provided resources on crisis intervention, housing, and food insecurity.   PLAN: 1. Follow up with behavioral health clinician on : Pt was encouraged to contact LCSWA if symptoms worsen or fail to improve to schedule behavioral appointments at [facility]. 2. Behavioral recommendations: LCSWA recommends that pt apply healthy coping skills discussed and utilize provided resources. Pt is encouraged to schedule follow up appointment with LCSWA 3. Referral(s): Community Resources:  Retail banker 4. "From scale of 1-10, how likely are you to follow plan?": 9/10  Rebekah Chesterfield, LCSW 02/08/17 4:11 PM

## 2017-02-11 LAB — CYTOLOGY - PAP
Diagnosis: NEGATIVE
HPV: NOT DETECTED

## 2017-02-12 ENCOUNTER — Telehealth: Payer: Self-pay | Admitting: *Deleted

## 2017-02-12 ENCOUNTER — Ambulatory Visit: Payer: Self-pay | Admitting: Pharmacist

## 2017-02-12 NOTE — Telephone Encounter (Signed)
-----   Message from Ladell Pier, MD sent at 02/08/2017  9:07 PM EST ----- Let pt know that HIV test and screen for Chlamydia, gonorrhea and trichomonas are negative.

## 2017-02-12 NOTE — Telephone Encounter (Signed)
Patient verified DOB Patient is aware of STI's being negative. No further questions.

## 2017-02-14 ENCOUNTER — Ambulatory Visit: Payer: Self-pay | Attending: Internal Medicine | Admitting: Pharmacist

## 2017-02-14 ENCOUNTER — Ambulatory Visit: Payer: Self-pay | Admitting: Licensed Clinical Social Worker

## 2017-02-14 ENCOUNTER — Encounter: Payer: Self-pay | Admitting: Pharmacist

## 2017-02-14 DIAGNOSIS — Z5941 Food insecurity: Secondary | ICD-10-CM

## 2017-02-14 DIAGNOSIS — F4323 Adjustment disorder with mixed anxiety and depressed mood: Secondary | ICD-10-CM

## 2017-02-14 DIAGNOSIS — E119 Type 2 diabetes mellitus without complications: Secondary | ICD-10-CM | POA: Insufficient documentation

## 2017-02-14 DIAGNOSIS — Z594 Lack of adequate food and safe drinking water: Secondary | ICD-10-CM

## 2017-02-14 NOTE — Progress Notes (Signed)
    S:     No chief complaint on file.   Patient arrives in somber spirits.  Presents for diabetes education per patient's request. Was referred to me to be seen in 2 weeks for diabetes management by Dr. Wynetta Emery.   Patient reports Diabetes was diagnosed in a few months ago.   Patient denies adherence with medications.  Current diabetes medications include: she was on metformin but stopped it due to rash. Dr. Wynetta Emery would like to consider Januvia for her if she needs medication.  Patient reports that rash has resolved for the most part after putting cream on it. It was on her neck and under her breasts. She is convinced it is metformin and not the hair products used when she got a new weave.   Patient reports hypoglycemic events.  Patient reported dietary habits: eats maybe once a day due to depression.   She reports that she does not have the energy to exercise.  She is very concerned about her upset stomach and cramping. She says she was diagnosed with IBS but still has issues. She would like to see a specialist.   O:  Physical Exam   ROS   Lab Results  Component Value Date   HGBA1C 7.6 12/24/2016   There were no vitals filed for this visit.  Home fasting CBG: 80s-120s 2 hour post-prandial/random CBG: 110s-190s   A/P: Diabetes newly diagnosed currently uncontrolled based on A1c of 7.6. Patient reports hypoglycemic events and is able to verbalize appropriate hypoglycemia management plan. Patient reports adherence with medication. Control is suboptimal due to poor appetite and depression.  Provided diabetes education. Based on blood sugars, would not add medication at this time. She does not have enough readings off of metformin but what she does have is controlled. I am not sure that the metformin caused the rash but since it resolved once she stopped it, it could be. Like Dr. Wynetta Emery said in her note, I would start Januvia if she does need a medication. Patient will  plan to check blood sugars more frequently. Encouraged patient to eat snacks even if she cannot eat meals since not eating can cause her body to make glucose and cause the body stress.  Patient to see Dr. Jarold Song in January. Seen by Christa See, LCSW today. Encouraged patient to make appt with financial counseling to get [facility] discount in case she is referred to a specialist.   Next A1C anticipated February 2019.    Written patient instructions provided.  Total time in face to face counseling 20 minutes.

## 2017-02-14 NOTE — Patient Instructions (Signed)
Thanks for coming to see me  No medications for your diabetes right now.   Try to eat 3 meals a day, even if they are small.  Come back and see Dr. Jarold Song in January   Diabetes Mellitus and Nutrition When you have diabetes (diabetes mellitus), it is very important to have healthy eating habits because your blood sugar (glucose) levels are greatly affected by what you eat and drink. Eating healthy foods in the appropriate amounts, at about the same times every day, can help you:  Control your blood glucose.  Lower your risk of heart disease.  Improve your blood pressure.  Reach or maintain a healthy weight.  Every person with diabetes is different, and each person has different needs for a meal plan. Your health care provider may recommend that you work with a diet and nutrition specialist (dietitian) to make a meal plan that is best for you. Your meal plan may vary depending on factors such as:  The calories you need.  The medicines you take.  Your weight.  Your blood glucose, blood pressure, and cholesterol levels.  Your activity level.  Other health conditions you have, such as heart or kidney disease.  How do carbohydrates affect me? Carbohydrates affect your blood glucose level more than any other type of food. Eating carbohydrates naturally increases the amount of glucose in your blood. Carbohydrate counting is a method for keeping track of how many carbohydrates you eat. Counting carbohydrates is important to keep your blood glucose at a healthy level, especially if you use insulin or take certain oral diabetes medicines. It is important to know how many carbohydrates you can safely have in each meal. This is different for every person. Your dietitian can help you calculate how many carbohydrates you should have at each meal and for snack. Foods that contain carbohydrates include:  Bread, cereal, rice, pasta, and crackers.  Potatoes and corn.  Peas, beans, and  lentils.  Milk and yogurt.  Fruit and juice.  Desserts, such as cakes, cookies, ice cream, and candy.  How does alcohol affect me? Alcohol can cause a sudden decrease in blood glucose (hypoglycemia), especially if you use insulin or take certain oral diabetes medicines. Hypoglycemia can be a life-threatening condition. Symptoms of hypoglycemia (sleepiness, dizziness, and confusion) are similar to symptoms of having too much alcohol. If your health care provider says that alcohol is safe for you, follow these guidelines:  Limit alcohol intake to no more than 1 drink per day for nonpregnant women and 2 drinks per day for men. One drink equals 12 oz of beer, 5 oz of wine, or 1 oz of hard liquor.  Do not drink on an empty stomach.  Keep yourself hydrated with water, diet soda, or unsweetened iced tea.  Keep in mind that regular soda, juice, and other mixers may contain a lot of sugar and must be counted as carbohydrates.  What are tips for following this plan? Reading food labels  Start by checking the serving size on the label. The amount of calories, carbohydrates, fats, and other nutrients listed on the label are based on one serving of the food. Many foods contain more than one serving per package.  Check the total grams (g) of carbohydrates in one serving. You can calculate the number of servings of carbohydrates in one serving by dividing the total carbohydrates by 15. For example, if a food has 30 g of total carbohydrates, it would be equal to 2 servings of carbohydrates.  Check the number of grams (g) of saturated and trans fats in one serving. Choose foods that have low or no amount of these fats.  Check the number of milligrams (mg) of sodium in one serving. Most people should limit total sodium intake to less than 2,300 mg per day.  Always check the nutrition information of foods labeled as "low-fat" or "nonfat". These foods may be higher in added sugar or refined carbohydrates  and should be avoided.  Talk to your dietitian to identify your daily goals for nutrients listed on the label. Shopping  Avoid buying canned, premade, or processed foods. These foods tend to be high in fat, sodium, and added sugar.  Shop around the outside edge of the grocery store. This includes fresh fruits and vegetables, bulk grains, fresh meats, and fresh dairy. Cooking  Use low-heat cooking methods, such as baking, instead of high-heat cooking methods like deep frying.  Cook using healthy oils, such as olive, canola, or sunflower oil.  Avoid cooking with butter, cream, or high-fat meats. Meal planning  Eat meals and snacks regularly, preferably at the same times every day. Avoid going long periods of time without eating.  Eat foods high in fiber, such as fresh fruits, vegetables, beans, and whole grains. Talk to your dietitian about how many servings of carbohydrates you can eat at each meal.  Eat 4-6 ounces of lean protein each day, such as lean meat, chicken, fish, eggs, or tofu. 1 ounce is equal to 1 ounce of meat, chicken, or fish, 1 egg, or 1/4 cup of tofu.  Eat some foods each day that contain healthy fats, such as avocado, nuts, seeds, and fish. Lifestyle   Check your blood glucose regularly.  Exercise at least 30 minutes 5 or more days each week, or as told by your health care provider.  Take medicines as told by your health care provider.  Do not use any products that contain nicotine or tobacco, such as cigarettes and e-cigarettes. If you need help quitting, ask your health care provider.  Work with a Social worker or diabetes educator to identify strategies to manage stress and any emotional and social challenges. What are some questions to ask my health care provider?  Do I need to meet with a diabetes educator?  Do I need to meet with a dietitian?  What number can I call if I have questions?  When are the best times to check my blood glucose? Where to find  more information:  American Diabetes Association: diabetes.org/food-and-fitness/food  Academy of Nutrition and Dietetics: PokerClues.dk  [employer] of Diabetes and Digestive and Kidney Diseases (NIH): ContactWire.be Summary  A healthy meal plan will help you control your blood glucose and maintain a healthy lifestyle.  Working with a diet and nutrition specialist (dietitian) can help you make a meal plan that is best for you.  Keep in mind that carbohydrates and alcohol have immediate effects on your blood glucose levels. It is important to count carbohydrates and to use alcohol carefully. This information is not intended to replace advice given to you by your health care provider. Make sure you discuss any questions you have with your health care provider. Document Released: 11/09/2004 Document Revised: 03/19/2016 Document Reviewed: 03/19/2016 Elsevier Interactive Patient Education  [employer].

## 2017-02-15 ENCOUNTER — Telehealth: Payer: Self-pay | Admitting: *Deleted

## 2017-02-15 NOTE — Telephone Encounter (Signed)
Medical Assistant left message on patient's home and cell voicemail. Voicemail states to give a call back to [place] with [facility] at [phone]. !!Please inform patient of PAP being normal and a repeat is completed in 3-5 years and all STD screenings including HIV were negative!!!

## 2017-02-15 NOTE — Telephone Encounter (Signed)
-----   Message from Ladell Pier, MD sent at 02/11/2017  8:53 PM EST ----- Pap is normal. Repeat again in 3-5 yrs.

## 2017-02-15 NOTE — BH Specialist Note (Signed)
Integrated Behavioral Health Initial Visit  MRN: [id number] Name: Colleen Downs  Number of [place] Clinician visits:: 2/6 Session Start time: 2:35 PM  Session End time: 2:50 PM Total time: 15 minutes  Type of Service: [place] Interpretor:No. Interpretor Name and Language: N/A   Warm Hand Off Completed.       SUBJECTIVE: Colleen Downs is a 36 y.o. female accompanied by self Patient was referred by Dr. Wynetta Emery for depression and anxiety. Patient reports the following symptoms/concerns: overwhelming feelings of sadness, difficulty sleeping, low energy, irritability, and hx of suicidal ideations Duration of problem: "since I was a teenager"; Severity of problem: moderate  OBJECTIVE: Mood: Pleasant and Affect: Appropriate Risk of harm to self or others: No plan to harm self or others Pt reports hx of SI; however, denies plan or intent to harm self or others  LIFE CONTEXT: (No Changes) Family and Social: Pt resides with her parents (she has strained relationship with mother)  School/Work: Pt is employed part time. She is interested in applying for food stamps Self-Care: Pt smokes marijuana daily to cope with stressors Life Changes: Pt was recently diagnosed with diabetes. She is having difficulty managing physical and mental health  GOALS ADDRESSED: Patient will: 1. Reduce symptoms of: agitation, anxiety and depression 2. Increase knowledge and/or ability of: coping skills  3. Demonstrate ability to: Increase healthy adjustment to current life circumstances, Increase adequate support systems for patient/family and Decrease self-medicating behaviors  INTERVENTIONS: Interventions utilized: Solution-Focused Strategies, Supportive Counseling, Psychoeducation and/or Health Education and Link to [employer]  Standardized Assessments completed: Not Needed  ASSESSMENT: Patient currently experiencing depression and anxiety  triggered by recent diagnosis of diabetes and strained relationship with mother. She reports overwhelming feelings of sadness, difficulty sleeping, low energy, irritability, and hx of suicidal ideations. Pt has a long hx of trauma and receives limited support. She reports SI; however, denies intent to harm self or others.    Patient met with CPP Hammer to discuss concerns related to diabetes. Patient shared that she felt comfortable asking questions and feels that she was able to come up with a good plan to manage diagnosis, with the assistance of CPP Hammer. LCSWA provided a food box and referred pt to [facility] [employer] [facility] to address food insecurity. Pt appreciative for assistance.    PLAN: 1. Follow up with behavioral health clinician on : Pt was encouraged to contact LCSWA if symptoms worsen or fail to improve to schedule behavioral appointments at [facility]. 2. Behavioral recommendations: LCSWA recommends that pt apply healthy coping skills discussed and utilize provided resources. Pt is encouraged to schedule follow up appointment with LCSWA 3. Referral(s): Community Resources:  Food 4. "From scale of 1-10, how likely are you to follow plan?": 10/10  Rebekah Chesterfield, LCSW 02/15/17 11:26 AM

## 2017-03-19 ENCOUNTER — Ambulatory Visit: Payer: Self-pay | Attending: Family Medicine | Admitting: Family Medicine

## 2017-03-19 ENCOUNTER — Encounter: Payer: Self-pay | Admitting: Family Medicine

## 2017-03-19 VITALS — BP 136/82 | HR 70 | Temp 98.6°F | Ht 66.0 in | Wt 256.6 lb

## 2017-03-19 DIAGNOSIS — F319 Bipolar disorder, unspecified: Secondary | ICD-10-CM | POA: Insufficient documentation

## 2017-03-19 DIAGNOSIS — F3130 Bipolar disorder, current episode depressed, mild or moderate severity, unspecified: Secondary | ICD-10-CM | POA: Insufficient documentation

## 2017-03-19 DIAGNOSIS — Z79899 Other long term (current) drug therapy: Secondary | ICD-10-CM | POA: Insufficient documentation

## 2017-03-19 DIAGNOSIS — E119 Type 2 diabetes mellitus without complications: Secondary | ICD-10-CM

## 2017-03-19 DIAGNOSIS — K648 Other hemorrhoids: Secondary | ICD-10-CM | POA: Insufficient documentation

## 2017-03-19 DIAGNOSIS — K582 Mixed irritable bowel syndrome: Secondary | ICD-10-CM | POA: Insufficient documentation

## 2017-03-19 DIAGNOSIS — R197 Diarrhea, unspecified: Secondary | ICD-10-CM | POA: Insufficient documentation

## 2017-03-19 DIAGNOSIS — N926 Irregular menstruation, unspecified: Secondary | ICD-10-CM | POA: Insufficient documentation

## 2017-03-19 DIAGNOSIS — E11649 Type 2 diabetes mellitus with hypoglycemia without coma: Secondary | ICD-10-CM | POA: Insufficient documentation

## 2017-03-19 LAB — GLUCOSE, POCT (MANUAL RESULT ENTRY): POC Glucose: 122 mg/dl — AB (ref 70–99)

## 2017-03-19 LAB — POCT GLYCOSYLATED HEMOGLOBIN (HGB A1C): HEMOGLOBIN A1C: 7.3

## 2017-03-19 MED ORDER — DICYCLOMINE HCL 10 MG PO CAPS: 10.0000 mg | ORAL_CAPSULE | Freq: Three times a day (TID) | ORAL | 1 refills | Status: DC

## 2017-03-19 MED ORDER — HYDROCORTISONE ACE-PRAMOXINE 1-1 % RE CREA: 1.0000 "application " | TOPICAL_CREAM | Freq: Two times a day (BID) | RECTAL | 1 refills | Status: DC

## 2017-03-19 NOTE — Patient Instructions (Signed)
Irritable Bowel Syndrome, Adult Irritable bowel syndrome (IBS) is not one specific disease. It is a group of symptoms that affects the organs responsible for digestion (gastrointestinal or GI tract). To regulate how your GI tract works, your body sends signals back and forth between your intestines and your brain. If you have IBS, there may be a problem with these signals. As a result, your GI tract does not function normally. Your intestines may become more sensitive and overreact to certain things. This is especially true when you eat certain foods or when you are under stress. There are four types of IBS. These may be determined based on the consistency of your stool:  IBS with diarrhea.  IBS with constipation.  Mixed IBS.  Unsubtyped IBS.  It is important to know which type of IBS you have. Some treatments are more likely to be helpful for certain types of IBS. What are the causes? The exact cause of IBS is not known. What increases the risk? You may have a higher risk of IBS if:  You are a woman.  You are younger than 37 years old.  You have a family history of IBS.  You have mental health problems.  You have had bacterial infection of your GI tract.  What are the signs or symptoms? Symptoms of IBS vary from person to person. The main symptom is abdominal pain or discomfort. Additional symptoms usually include one or more of the following:  Diarrhea, constipation, or both.  Abdominal swelling or bloating.  Feeling full or sick after eating a small or regular-size meal.  Frequent gas.  Mucus in the stool.  A feeling of having more stool left after a bowel movement.  Symptoms tend to come and go. They may be associated with stress, psychiatric conditions, or nothing at all. How is this diagnosed? There is no specific test to diagnose IBS. Your health care provider will make a diagnosis based on a physical exam, medical history, and your symptoms. You may have other  tests to rule out other conditions that may be causing your symptoms. These may include:  Blood tests.  X-rays.  CT scan.  Endoscopy and colonoscopy. This is a test in which your GI tract is viewed with a long, thin, flexible tube.  How is this treated? There is no cure for IBS, but treatment can help relieve symptoms. IBS treatment often includes:  Changes to your diet, such as: ? Eating more fiber. ? Avoiding foods that cause symptoms. ? Drinking more water. ? Eating regular, medium-sized portioned meals.  Medicines. These may include: ? Fiber supplements if you have constipation. ? Medicine to control diarrhea (antidiarrheal medicines). ? Medicine to help control muscle spasms in your GI tract (antispasmodic medicines). ? Medicines to help with any mental health issues, such as antidepressants or tranquilizers.  Therapy. ? Talk therapy may help with anxiety, depression, or other mental health issues that can make IBS symptoms worse.  Stress reduction. ? Managing your stress can help keep symptoms under control.  Follow these instructions at home:  Take medicines only as directed by your health care provider.  Eat a healthy diet. ? Avoid foods and drinks with added sugar. ? Include more whole grains, fruits, and vegetables gradually into your diet. This may be especially helpful if you have IBS with constipation. ? Avoid any foods and drinks that make your symptoms worse. These may include dairy products and caffeinated or carbonated drinks. ? Do not eat large meals. ? Drink enough   fluid to keep your urine clear or pale yellow.  Exercise regularly. Ask your health care provider for recommendations of good activities for you.  Keep all follow-up visits as directed by your health care provider. This is important. Contact a health care provider if:  You have constant pain.  You have trouble or pain with swallowing.  You have worsening diarrhea. Get help right away  if:  You have severe and worsening abdominal pain.  You have diarrhea and: ? You have a rash, stiff neck, or severe headache. ? You are irritable, sleepy, or difficult to awaken. ? You are weak, dizzy, or extremely thirsty.  You have bright red blood in your stool or you have black tarry stools.  You have unusual abdominal swelling that is painful.  You vomit continuously.  You vomit blood (hematemesis).  You have both abdominal pain and a fever. This information is not intended to replace advice given to you by your health care provider. Make sure you discuss any questions you have with your health care provider. Document Released: 02/12/2005 Document Revised: 07/15/2015 Document Reviewed: 10/30/2013 Elsevier Interactive Patient Education  2018 Elsevier Inc.  

## 2017-03-19 NOTE — Progress Notes (Signed)
Subjective:  [name], female    DOB: May 11, 1980  Age: 37 y.o. MRN: [id number]  CC: Diabetes  HPI Colleen Downs is a 37 year old female with a PMH of type 2 diabetes that presents today for follow-up. She was recently diagnosed with Type 2 Diabetes and was prescribed metformin, but it resulted in a rash, so it was discontinued. Rash has improved but she still doesn't feel well. She reports having hypoglycemic symptoms such as tremors and shaking in the morning. She is currently not on medication and not checking her blood sugars at home. Reports adhering to a healthier diet and starting to exercise. She is interested in losing weight and is requesting medication to help with that.   She has a history of IBS and reports diarrhea 4-7x a day. She did not fill the imodium prescription from her last visit. She has developed a hemorrhoid and is currently treating it with preparation H. Admits to abdominal pain and reports constipation at times.  She has a history of depression and bipolar disorder. She states that "I just don't want to be alive sometimes" and admits to suicidal ideation, she denies having a plan and denies attempting suicide. She has not follow-up with the resources given to her by social work stating that "I don"t want to be a burden for anyone"  She mentioned that she has irregular periods and is taking Provera. She would like a refill of the medication.   Past Medical History:  Diagnosis Date  . Diarrhea   . Irregular periods    Past Surgical History:  Procedure Laterality Date  . CHOLECYSTECTOMY     Allergies  Allergen Reactions  . Sulfa Drugs Cross Reactors Anaphylaxis    Outpatient Medications Prior to Visit  Medication Sig Dispense Refill  . medroxyPROGESTERone (PROVERA) 10 MG tablet Take 1 tablet daily by mouth x 7 days at the beginning of each month 7 tablet 4  . Blood Glucose Monitoring Suppl (TRUE METRIX METER) w/Device KIT Use as directed  (Patient not taking: Reported on 03/19/2017) 1 kit 0  . glucose blood (TRUE METRIX BLOOD GLUCOSE TEST) test strip Use as instructed (Patient not taking: Reported on 03/19/2017) 100 each 12  . loperamide (IMODIUM A-D) 2 MG tablet Take 1 tablet (2 mg total) by mouth 4 (four) times daily as needed for diarrhea or loose stools. (Patient not taking: Reported on 02/07/2017) 30 tablet 0  . ondansetron (ZOFRAN ODT) 4 MG disintegrating tablet Take 1 tablet (4 mg total) by mouth every 8 (eight) hours as needed for nausea or vomiting. (Patient not taking: Reported on 02/07/2017) 20 tablet 0  . polyethylene glycol powder (GLYCOLAX/MIRALAX) powder Take 17 g by mouth daily as needed for moderate constipation. (Patient not taking: Reported on 02/07/2017) 3350 g 1  . promethazine (PHENERGAN) 25 MG tablet Take 1 tablet (25 mg total) by mouth every 6 (six) hours as needed for nausea. 30 tablet 0  . triamcinolone cream (KENALOG) 0.1 % Apply 1 application topically 2 (two) times daily. (Patient not taking: Reported on 03/19/2017) 30 g 0  . TRUEPLUS LANCETS 28G MISC Use as directed (Patient not taking: Reported on 03/19/2017) 100 each 1  . dicyclomine (BENTYL) 20 MG tablet Take 1 tablet (20 mg total) by mouth 2 (two) times daily. (Patient not taking: Reported on 12/24/2016) 20 tablet 0   No facility-administered medications prior to visit.     ROS Review of Systems  Constitutional: Negative for activity change and fever.  HENT: Negative.   Respiratory: Negative for cough and shortness of breath.   Cardiovascular: Negative for chest pain.  Gastrointestinal: Positive for constipation and diarrhea.       R/t IBS  Genitourinary: Positive for menstrual problem.       Irregular menses  Neurological: Positive for dizziness and tremors.       She report this when her blood sugar low  Psychiatric/Behavioral: Negative for self-injury.       Reports depression    Objective:  BP 136/82   Pulse 70   Temp 98.6 F (37 C)  (Oral)   Ht '5\' 6"'$  (1.676 m)   Wt 256 lb 9.6 oz (116.4 kg)   SpO2 100%   BMI 41.42 kg/m   BP/Weight 03/19/2017 02/07/2017 23/53/6144  Systolic BP 315 400 867  Diastolic BP 82 78 80  Wt. (Lbs) 256.6 260.2 258  BMI 41.42 42 41.02   Lab Results  Component Value Date   HGBA1C 7.3 03/19/2017   Physical Exam  Constitutional: She is oriented to person, place, and time. She appears well-developed and well-nourished.  HENT:  Head: Normocephalic.  Neck: Normal range of motion. Neck supple.  Cardiovascular: Normal rate, regular rhythm and normal heart sounds.  Pulmonary/Chest: Effort normal and breath sounds normal.  Abdominal: Soft. Bowel sounds are normal. She exhibits no distension.  Genitourinary:  Genitourinary Comments: Refused rectal and vaginal examination   Musculoskeletal: Normal range of motion.  Neurological: She is alert and oriented to person, place, and time.  Skin: Skin is warm and dry.  Psychiatric: She is not agitated. She exhibits a depressed mood. She expresses no suicidal plans.    Assessment & Plan:   1. New onset type 2 diabetes mellitus ([place]) Discussed keeping a blood sugar log for the next 2 weeks. Considering Victoza to aid with DM management and weight loss.  Discussed diabetic diet and exercise - POCT glucose (manual entry) - POCT glycosylated hemoglobin (Hb A1C)  2. Irritable bowel syndrome with both constipation and diarrhea - dicyclomine (BENTYL) 10 MG capsule; Take 1 capsule (10 mg total) by mouth 3 (three) times daily before meals.  Dispense: 90 capsule; Refill: 1  3. Irregular menses Hold Provera for now. Will evaluate for PCOS with a Pelvic US at next visit.     4. Other hemorrhoids - pramoxine-hydrocortisone (ANALPRAM-HC) 1-1 % rectal cream; Place 1 application rectally 2 (two) times daily.  Dispense: 30 g; Refill: 1  5. Bipolar depression ([place]) Not in crisis for now. Discussed importance of following-up and reassured patient that she was not  a burden.  [facility] Education officer, museum. Patient states she will call them to follow-up.   Meds ordered this encounter  Medications  . pramoxine-hydrocortisone (ANALPRAM-HC) 1-1 % rectal cream    Sig: Place 1 application rectally 2 (two) times daily.    Dispense:  30 g    Refill:  1  . dicyclomine (BENTYL) 10 MG capsule    Sig: Take 1 capsule (10 mg total) by mouth 3 (three) times daily before meals.    Dispense:  90 capsule    Refill:  1    Follow-up: Return in about 2 weeks (around 04/02/2017) for Follow up on diabetes mellitus and irregular periods.

## 2017-04-02 ENCOUNTER — Encounter: Payer: Self-pay | Admitting: Family Medicine

## 2017-04-02 ENCOUNTER — Ambulatory Visit: Payer: Self-pay | Attending: Family Medicine | Admitting: Family Medicine

## 2017-04-02 VITALS — BP 122/82 | HR 68 | Temp 98.3°F | Ht 66.0 in | Wt 251.0 lb

## 2017-04-02 DIAGNOSIS — Z882 Allergy status to sulfonamides status: Secondary | ICD-10-CM | POA: Insufficient documentation

## 2017-04-02 DIAGNOSIS — F319 Bipolar disorder, unspecified: Secondary | ICD-10-CM

## 2017-04-02 DIAGNOSIS — N926 Irregular menstruation, unspecified: Secondary | ICD-10-CM

## 2017-04-02 DIAGNOSIS — E66813 Obesity, class 3: Secondary | ICD-10-CM

## 2017-04-02 DIAGNOSIS — Z9049 Acquired absence of other specified parts of digestive tract: Secondary | ICD-10-CM | POA: Insufficient documentation

## 2017-04-02 DIAGNOSIS — Z79899 Other long term (current) drug therapy: Secondary | ICD-10-CM | POA: Insufficient documentation

## 2017-04-02 DIAGNOSIS — F3130 Bipolar disorder, current episode depressed, mild or moderate severity, unspecified: Secondary | ICD-10-CM

## 2017-04-02 DIAGNOSIS — Z6841 Body Mass Index (BMI) 40.0 and over, adult: Secondary | ICD-10-CM

## 2017-04-02 DIAGNOSIS — E119 Type 2 diabetes mellitus without complications: Secondary | ICD-10-CM

## 2017-04-02 LAB — GLUCOSE, POCT (MANUAL RESULT ENTRY): POC GLUCOSE: 94 mg/dL (ref 70–99)

## 2017-04-02 MED ORDER — LIRAGLUTIDE 18 MG/3ML ~~LOC~~ SOPN: 0.6000 mg | PEN_INJECTOR | Freq: Every day | SUBCUTANEOUS | 3 refills | Status: DC

## 2017-04-02 MED ORDER — INSULIN PEN NEEDLE 31G X 5 MM MISC: 1.0000 | Freq: Every day | 5 refills | Status: DC

## 2017-04-02 NOTE — Progress Notes (Signed)
Subjective:  [name], female    DOB: 1981-02-02  Age: 37 y.o. MRN: [id number]  CC: Diabetes   HPI [name] is a 37 year old female with a history of newly diagnosed type 2 diabetes mellitus (A1c 7.3), obesity, bipolar disorder who presents today for follow-up visit.  She was previously on metformin by her PCP but developed a rash with this and so discontinued it.  Her blood sugar log today reveals fasting sugars between 82 and 134. She denies hypoglycemia or visual concerns. She is worried about her weight and would like help in losing weight as she has tried exercise and is modifying her diet with no much results.  She complains of irregular menstrual periods, LMP was 03/12/17.  Her menstrual periods are heavy and painful and associated with passage of clots intermittently. She denies lightheadedness.  With regards to her bipolar disorder we had discussed follow-up at [facility] for mental health care however she informs me she did not do well on medications in the past and does not feel comfortable with other counselors. States that when she feels depressed she confides in her father who has been her Social worker.  Past Medical History:  Diagnosis Date  . Diarrhea   . Irregular periods     Past Surgical History:  Procedure Laterality Date  . CHOLECYSTECTOMY      Allergies  Allergen Reactions  . Sulfa Drugs Cross Reactors Anaphylaxis     Outpatient Medications Prior to Visit  Medication Sig Dispense Refill  . Blood Glucose Monitoring Suppl (TRUE METRIX METER) w/Device KIT Use as directed (Patient not taking: Reported on 03/19/2017) 1 kit 0  . dicyclomine (BENTYL) 10 MG capsule Take 1 capsule (10 mg total) by mouth 3 (three) times daily before meals. (Patient not taking: Reported on 04/02/2017) 90 capsule 1  . glucose blood (TRUE METRIX BLOOD GLUCOSE TEST) test strip Use as instructed (Patient not taking: Reported on 03/19/2017) 100 each 12  . loperamide (IMODIUM  A-D) 2 MG tablet Take 1 tablet (2 mg total) by mouth 4 (four) times daily as needed for diarrhea or loose stools. (Patient not taking: Reported on 02/07/2017) 30 tablet 0  . medroxyPROGESTERone (PROVERA) 10 MG tablet Take 1 tablet daily by mouth x 7 days at the beginning of each month (Patient not taking: Reported on 04/02/2017) 7 tablet 4  . ondansetron (ZOFRAN ODT) 4 MG disintegrating tablet Take 1 tablet (4 mg total) by mouth every 8 (eight) hours as needed for nausea or vomiting. (Patient not taking: Reported on 02/07/2017) 20 tablet 0  . polyethylene glycol powder (GLYCOLAX/MIRALAX) powder Take 17 g by mouth daily as needed for moderate constipation. (Patient not taking: Reported on 02/07/2017) 3350 g 1  . pramoxine-hydrocortisone (ANALPRAM-HC) 1-1 % rectal cream Place 1 application rectally 2 (two) times daily. (Patient not taking: Reported on 04/02/2017) 30 g 1  . promethazine (PHENERGAN) 25 MG tablet Take 1 tablet (25 mg total) by mouth every 6 (six) hours as needed for nausea. 30 tablet 0  . triamcinolone cream (KENALOG) 0.1 % Apply 1 application topically 2 (two) times daily. (Patient not taking: Reported on 03/19/2017) 30 g 0  . TRUEPLUS LANCETS 28G MISC Use as directed (Patient not taking: Reported on 03/19/2017) 100 each 1   No facility-administered medications prior to visit.     ROS Review of Systems  Constitutional: Negative for activity change, appetite change and fatigue.  HENT: Negative for congestion, sinus pressure and sore throat.   Eyes: Negative for  visual disturbance.  Respiratory: Negative for cough, chest tightness, shortness of breath and wheezing.   Cardiovascular: Negative for chest pain and palpitations.  Gastrointestinal: Negative for abdominal distention, abdominal pain and constipation.  Endocrine: Negative for polydipsia.  Genitourinary: Positive for menstrual problem. Negative for dysuria and frequency.  Musculoskeletal: Negative for arthralgias and back pain.    Skin: Negative for rash.  Neurological: Negative for tremors, light-headedness and numbness.  Hematological: Does not bruise/bleed easily.  Psychiatric/Behavioral: Negative for agitation and behavioral problems.    Objective:  BP 122/82   Pulse 68   Temp 98.3 F (36.8 C) (Oral)   Ht _0  (1.676 m)   Wt 251 lb (113.9 kg)   SpO2 100%   BMI 40.51 kg/m   BP/Weight 04/02/2017 03/19/2017 44/96/7591  Systolic BP 638 466 599  Diastolic BP 82 82 78  Wt. (Lbs) 251 256.6 260.2  BMI 40.51 41.42 42      Physical Exam  Constitutional: She is oriented to person, place, and time. She appears well-developed and well-nourished.  Cardiovascular: Normal rate, normal heart sounds and intact distal pulses.  No murmur heard. Pulmonary/Chest: Effort normal and breath sounds normal. She has no wheezes. She has no rales. She exhibits no tenderness.  Abdominal: Soft. Bowel sounds are normal. She exhibits no distension and no mass. There is no tenderness.  Musculoskeletal: Normal range of motion.  Neurological: She is alert and oriented to person, place, and time.  Psychiatric: She has a normal mood and affect.     Lab Results  Component Value Date   HGBA1C 7.3 03/19/2017    Assessment & Plan:   1. New onset type 2 diabetes mellitus ([place]) Controlled with A1c of 7.3 She developed a rash with metformin In the light of desire for weight loss and was placed on Victoza We have discussed hypoglycemia management and she knows to notify the clinic in the event that this occurs Counseled on Diabetic diet, my plate method, 357 minutes of moderate intensity exercise/week Keep blood sugar logs with fasting goals of 80-120 mg/dl, random of less than 180 and in the event of sugars less than 60 mg/dl or greater than 400 mg/dl please notify the clinic ASAP. It is recommended that you undergo annual eye exams and annual foot exams. Pneumonia vaccine is recommended. - POCT glucose (manual entry) -  liraglutide (VICTOZA) 18 MG/3ML SOPN; Inject 0.1 mLs (0.6 mg total) into the skin daily with breakfast.  Dispense: 3 mL; Refill: 3 - Insulin Pen Needle 31G X 5 MM MISC; 1 each by Does not apply route at bedtime.  Dispense: 30 each; Refill: 5  2. Irregular menses Previously prescribed Provera by her prior PCP which she declined taking - US PELVIC COMPLETE WITH TRANSVAGINAL; Future  3. Bipolar depression ([place]) Declines mental care or pharmacotherapy States she receives counseling from her dad  4. Class 3 severe obesity due to excess calories without serious comorbidity with body mass index (BMI) of 40.0 to 44.9 in adult [facility]) Discussed reducing portion sizes, avoiding late meals, exercising 150 minutes/week Hopefully addition of Victoza will help with weight loss   Meds ordered this encounter  Medications  . liraglutide (VICTOZA) 18 MG/3ML SOPN    Sig: Inject 0.1 mLs (0.6 mg total) into the skin daily with breakfast.    Dispense:  3 mL    Refill:  3  . Insulin Pen Needle 31G X 5 MM MISC    Sig: 1 each by Does not apply route at bedtime.  Dispense:  30 each    Refill:  5    Follow-up: Return in about 6 weeks (around 05/14/2017) for Follow-up on Diabetes and irregular periods.   Charlott Rakes MD

## 2017-04-02 NOTE — Patient Instructions (Signed)

## 2017-04-03 ENCOUNTER — Other Ambulatory Visit: Payer: Self-pay | Admitting: Internal Medicine

## 2017-04-03 DIAGNOSIS — E119 Type 2 diabetes mellitus without complications: Secondary | ICD-10-CM

## 2017-04-03 MED FILL — **VICTOZA 18 MG/3 ML INJECT: 18 | 30 days supply | Qty: 3 | Fill #0

## 2017-04-05 ENCOUNTER — Ambulatory Visit (HOSPITAL_COMMUNITY)
Admission: RE | Admit: 2017-04-05 | Discharge: 2017-04-05 | Disposition: A | Discharge status: Home / Self Care | Payer: Self-pay | Source: Ambulatory Visit | Attending: Family Medicine | Admitting: Family Medicine

## 2017-04-05 DIAGNOSIS — N926 Irregular menstruation, unspecified: Secondary | ICD-10-CM | POA: Insufficient documentation

## 2017-04-05 MED FILL — TRUE METRIX GLUCOSE TEST ST: 30 days supply | Qty: 100 | Fill #1

## 2017-04-11 ENCOUNTER — Telehealth: Payer: Self-pay

## 2017-04-11 NOTE — Telephone Encounter (Signed)
Patient was called and informed of lab results. 

## 2017-04-16 ENCOUNTER — Ambulatory Visit: Payer: Self-pay | Admitting: Family Medicine

## 2017-05-07 ENCOUNTER — Ambulatory Visit: Payer: Self-pay | Attending: Family Medicine | Admitting: Family Medicine

## 2017-05-07 ENCOUNTER — Encounter: Payer: Self-pay | Admitting: Family Medicine

## 2017-05-07 VITALS — BP 139/75 | HR 78 | Temp 98.3°F | Ht 66.0 in | Wt 258.0 lb

## 2017-05-07 DIAGNOSIS — E119 Type 2 diabetes mellitus without complications: Secondary | ICD-10-CM | POA: Insufficient documentation

## 2017-05-07 DIAGNOSIS — Z794 Long term (current) use of insulin: Secondary | ICD-10-CM | POA: Insufficient documentation

## 2017-05-07 DIAGNOSIS — Z882 Allergy status to sulfonamides status: Secondary | ICD-10-CM | POA: Insufficient documentation

## 2017-05-07 DIAGNOSIS — N920 Excessive and frequent menstruation with regular cycle: Secondary | ICD-10-CM | POA: Insufficient documentation

## 2017-05-07 DIAGNOSIS — Z79899 Other long term (current) drug therapy: Secondary | ICD-10-CM | POA: Insufficient documentation

## 2017-05-07 DIAGNOSIS — E669 Obesity, unspecified: Secondary | ICD-10-CM | POA: Insufficient documentation

## 2017-05-07 DIAGNOSIS — F319 Bipolar disorder, unspecified: Secondary | ICD-10-CM | POA: Insufficient documentation

## 2017-05-07 DIAGNOSIS — N926 Irregular menstruation, unspecified: Secondary | ICD-10-CM

## 2017-05-07 LAB — GLUCOSE, POCT (MANUAL RESULT ENTRY): POC Glucose: 163 mg/dl — AB (ref 70–99)

## 2017-05-07 LAB — POCT URINE PREGNANCY: Preg Test, Ur: NEGATIVE

## 2017-05-07 MED ORDER — MEDROXYPROGESTERONE ACETATE 10 MG PO TABS: ORAL_TABLET | ORAL | 1 refills | Status: DC

## 2017-05-07 MED FILL — **VICTOZA 18 MG/3 ML INJECT: 18 | 30 days supply | Qty: 3 | Fill #1

## 2017-05-07 NOTE — Progress Notes (Signed)
Patient would like to discuss birth control.

## 2017-05-07 NOTE — Patient Instructions (Signed)

## 2017-05-07 NOTE — Progress Notes (Signed)
Subjective:  [name], female    DOB: Mar 26, 1980  Age: 37 y.o. MRN: [id number]  CC: Diabetes   HPI Colleen Downs is a 37 year old female with a history of newly diagnosed type 2 diabetes mellitus (A1c 7.3), obesity, bipolar disorder, abnormal uterine bleed who presents today for follow-up visit. She had an ultrasound from 02/2017 which was negative for fibroids with no explainable cause of menorrhagia. She informs me that periods are irregular occurring anywhere from 1-3 months intervals with resulting menorrhagia whenever she has them; LMP was 03/12/2017 and she is requesting to be placed back on Provera which she was previously prescribed by her last PCP which she would take on the first 7 days of the month and would regularize her periods. She recently had unprotected sexual intercourse and is unsure if she is pregnant; urine pregnancy test in the clinic is negative. She had informed the nurse she needed a form of contraception however on discussing the contraceptive methods available which will also help regularize her.  She declines any insistent Provera.  Past Medical History:  Diagnosis Date  . Diarrhea   . Irregular periods     Past Surgical History:  Procedure Laterality Date  . CHOLECYSTECTOMY      Allergies  Allergen Reactions  . Sulfa Drugs Cross Reactors Anaphylaxis     Outpatient Medications Prior to Visit  Medication Sig Dispense Refill  . Blood Glucose Monitoring Suppl (TRUE METRIX METER) w/Device KIT Use as directed 1 kit 0  . Insulin Pen Needle 31G X 5 MM MISC 1 each by Does not apply route at bedtime. 30 each 5  . liraglutide (VICTOZA) 18 MG/3ML SOPN Inject 0.1 mLs (0.6 mg total) into the skin daily with breakfast. 3 mL 3  . dicyclomine (BENTYL) 10 MG capsule Take 1 capsule (10 mg total) by mouth 3 (three) times daily before meals. (Patient not taking: Reported on 04/02/2017) 90 capsule 1  . glucose blood (TRUE METRIX BLOOD GLUCOSE TEST) test strip  Use as instructed (Patient not taking: Reported on 03/19/2017) 100 each 12  . loperamide (IMODIUM A-D) 2 MG tablet Take 1 tablet (2 mg total) by mouth 4 (four) times daily as needed for diarrhea or loose stools. (Patient not taking: Reported on 02/07/2017) 30 tablet 0  . ondansetron (ZOFRAN ODT) 4 MG disintegrating tablet Take 1 tablet (4 mg total) by mouth every 8 (eight) hours as needed for nausea or vomiting. (Patient not taking: Reported on 02/07/2017) 20 tablet 0  . polyethylene glycol powder (GLYCOLAX/MIRALAX) powder Take 17 g by mouth daily as needed for moderate constipation. (Patient not taking: Reported on 02/07/2017) 3350 g 1  . pramoxine-hydrocortisone (ANALPRAM-HC) 1-1 % rectal cream Place 1 application rectally 2 (two) times daily. (Patient not taking: Reported on 04/02/2017) 30 g 1  . promethazine (PHENERGAN) 25 MG tablet Take 1 tablet (25 mg total) by mouth every 6 (six) hours as needed for nausea. 30 tablet 0  . triamcinolone cream (KENALOG) 0.1 % Apply 1 application topically 2 (two) times daily. (Patient not taking: Reported on 03/19/2017) 30 g 0  . TRUEPLUS LANCETS 28G MISC Use as directed (Patient not taking: Reported on 03/19/2017) 100 each 1  . medroxyPROGESTERone (PROVERA) 10 MG tablet Take 1 tablet daily by mouth x 7 days at the beginning of each month (Patient not taking: Reported on 04/02/2017) 7 tablet 4   No facility-administered medications prior to visit.     ROS Review of Systems  Constitutional: Negative for activity  change, appetite change and fatigue.  HENT: Negative for congestion, sinus pressure and sore throat.   Eyes: Negative for visual disturbance.  Respiratory: Negative for cough, chest tightness, shortness of breath and wheezing.   Cardiovascular: Negative for chest pain and palpitations.  Gastrointestinal: Negative for abdominal distention, abdominal pain and constipation.  Endocrine: Negative for polydipsia.  Genitourinary: Negative for dysuria and  frequency.  Musculoskeletal: Negative for arthralgias and back pain.  Skin: Negative for rash.  Neurological: Negative for tremors, light-headedness and numbness.  Hematological: Does not bruise/bleed easily.  Psychiatric/Behavioral: Negative for agitation and behavioral problems.    Objective:  BP 139/75   Pulse 78   Temp 98.3 F (36.8 C) (Oral)   Ht '5\' 6"'$  (1.676 m)   Wt 258 lb (117 kg)   SpO2 100%   BMI 41.64 kg/m   BP/Weight 05/07/2017 04/02/2017 2/54/2706  Systolic BP 237 628 315  Diastolic BP 75 82 82  Wt. (Lbs) 258 251 256.6  BMI 41.64 40.51 41.42      Physical Exam  Constitutional: She is oriented to person, place, and time. She appears well-developed and well-nourished.  Cardiovascular: Normal rate, normal heart sounds and intact distal pulses.  No murmur heard. Pulmonary/Chest: Effort normal and breath sounds normal. She has no wheezes. She has no rales. She exhibits no tenderness.  Abdominal: Soft. Bowel sounds are normal. She exhibits no distension and no mass. There is no tenderness.  Musculoskeletal: Normal range of motion.  Neurological: She is alert and oriented to person, place, and time.  Skin: Skin is warm and dry.  Psychiatric: She has a normal mood and affect.     CLINICAL DATA:  Irregular menses. Menorrhagia and dysmenorrhea. Evaluate for fibroids.  EXAM: TRANSABDOMINAL AND TRANSVAGINAL ULTRASOUND OF PELVIS  TECHNIQUE: Both transabdominal and transvaginal ultrasound examinations of the pelvis were performed. Transabdominal technique was performed for global imaging of the pelvis including uterus, ovaries, adnexal regions, and pelvic cul-de-sac. It was necessary to proceed with endovaginal exam following the transabdominal exam to visualize the endometrium and ovaries.  COMPARISON:  None  FINDINGS: Uterus  Measurements: 7.3 x 3.7 x 4.5 cm. Mildly heterogeneous echotexture with no focal mass.  Endometrium  Thickness: 5.4 mm.  No  focal abnormality visualized.  Right ovary  Measurements: 4.2 x 2.3 x 2.5 cm. Normal appearance/no adnexal mass.  Left ovary  Measurements: 3.9 x 3.1 x 2.7 cm. Normal appearance/no adnexal mass.  Other findings  No abnormal free fluid.  IMPRESSION: No fibroids identified.  No cause for menorrhagia noted.   Electronically Signed   By: Dorise Bullion III M.D   On: 04/05/2017 15:57   Lab Results  Component Value Date   HGBA1C 7.3 03/19/2017    Assessment & Plan:   1. New onset type 2 diabetes mellitus ([place]) Controlled with A1c of 7.3 Currently on Victoza Counseled on Diabetic diet, my plate method, 176 minutes of moderate intensity exercise/week Keep blood sugar logs with fasting goals of 80-120 mg/dl, random of less than 180 and in the event of sugars less than 60 mg/dl or greater than 400 mg/dl please notify the clinic ASAP. It is recommended that you undergo annual eye exams and annual foot exams. Pneumonia vaccine is recommended. - POCT glucose (manual entry)  2. Irregular menses Pelvic ultrasound unrevealing We have discussed the option of oral contraceptive pills for regularization of her menstrual periods which she declines but is requesting prescription of Provera which worked in the past.  I have explained to  her that this does not confer any contraceptive benefit which she understands. She might benefit from Mirena, will refer to GYN. - TSH - medroxyPROGESTERone (PROVERA) 10 MG tablet; Take 1 tablet daily by mouth x 7 days at the beginning of each month  Dispense: 7 tablet; Refill: 1 - Ambulatory referral to Gynecology   Meds ordered this encounter  Medications  . medroxyPROGESTERone (PROVERA) 10 MG tablet    Sig: Take 1 tablet daily by mouth x 7 days at the beginning of each month    Dispense:  7 tablet    Refill:  1    Follow-up: Return in about 3 months (around 08/07/2017) for Follow-up of diabetes mellitus.   Charlott Rakes MD

## 2017-05-08 LAB — TSH: TSH: 0.795 u[IU]/mL (ref 0.450–4.500)

## 2017-05-09 ENCOUNTER — Telehealth: Payer: Self-pay

## 2017-05-09 NOTE — Telephone Encounter (Signed)
Patient was called and informed of lab results. 

## 2017-05-16 ENCOUNTER — Encounter: Payer: Self-pay | Admitting: Obstetrics & Gynecology

## 2017-05-16 MED FILL — MEDROXYPROGESTERONE 10 MG T: 10 | 30 days supply | Qty: 7 | Fill #0

## 2017-05-21 ENCOUNTER — Ambulatory Visit: Payer: Self-pay | Attending: Family Medicine

## 2017-06-17 MED FILL — **VICTOZA 18 MG/3 ML INJECT: 18 | 30 days supply | Qty: 3 | Fill #2

## 2017-06-17 MED FILL — MEDROXYPROGESTERONE 10 MG T: 10 | 30 days supply | Qty: 7 | Fill #1

## 2017-06-27 ENCOUNTER — Encounter: Payer: Self-pay | Admitting: Obstetrics & Gynecology

## 2017-07-02 ENCOUNTER — Encounter: Payer: Self-pay | Admitting: General Practice

## 2017-07-25 ENCOUNTER — Other Ambulatory Visit: Payer: Self-pay | Admitting: Family Medicine

## 2017-07-25 DIAGNOSIS — N926 Irregular menstruation, unspecified: Secondary | ICD-10-CM

## 2017-07-25 MED FILL — **VICTOZA 18 MG/3 ML INJECT: 18 | 28 days supply | Qty: 3 | Fill #3

## 2017-07-26 MED FILL — MEDROXYPROGESTERONE 10 MG T: 10 | 28 days supply | Qty: 7 | Fill #0

## 2017-07-29 ENCOUNTER — Encounter: Payer: Self-pay | Admitting: Family Medicine

## 2017-07-29 ENCOUNTER — Ambulatory Visit: Payer: Self-pay | Attending: Family Medicine | Admitting: Family Medicine

## 2017-07-29 VITALS — BP 120/78 | HR 73 | Temp 98.0°F | Ht 66.0 in | Wt 258.8 lb

## 2017-07-29 DIAGNOSIS — L0292 Furuncle, unspecified: Secondary | ICD-10-CM

## 2017-07-29 DIAGNOSIS — K582 Mixed irritable bowel syndrome: Secondary | ICD-10-CM

## 2017-07-29 DIAGNOSIS — N939 Abnormal uterine and vaginal bleeding, unspecified: Secondary | ICD-10-CM

## 2017-07-29 DIAGNOSIS — Z9119 Patient's noncompliance with other medical treatment and regimen: Secondary | ICD-10-CM | POA: Insufficient documentation

## 2017-07-29 DIAGNOSIS — Z794 Long term (current) use of insulin: Secondary | ICD-10-CM | POA: Insufficient documentation

## 2017-07-29 DIAGNOSIS — Z6841 Body Mass Index (BMI) 40.0 and over, adult: Secondary | ICD-10-CM | POA: Insufficient documentation

## 2017-07-29 DIAGNOSIS — Z79899 Other long term (current) drug therapy: Secondary | ICD-10-CM | POA: Insufficient documentation

## 2017-07-29 DIAGNOSIS — E119 Type 2 diabetes mellitus without complications: Secondary | ICD-10-CM

## 2017-07-29 DIAGNOSIS — L0232 Furuncle of buttock: Secondary | ICD-10-CM | POA: Insufficient documentation

## 2017-07-29 DIAGNOSIS — Z882 Allergy status to sulfonamides status: Secondary | ICD-10-CM | POA: Insufficient documentation

## 2017-07-29 DIAGNOSIS — E669 Obesity, unspecified: Secondary | ICD-10-CM | POA: Insufficient documentation

## 2017-07-29 DIAGNOSIS — Z9049 Acquired absence of other specified parts of digestive tract: Secondary | ICD-10-CM | POA: Insufficient documentation

## 2017-07-29 DIAGNOSIS — F319 Bipolar disorder, unspecified: Secondary | ICD-10-CM

## 2017-07-29 DIAGNOSIS — F3130 Bipolar disorder, current episode depressed, mild or moderate severity, unspecified: Secondary | ICD-10-CM | POA: Insufficient documentation

## 2017-07-29 LAB — GLUCOSE, POCT (MANUAL RESULT ENTRY): POC Glucose: 86 mg/dl (ref 70–99)

## 2017-07-29 MED ORDER — DICYCLOMINE HCL 10 MG PO CAPS: 10.0000 mg | ORAL_CAPSULE | Freq: Three times a day (TID) | ORAL | 1 refills | Status: DC

## 2017-07-29 MED ORDER — CEPHALEXIN 500 MG PO CAPS: 500.0000 mg | ORAL_CAPSULE | Freq: Two times a day (BID) | ORAL | 0 refills | Status: DC

## 2017-07-29 MED ORDER — LIRAGLUTIDE 18 MG/3ML ~~LOC~~ SOPN: 0.6000 mg | PEN_INJECTOR | Freq: Every day | SUBCUTANEOUS | 6 refills | Status: DC

## 2017-07-29 MED FILL — CEPHALEXIN 500 MG CAPSULE: 500 | 10 days supply | Qty: 20 | Fill #0

## 2017-07-29 MED FILL — DICYCLOMINE 10 MG CAPSULE: 10 | 30 days supply | Qty: 90 | Fill #0

## 2017-07-29 NOTE — Patient Instructions (Signed)
Irritable Bowel Syndrome, Adult Irritable bowel syndrome (IBS) is not one specific disease. It is a group of symptoms that affects the organs responsible for digestion (gastrointestinal or GI tract). To regulate how your GI tract works, your body sends signals back and forth between your intestines and your brain. If you have IBS, there may be a problem with these signals. As a result, your GI tract does not function normally. Your intestines may become more sensitive and overreact to certain things. This is especially true when you eat certain foods or when you are under stress. There are four types of IBS. These may be determined based on the consistency of your stool:  IBS with diarrhea.  IBS with constipation.  Mixed IBS.  Unsubtyped IBS.  It is important to know which type of IBS you have. Some treatments are more likely to be helpful for certain types of IBS. What are the causes? The exact cause of IBS is not known. What increases the risk? You may have a higher risk of IBS if:  You are a woman.  You are younger than 37 years old.  You have a family history of IBS.  You have mental health problems.  You have had bacterial infection of your GI tract.  What are the signs or symptoms? Symptoms of IBS vary from person to person. The main symptom is abdominal pain or discomfort. Additional symptoms usually include one or more of the following:  Diarrhea, constipation, or both.  Abdominal swelling or bloating.  Feeling full or sick after eating a small or regular-size meal.  Frequent gas.  Mucus in the stool.  A feeling of having more stool left after a bowel movement.  Symptoms tend to come and go. They may be associated with stress, psychiatric conditions, or nothing at all. How is this diagnosed? There is no specific test to diagnose IBS. Your health care provider will make a diagnosis based on a physical exam, medical history, and your symptoms. You may have other  tests to rule out other conditions that may be causing your symptoms. These may include:  Blood tests.  X-rays.  CT scan.  Endoscopy and colonoscopy. This is a test in which your GI tract is viewed with a long, thin, flexible tube.  How is this treated? There is no cure for IBS, but treatment can help relieve symptoms. IBS treatment often includes:  Changes to your diet, such as: ? Eating more fiber. ? Avoiding foods that cause symptoms. ? Drinking more water. ? Eating regular, medium-sized portioned meals.  Medicines. These may include: ? Fiber supplements if you have constipation. ? Medicine to control diarrhea (antidiarrheal medicines). ? Medicine to help control muscle spasms in your GI tract (antispasmodic medicines). ? Medicines to help with any mental health issues, such as antidepressants or tranquilizers.  Therapy. ? Talk therapy may help with anxiety, depression, or other mental health issues that can make IBS symptoms worse.  Stress reduction. ? Managing your stress can help keep symptoms under control.  Follow these instructions at home:  Take medicines only as directed by your health care provider.  Eat a healthy diet. ? Avoid foods and drinks with added sugar. ? Include more whole grains, fruits, and vegetables gradually into your diet. This may be especially helpful if you have IBS with constipation. ? Avoid any foods and drinks that make your symptoms worse. These may include dairy products and caffeinated or carbonated drinks. ? Do not eat large meals. ? Drink enough   fluid to keep your urine clear or pale yellow.  Exercise regularly. Ask your health care provider for recommendations of good activities for you.  Keep all follow-up visits as directed by your health care provider. This is important. Contact a health care provider if:  You have constant pain.  You have trouble or pain with swallowing.  You have worsening diarrhea. Get help right away  if:  You have severe and worsening abdominal pain.  You have diarrhea and: ? You have a rash, stiff neck, or severe headache. ? You are irritable, sleepy, or difficult to awaken. ? You are weak, dizzy, or extremely thirsty.  You have bright red blood in your stool or you have black tarry stools.  You have unusual abdominal swelling that is painful.  You vomit continuously.  You vomit blood (hematemesis).  You have both abdominal pain and a fever. This information is not intended to replace advice given to you by your health care provider. Make sure you discuss any questions you have with your health care provider. Document Released: 02/12/2005 Document Revised: 07/15/2015 Document Reviewed: 10/30/2013 Elsevier Interactive Patient Education  2018 Elsevier Inc.  

## 2017-07-29 NOTE — Progress Notes (Signed)
Patient has boil on buttock.

## 2017-07-29 NOTE — Progress Notes (Signed)
Subjective:  [name], female    DOB: October 22, 1980  Age: 37 y.o. MRN: [id number]  CC: Irritable Bowel Syndrome   HPI Colleen Downs is a 37 year old female with a history of type 2 diabetes mellitus (A1c 7.3), obesity, bipolar disorder, abnormal uterine bleed who presents today for follow-up visit. She remains on Victoza for management of her diabetes mellitus and denies hypoglycemia, visual concerns or neuropathy. For her abnormal uterine bleed she remains on Provera; I had referred her to GYN however she no showed for her appointment.  With regards to her bipolar disorder she informs me she is stable and denies depression, suicidal ideation and had that is her major source of therapy as she talks to him a lot.  She feels she is okay and does not need medications or the services of a mental health professional. She complains of worsening irritable bowel syndrome with diarrhea occurring about 3-6 times a day with associated abdominal cramping. She works at [employer] and brings in a form which requires completion indicating she has no medical conditions that poses a risk at her place of work -form completed and handed over to the patient.  Past Medical History:  Diagnosis Date  . Diarrhea   . Irregular periods     Past Surgical History:  Procedure Laterality Date  . CHOLECYSTECTOMY      Allergies  Allergen Reactions  . Sulfa Drugs Cross Reactors Anaphylaxis     Outpatient Medications Prior to Visit  Medication Sig Dispense Refill  . Blood Glucose Monitoring Suppl (TRUE METRIX METER) w/Device KIT Use as directed 1 kit 0  . Insulin Pen Needle 31G X 5 MM MISC 1 each by Does not apply route at bedtime. 30 each 5  . medroxyPROGESTERone (PROVERA) 10 MG tablet TAKE 1 TABLET BY MOUTH DAILY FOR 7 DAYS AT THE BEGINNING OF EACH MONTH 7 tablet 1  . liraglutide (VICTOZA) 18 MG/3ML SOPN Inject 0.1 mLs (0.6 mg total) into the skin daily with breakfast. 3 mL 3  . glucose  blood (TRUE METRIX BLOOD GLUCOSE TEST) test strip Use as instructed (Patient not taking: Reported on 03/19/2017) 100 each 12  . loperamide (IMODIUM A-D) 2 MG tablet Take 1 tablet (2 mg total) by mouth 4 (four) times daily as needed for diarrhea or loose stools. (Patient not taking: Reported on 02/07/2017) 30 tablet 0  . ondansetron (ZOFRAN ODT) 4 MG disintegrating tablet Take 1 tablet (4 mg total) by mouth every 8 (eight) hours as needed for nausea or vomiting. (Patient not taking: Reported on 02/07/2017) 20 tablet 0  . polyethylene glycol powder (GLYCOLAX/MIRALAX) powder Take 17 g by mouth daily as needed for moderate constipation. (Patient not taking: Reported on 02/07/2017) 3350 g 1  . pramoxine-hydrocortisone (ANALPRAM-HC) 1-1 % rectal cream Place 1 application rectally 2 (two) times daily. (Patient not taking: Reported on 04/02/2017) 30 g 1  . promethazine (PHENERGAN) 25 MG tablet Take 1 tablet (25 mg total) by mouth every 6 (six) hours as needed for nausea. 30 tablet 0  . triamcinolone cream (KENALOG) 0.1 % Apply 1 application topically 2 (two) times daily. (Patient not taking: Reported on 03/19/2017) 30 g 0  . TRUEPLUS LANCETS 28G MISC Use as directed (Patient not taking: Reported on 03/19/2017) 100 each 1  . dicyclomine (BENTYL) 10 MG capsule Take 1 capsule (10 mg total) by mouth 3 (three) times daily before meals. (Patient not taking: Reported on 04/02/2017) 90 capsule 1   No facility-administered medications prior  to visit.     ROS Review of Systems  Constitutional: Negative for activity change, appetite change and fatigue.  HENT: Negative for congestion, sinus pressure and sore throat.   Eyes: Negative for visual disturbance.  Respiratory: Negative for cough, chest tightness, shortness of breath and wheezing.   Cardiovascular: Negative for chest pain and palpitations.  Gastrointestinal: Negative for abdominal distention, abdominal pain and constipation.  Endocrine: Negative for polydipsia.    Genitourinary: Negative for dysuria and frequency.  Musculoskeletal: Negative for arthralgias and back pain.  Skin: Positive for rash.  Neurological: Negative for tremors, light-headedness and numbness.  Hematological: Does not bruise/bleed easily.  Psychiatric/Behavioral: Negative for agitation and behavioral problems.    Objective:  BP 120/78   Pulse 73   Temp 98 F (36.7 C) (Oral)   Ht '5\' 6"'$  (1.676 m)   Wt 258 lb 12.8 oz (117.4 kg)   SpO2 99%   BMI 41.77 kg/m   BP/Weight 07/29/2017 08/17/2977 10/04/2117  Systolic BP 417 408 144  Diastolic BP 78 75 82  Wt. (Lbs) 258.8 258 251  BMI 41.77 41.64 40.51      Physical Exam  Constitutional: She is oriented to person, place, and time. She appears well-developed and well-nourished.  Cardiovascular: Normal rate, normal heart sounds and intact distal pulses.  No murmur heard. Pulmonary/Chest: Effort normal and breath sounds normal. She has no wheezes. She has no rales. She exhibits no tenderness.  Abdominal: Soft. Bowel sounds are normal. She exhibits no distension and no mass. There is no tenderness.  Musculoskeletal: Normal range of motion.  Neurological: She is alert and oriented to person, place, and time.  Skin: Skin is warm and dry.  Medial aspect of left buttock cheek with tender furuncle, no erythema, no punctum or discharge noted.  Psychiatric: She has a normal mood and affect.    CMP Latest Ref Rng & Units 12/03/2016 08/24/2016 07/30/2016  Glucose 65 - 99 mg/dL 131(H) 115(H) 149(H)  BUN 6 - 20 mg/dL '8 7 8  '$ Creatinine 0.44 - 1.00 mg/dL 0.65 0.75 0.91  Sodium 135 - 145 mmol/L 135 136 134(L)  Potassium 3.5 - 5.1 mmol/L 3.6 3.7 3.5  Chloride 101 - 111 mmol/L 105 104 102  CO2 22 - 32 mmol/L '24 24 24  '$ Calcium 8.9 - 10.3 mg/dL 8.9 9.0 8.6(L)  Total Protein 6.5 - 8.1 g/dL 7.6 7.2 7.4  Total Bilirubin 0.3 - 1.2 mg/dL 0.3 0.5 0.5  Alkaline Phos 38 - 126 U/L 35(L) 30(L) 30(L)  AST 15 - 41 U/L '26 22 27  '$ ALT 14 - 54 U/L '26 20 20     '$ Lab Results  Component Value Date   HGBA1C 7.3 03/19/2017      Assessment & Plan:   1. Type 2 diabetes mellitus without complication, without long-term current use of insulin ([facility]) Controlled with A1c of 7.3 Diabetic diet - POCT glucose (manual entry) - liraglutide (VICTOZA) 18 MG/3ML SOPN; Inject 0.1 mLs (0.6 mg total) into the skin daily with breakfast.  Dispense: 3 mL; Refill: 6 - CMP14+EGFR - Microalbumin/Creatinine Ratio, Urine  2. Irritable bowel syndrome with both constipation and diarrhea Uncontrolled Dicyclomine appears on her med list however she has not been compliant with it I have refilled this. - dicyclomine (BENTYL) 10 MG capsule; Take 1 capsule (10 mg total) by mouth 3 (three) times daily before meals.  Dispense: 90 capsule; Refill: 1  3. Bipolar depression ([place]) Stable. She currently does not see mental health professional and is not  on medications. States her dad serves as her therapist and she talks things through with him  4. Abnormal uterine bleeding Currently on Provera Referred to GYN previously but she no showed  5. Furuncle Advised to perform sitz bath No indication for I&D at this time - cephALEXin (KEFLEX) 500 MG capsule; Take 1 capsule (500 mg total) by mouth 2 (two) times daily.  Dispense: 20 capsule; Refill: 0   Meds ordered this encounter  Medications  . liraglutide (VICTOZA) 18 MG/3ML SOPN    Sig: Inject 0.1 mLs (0.6 mg total) into the skin daily with breakfast.    Dispense:  3 mL    Refill:  6  . dicyclomine (BENTYL) 10 MG capsule    Sig: Take 1 capsule (10 mg total) by mouth 3 (three) times daily before meals.    Dispense:  90 capsule    Refill:  1  . cephALEXin (KEFLEX) 500 MG capsule    Sig: Take 1 capsule (500 mg total) by mouth 2 (two) times daily.    Dispense:  20 capsule    Refill:  0    Follow-up: Return in about 6 months (around 01/28/2018) for Management of chronic medical conditions.   Charlott Rakes MD

## 2017-07-30 LAB — CMP14+EGFR
A/G RATIO: 1.4 (ref 1.2–2.2)
ALK PHOS: 47 IU/L (ref 39–117)
ALT: 19 IU/L (ref 0–32)
AST: 15 IU/L (ref 0–40)
Albumin: 4.3 g/dL (ref 3.5–5.5)
BILIRUBIN TOTAL: 0.3 mg/dL (ref 0.0–1.2)
BUN/Creatinine Ratio: 9 (ref 9–23)
BUN: 7 mg/dL (ref 6–20)
CHLORIDE: 99 mmol/L (ref 96–106)
CO2: 21 mmol/L (ref 20–29)
Calcium: 9.3 mg/dL (ref 8.7–10.2)
Creatinine, Ser: 0.75 mg/dL (ref 0.57–1.00)
GFR calc non Af Amer: 103 mL/min/{1.73_m2} (ref >59)
GFR, EST AFRICAN AMERICAN: 119 mL/min/{1.73_m2} (ref >59)
GLUCOSE: 87 mg/dL (ref 65–99)
Globulin, Total: 3.1 g/dL (ref 1.5–4.5)
POTASSIUM: 4 mmol/L (ref 3.5–5.2)
Sodium: 137 mmol/L (ref 134–144)
TOTAL PROTEIN: 7.4 g/dL (ref 6.0–8.5)

## 2017-07-30 LAB — MICROALBUMIN / CREATININE URINE RATIO
CREATININE, UR: 261.8 mg/dL
MICROALBUM., U, RANDOM: 6.3 ug/mL
Microalb/Creat Ratio: 2.4 mg/g creat (ref 0.0–30.0)

## 2017-07-31 ENCOUNTER — Telehealth: Payer: Self-pay

## 2017-07-31 ENCOUNTER — Encounter (HOSPITAL_BASED_OUTPATIENT_CLINIC_OR_DEPARTMENT_OTHER): Payer: Self-pay

## 2017-07-31 ENCOUNTER — Emergency Department (HOSPITAL_BASED_OUTPATIENT_CLINIC_OR_DEPARTMENT_OTHER)
Admission: EM | Admit: 2017-07-31 | Discharge: 2017-07-31 | Disposition: A | Discharge status: Home / Self Care | Payer: Self-pay | Attending: Emergency Medicine | Admitting: Emergency Medicine

## 2017-07-31 DIAGNOSIS — Z9049 Acquired absence of other specified parts of digestive tract: Secondary | ICD-10-CM | POA: Insufficient documentation

## 2017-07-31 DIAGNOSIS — Z87891 Personal history of nicotine dependence: Secondary | ICD-10-CM | POA: Insufficient documentation

## 2017-07-31 DIAGNOSIS — L0231 Cutaneous abscess of buttock: Secondary | ICD-10-CM | POA: Insufficient documentation

## 2017-07-31 DIAGNOSIS — F319 Bipolar disorder, unspecified: Secondary | ICD-10-CM | POA: Insufficient documentation

## 2017-07-31 DIAGNOSIS — E119 Type 2 diabetes mellitus without complications: Secondary | ICD-10-CM | POA: Insufficient documentation

## 2017-07-31 DIAGNOSIS — Z794 Long term (current) use of insulin: Secondary | ICD-10-CM | POA: Insufficient documentation

## 2017-07-31 MED ORDER — LIDOCAINE-EPINEPHRINE (PF) 2 %-1:200000 IJ SOLN
10.0000 mL | Freq: Once | INTRAMUSCULAR | Status: AC
  Administered 2017-07-31: 10 mL via INTRADERMAL
  Filled 2017-07-31: qty 10

## 2017-07-31 MED ORDER — CLINDAMYCIN HCL 300 MG PO CAPS: 300.0000 mg | ORAL_CAPSULE | Freq: Three times a day (TID) | ORAL | 0 refills | Status: AC

## 2017-07-31 MED ORDER — LIDOCAINE HCL 1 % IJ SOLN
INTRAMUSCULAR | Status: AC
  Filled 2017-07-31: qty 20

## 2017-07-31 MED ORDER — LIDOCAINE-EPINEPHRINE (PF) 2 %-1:200000 IJ SOLN
20.0000 mL | Freq: Once | INTRAMUSCULAR | Status: DC
  Filled 2017-07-31: qty 20

## 2017-07-31 NOTE — ED Provider Notes (Signed)
[place] EMERGENCY DEPARTMENT Provider Note  CSN: [id number] Arrival date & time: 07/31/17  1023    History   Chief Complaint Chief Complaint  Patient presents with  . Recurrent Skin Infections    HPI Colleen Downs is a 37 y.o. female with a medical history of IBS and Type 2 DM who presented to the ED for buttock abscess. Patient is unsure how long it has been there, but has began notice pain back there for a couple of days. Endorses abdominal pain and diarrhea, but attributes that to her IBS. Denies fever, chills, skin rashes, arthralgias. Denies anal intercourse, trauma or injury to the area.   Past Medical History:  Diagnosis Date  . Diarrhea   . Irregular periods     Patient Active Problem List   Diagnosis Date Noted  . Abnormal uterine bleeding 07/29/2017  . Bipolar depression ([place]) 03/19/2017  . Irritable bowel syndrome with both constipation and diarrhea 12/25/2016  . New onset type 2 diabetes mellitus ([place]) 12/25/2016  . Class 3 severe obesity due to excess calories without serious comorbidity with body mass index (BMI) of 40.0 to 44.9 in adult ([place]) 12/25/2016  . Irregular menses 12/25/2016  . Immunization due 12/24/2016    Past Surgical History:  Procedure Laterality Date  . CHOLECYSTECTOMY       OB History   None      Home Medications    Prior to Admission medications   Medication Sig Start Date End Date Taking? Authorizing Provider  Blood Glucose Monitoring Suppl (TRUE METRIX METER) w/Device KIT Use as directed 04/03/17   Ladell Pier, MD  cephALEXin (KEFLEX) 500 MG capsule Take 1 capsule (500 mg total) by mouth 2 (two) times daily. 07/29/17   Charlott Rakes, MD  clindamycin (CLEOCIN) 300 MG capsule Take 1 capsule (300 mg total) by mouth 3 (three) times daily for 7 days. 07/31/17 08/07/17  Brianca Fortenberry, Alvie [place] I, PA-C  dicyclomine (BENTYL) 10 MG capsule Take 1 capsule (10 mg total) by mouth 3 (three) times daily before meals. 07/29/17   Charlott Rakes, MD  glucose blood (TRUE METRIX BLOOD GLUCOSE TEST) test strip Use as instructed Patient not taking: Reported on 03/19/2017 12/24/16   Ladell Pier, MD  Insulin Pen Needle 31G X 5 MM MISC 1 each by Does not apply route at bedtime. 04/02/17   Charlott Rakes, MD  liraglutide (VICTOZA) 18 MG/3ML SOPN Inject 0.1 mLs (0.6 mg total) into the skin daily with breakfast. 07/29/17   Charlott Rakes, MD  loperamide (IMODIUM A-D) 2 MG tablet Take 1 tablet (2 mg total) by mouth 4 (four) times daily as needed for diarrhea or loose stools. Patient not taking: Reported on 02/07/2017 12/24/16   Ladell Pier, MD  medroxyPROGESTERone (PROVERA) 10 MG tablet TAKE 1 TABLET BY MOUTH DAILY FOR 7 DAYS AT THE BEGINNING OF EACH MONTH 07/26/17   Charlott Rakes, MD  ondansetron (ZOFRAN ODT) 4 MG disintegrating tablet Take 1 tablet (4 mg total) by mouth every 8 (eight) hours as needed for nausea or vomiting. Patient not taking: Reported on 02/07/2017 12/03/16   Joy, Raquel Sarna C, PA-C  polyethylene glycol powder (GLYCOLAX/MIRALAX) powder Take 17 g by mouth daily as needed for moderate constipation. Patient not taking: Reported on 02/07/2017 12/24/16   Ladell Pier, MD  pramoxine-hydrocortisone [facility]) 1-1 % rectal cream Place 1 application rectally 2 (two) times daily. Patient not taking: Reported on 04/02/2017 03/19/17   Charlott Rakes, MD  promethazine (PHENERGAN) 25 MG tablet  Take 1 tablet (25 mg total) by mouth every 6 (six) hours as needed for nausea. 08/01/11 08/08/11  Hunt, Romelle Starcher, PA-C  triamcinolone cream (KENALOG) 0.1 % Apply 1 application topically 2 (two) times daily. Patient not taking: Reported on 03/19/2017 02/07/17   Ladell Pier, MD  TRUEPLUS LANCETS 28G MISC Use as directed Patient not taking: Reported on 03/19/2017 12/24/16   Ladell Pier, MD    Family History Family History  Problem Relation Age of Onset  . Rheum arthritis Mother   . Rheum arthritis Sister   . Arthritis  Maternal Uncle   . Arthritis Paternal Uncle   . Diabetes Maternal Grandmother     Social History Social History   Tobacco Use  . Smoking status: Former Research scientist (life sciences)  . Smokeless tobacco: Never Used  Substance Use Topics  . Alcohol use: Yes    Comment: occ  . Drug use: Yes    Types: Marijuana     Allergies   Sulfa drugs cross reactors   Review of Systems Review of Systems  Constitutional: Negative for chills, diaphoresis and fever.  Gastrointestinal: Positive for abdominal pain and diarrhea. Negative for anal bleeding, constipation, nausea and vomiting.  Genitourinary: Negative.  Negative for difficulty urinating, dysuria, pelvic pain and vaginal pain.  Musculoskeletal: Negative.   Skin: Positive for wound.  Allergic/Immunologic: Negative for immunocompromised state.  Hematological: Negative.      Physical Exam Updated Vital Signs BP 130/71 (BP Location: Right Arm)   Pulse 94   Temp 98.7 F (37.1 C) (Oral)   Resp 18   SpO2 100%   Physical Exam  Constitutional: She appears well-developed and well-nourished.  Obese.  Cardiovascular: Normal rate, regular rhythm, normal heart sounds and intact distal pulses.  Pulmonary/Chest: Effort normal and breath sounds normal.  Abdominal: Soft. Bowel sounds are normal. She exhibits no distension. There is no tenderness. There is no guarding.  Genitourinary: Rectum normal. Rectal exam shows no external hemorrhoid, no internal hemorrhoid, no mass, no tenderness and anal tone normal.     Genitourinary Comments: 1cm abscess on left buttock. Fluctuant, tender, non-erythematous. No surrounding erythema or rash. No involvement of anus or rectum.  Skin: Skin is warm, dry and intact. No rash noted. No erythema.  1 cm abscess on left buttock.  Nursing note and vitals reviewed.    ED Treatments / Results  Labs (all labs ordered are listed, but only abnormal results are displayed) Labs Reviewed - No data to  display  EKG None  Radiology No results found.  Procedures .Marland KitchenIncision and Drainage Date/Time: 07/31/2017 11:45 AM Performed by: Romie Jumper, PA-C Authorized by: Romie Jumper, PA-C   Consent:    Consent obtained:  Verbal   Consent given by:  Patient   Risks discussed:  Bleeding, infection, incomplete drainage and pain   Alternatives discussed:  No treatment Location:    Type:  Abscess   Size:  1.5cm   Location:  Lower extremity   Lower extremity location:  Buttock   Buttock location:  L buttock Pre-procedure details:    Skin preparation:  Betadine Anesthesia (see MAR for exact dosages):    Anesthesia method:  Local infiltration   Local anesthetic:  Lidocaine 2% WITH epi Procedure type:    Complexity:  Simple Procedure details:    Needle aspiration: no     Incision types:  Single straight   Incision depth:  Subcutaneous   Scalpel blade:  15   Wound management:  Probed and deloculated and irrigated  with saline   Drainage:  Purulent and bloody   Drainage amount:  Moderate   Wound treatment:  Wound left open   Packing materials:  None Post-procedure details:    Patient tolerance of procedure:  Tolerated well, no immediate complications   (including critical care time)  Medications Ordered in ED Medications  lidocaine (XYLOCAINE) 1 % (with pres) injection (has no administration in time range)  lidocaine-EPINEPHrine (XYLOCAINE W/EPI) 2 %-1:200000 (PF) injection 10 mL (10 mLs Intradermal Given 07/31/17 1112)    Initial Impression / Assessment and Plan / ED Course  Triage vital signs and the nursing notes have been reviewed.  Pertinent labs & imaging results that were available during care of the patient were reviewed and considered in medical decision making (see chart for details).   Patient presents in no acute distress. No systemic s/s to suggest a widespread infection or intra-abdominal process that needs to be evaluated. Simple left buttock abscess  visualized on exam without surrounding erythema or warmth to suggest cellulitis. Abscess was fluctuant and not near anorectal area. Normal rectal exam. I&D performed without complications. Education provided on appropriate cleaning and outpatient follow-up for wound checks.  Final Clinical Impressions(s) / ED Diagnoses  Buttock Abscess. I&D performed without complications. No packing. Prophylactic antibiotics prescribed. Clindamycin '300mg'$  TID x7 days.   Dispo: Home. After thorough clinical evaluation, this patient is determined to be medically stable and can be safely discharged with the previously mentioned treatment and/or outpatient follow-up/referral(s). At this time, there are no other apparent medical conditions that require further screening, evaluation or treatment.  Final diagnoses:  Abscess of left buttock    ED Discharge Orders        Ordered    clindamycin (CLEOCIN) 300 MG capsule  3 times daily     07/31/17 [address] [place] I, PA-C 07/31/17 1227    Quintella Reichert, MD 07/31/17 [phone]

## 2017-07-31 NOTE — ED Notes (Signed)
NAD at this time. Pt is stable and going home.  

## 2017-07-31 NOTE — Discharge Instructions (Signed)
Follow-up with your clinic or back here in 3-5 days for a wound check.  Stop taking the cephalexin (Keflex) that you were prescribed and take the new medication, clindamycin (Cleocin) for 7 days.  Reasons to return: fever, warmness in area, odor coming from wound, change in the drainage from the area.

## 2017-07-31 NOTE — ED Triage Notes (Signed)
Pt c/o boil to buttocks with pain to lower back and vagina

## 2017-07-31 NOTE — Telephone Encounter (Signed)
Patient was called and informed of lab results. 

## 2017-08-06 ENCOUNTER — Ambulatory Visit: Payer: Self-pay | Attending: Family Medicine | Admitting: *Deleted

## 2017-08-06 DIAGNOSIS — Z5189 Encounter for other specified aftercare: Secondary | ICD-10-CM

## 2017-08-06 DIAGNOSIS — L0231 Cutaneous abscess of buttock: Secondary | ICD-10-CM | POA: Insufficient documentation

## 2017-08-06 NOTE — Progress Notes (Signed)
Pt came to [facility] to f/u on abscess on left buttock. I&D performed on 07/31/2017 in the ED. Per note, wound was left open and no packing materials were placed. She was prescribed ATB and instructed to f/u with PCP.   She denies any drainage or odor from wound. She states she has been doing good perineal care. She admits to a little pain and hardness at the site which she has concerns about. Advised patient to see her PCP. She wishes not to see her today. She has to return to work. She is still taking antibiotic as prescribed.  Appointment was scheduled on walk-in schedule to follow up.

## 2017-08-07 ENCOUNTER — Ambulatory Visit: Payer: Self-pay | Admitting: Family Medicine

## 2017-08-07 ENCOUNTER — Encounter (HOSPITAL_BASED_OUTPATIENT_CLINIC_OR_DEPARTMENT_OTHER): Payer: Self-pay | Admitting: *Deleted

## 2017-08-07 ENCOUNTER — Emergency Department (HOSPITAL_BASED_OUTPATIENT_CLINIC_OR_DEPARTMENT_OTHER): Payer: Self-pay

## 2017-08-07 ENCOUNTER — Emergency Department (HOSPITAL_BASED_OUTPATIENT_CLINIC_OR_DEPARTMENT_OTHER)
Admission: EM | Admit: 2017-08-07 | Discharge: 2017-08-07 | Disposition: A | Discharge status: Home / Self Care | Payer: Self-pay | Attending: Emergency Medicine | Admitting: Emergency Medicine

## 2017-08-07 ENCOUNTER — Other Ambulatory Visit: Payer: Self-pay

## 2017-08-07 DIAGNOSIS — R42 Dizziness and giddiness: Secondary | ICD-10-CM | POA: Insufficient documentation

## 2017-08-07 DIAGNOSIS — G44209 Tension-type headache, unspecified, not intractable: Secondary | ICD-10-CM | POA: Insufficient documentation

## 2017-08-07 DIAGNOSIS — Z794 Long term (current) use of insulin: Secondary | ICD-10-CM | POA: Insufficient documentation

## 2017-08-07 DIAGNOSIS — E119 Type 2 diabetes mellitus without complications: Secondary | ICD-10-CM | POA: Insufficient documentation

## 2017-08-07 DIAGNOSIS — M25511 Pain in right shoulder: Secondary | ICD-10-CM | POA: Insufficient documentation

## 2017-08-07 LAB — COMPREHENSIVE METABOLIC PANEL
ALBUMIN: 3.7 g/dL (ref 3.5–5.0)
ALK PHOS: 33 U/L — AB (ref 38–126)
ALT: 26 U/L (ref 14–54)
ANION GAP: 8 (ref 5–15)
AST: 25 U/L (ref 15–41)
BILIRUBIN TOTAL: 0.7 mg/dL (ref 0.3–1.2)
BUN: 7 mg/dL (ref 6–20)
CALCIUM: 8.5 mg/dL — AB (ref 8.9–10.3)
CO2: 26 mmol/L (ref 22–32)
CREATININE: 0.92 mg/dL (ref 0.44–1.00)
Chloride: 101 mmol/L (ref 101–111)
GFR calc non Af Amer: >60 mL/min (ref >60)
GLUCOSE: 121 mg/dL — AB (ref 65–99)
Potassium: 3.7 mmol/L (ref 3.5–5.1)
SODIUM: 135 mmol/L (ref 135–145)
TOTAL PROTEIN: 7.8 g/dL (ref 6.5–8.1)

## 2017-08-07 LAB — CBC WITH DIFFERENTIAL/PLATELET
BASOS ABS: 0 10*3/uL (ref 0.0–0.1)
BASOS PCT: 0 %
EOS ABS: 0.1 10*3/uL (ref 0.0–0.7)
Eosinophils Relative: 1 %
HCT: 35.4 % — ABNORMAL LOW (ref 36.0–46.0)
Hemoglobin: 11.9 g/dL — ABNORMAL LOW (ref 12.0–15.0)
Lymphocytes Relative: 38 %
Lymphs Abs: 4 10*3/uL (ref 0.7–4.0)
MCH: 30.7 pg (ref 26.0–34.0)
MCHC: 33.6 g/dL (ref 30.0–36.0)
MCV: 91.5 fL (ref 78.0–100.0)
MONO ABS: 0.7 10*3/uL (ref 0.1–1.0)
MONOS PCT: 6 %
Neutro Abs: 5.8 10*3/uL (ref 1.7–7.7)
Neutrophils Relative %: 55 %
Platelets: 360 10*3/uL (ref 150–400)
RBC: 3.87 MIL/uL (ref 3.87–5.11)
RDW: 13.1 % (ref 11.5–15.5)
WBC: 10.5 10*3/uL (ref 4.0–10.5)

## 2017-08-07 LAB — PREGNANCY, URINE: PREG TEST UR: NEGATIVE

## 2017-08-07 LAB — URINALYSIS, ROUTINE W REFLEX MICROSCOPIC
BILIRUBIN URINE: NEGATIVE
GLUCOSE, UA: NEGATIVE mg/dL
HGB URINE DIPSTICK: NEGATIVE
KETONES UR: NEGATIVE mg/dL
Leukocytes, UA: NEGATIVE
Nitrite: NEGATIVE
PROTEIN: NEGATIVE mg/dL
Specific Gravity, Urine: 1.01 (ref 1.005–1.030)
pH: 7 (ref 5.0–8.0)

## 2017-08-07 LAB — CBG MONITORING, ED: GLUCOSE-CAPILLARY: 127 mg/dL — AB (ref 65–99)

## 2017-08-07 LAB — TROPONIN I

## 2017-08-07 MED ORDER — KETOROLAC TROMETHAMINE 30 MG/ML IJ SOLN
30.0000 mg | Freq: Once | INTRAMUSCULAR | Status: AC
  Administered 2017-08-07: 30 mg via INTRAVENOUS
  Filled 2017-08-07: qty 1

## 2017-08-07 MED ORDER — IBUPROFEN 800 MG PO TABS: 800.0000 mg | ORAL_TABLET | Freq: Three times a day (TID) | ORAL | 0 refills | Status: DC | PRN

## 2017-08-07 MED ORDER — METHOCARBAMOL 500 MG PO TABS: 500.0000 mg | ORAL_TABLET | Freq: Four times a day (QID) | ORAL | 0 refills | Status: DC | PRN

## 2017-08-07 NOTE — Discharge Instructions (Signed)

## 2017-08-07 NOTE — Progress Notes (Signed)
[name], female   DOB: September 07, 1980, 37 y.o.   MRN: [id number]     Urvi Imes, is a 37 y.o. female  GLO:[id number]  JOA:[id number]  DOB - October 29, 1980  Subjective:  Chief Complaint and HPI: Colleen Downs is a 37 y.o. female here today  for a follow up visit after being Seen in ED for abscess 07/31/2017: From ED note: Buttock Abscess. I&D performed without complications. No packing. Prophylactic antibiotics prescribed. Clindamycin '300mg'$  TID x7 days.   ED/Hospital notes reviewed and summarized above.  Doing well.  Has 2 more days of antibiotics.    Pain is much improved.  No f/c.     ROS:   Constitutional:  No f/c, No night sweats, No unexplained weight loss. EENT:  No vision changes, No blurry vision, No hearing changes. No mouth, throat, or ear problems.  Respiratory: No cough, No SOB Cardiac: No CP, no palpitations GI:  No abd pain, No N/V/D. GU: No Urinary s/sx Musculoskeletal: No joint pain Neuro: No headache, no dizziness, no motor weakness.  Skin: No rash Endocrine:  No polydipsia. No polyuria.  Psych: Denies SI/HI  No problems updated.  ALLERGIES: Allergies  Allergen Reactions  . Sulfa Drugs Cross Reactors Anaphylaxis    PAST MEDICAL HISTORY: Past Medical History:  Diagnosis Date  . Diarrhea   . Irregular periods     MEDICATIONS AT HOME: Prior to Admission medications   Medication Sig Start Date End Date Taking? Authorizing Provider  Blood Glucose Monitoring Suppl (TRUE METRIX METER) w/Device KIT Use as directed 04/03/17  Yes Ladell Pier, MD  cephALEXin (KEFLEX) 500 MG capsule Take 1 capsule (500 mg total) by mouth 2 (two) times daily. 07/29/17  Yes Charlott Rakes, MD  dicyclomine (BENTYL) 10 MG capsule Take 1 capsule (10 mg total) by mouth 3 (three) times daily before meals. 07/29/17  Yes Charlott Rakes, MD  glucose blood (TRUE METRIX BLOOD GLUCOSE TEST) test strip Use as instructed 12/24/16  Yes Ladell Pier, MD  ibuprofen  (ADVIL,MOTRIN) 800 MG tablet Take 1 tablet (800 mg total) by mouth every 8 (eight) hours as needed. 08/07/17  Yes Long, Wonda Olds, MD  Insulin Pen Needle 31G X 5 MM MISC 1 each by Does not apply route at bedtime. 04/02/17  Yes Charlott Rakes, MD  liraglutide (VICTOZA) 18 MG/3ML SOPN Inject 0.1 mLs (0.6 mg total) into the skin daily with breakfast. 08/08/17  Yes McClung, Angela M, PA-C  methocarbamol (ROBAXIN) 500 MG tablet Take 1 tablet (500 mg total) by mouth every 6 (six) hours as needed for muscle spasms. 08/07/17  Yes Long, Wonda Olds, MD  TRUEPLUS LANCETS 28G MISC Use as directed 12/24/16  Yes Ladell Pier, MD  fluconazole (DIFLUCAN) 150 MG tablet Take 1 tablet (150 mg total) by mouth once for 1 dose. 08/08/17 08/08/17  Argentina Donovan, PA-C  loperamide (IMODIUM A-D) 2 MG tablet Take 1 tablet (2 mg total) by mouth 4 (four) times daily as needed for diarrhea or loose stools. Patient not taking: Reported on 02/07/2017 12/24/16   Ladell Pier, MD  medroxyPROGESTERone (PROVERA) 10 MG tablet TAKE 1 TABLET BY MOUTH DAILY FOR 7 DAYS AT THE BEGINNING OF EACH MONTH 07/26/17   Charlott Rakes, MD  ondansetron (ZOFRAN ODT) 4 MG disintegrating tablet Take 1 tablet (4 mg total) by mouth every 8 (eight) hours as needed for nausea or vomiting. Patient not taking: Reported on 02/07/2017 12/03/16   Joy, Shawn C, PA-C  polyethylene glycol powder (GLYCOLAX/MIRALAX)  powder Take 17 g by mouth daily as needed for moderate constipation. Patient not taking: Reported on 02/07/2017 12/24/16   Ladell Pier, MD  pramoxine-hydrocortisone [facility]) 1-1 % rectal cream Place 1 application rectally 2 (two) times daily. Patient not taking: Reported on 04/02/2017 03/19/17   Charlott Rakes, MD  promethazine (PHENERGAN) 25 MG tablet Take 1 tablet (25 mg total) by mouth every 6 (six) hours as needed for nausea. 08/01/11 08/08/11  Hunt, Romelle Starcher, PA-C  triamcinolone cream (KENALOG) 0.1 % Apply 1 application topically 2 (two)  times daily. Patient not taking: Reported on 03/19/2017 02/07/17   Ladell Pier, MD     Objective:  EXAM:   Vitals:   08/08/17 1614  BP: 131/78  Pulse: 66  Resp: 18  Temp: 98.2 F (36.8 C)  TempSrc: Oral  SpO2: 100%  Weight: 258 lb (117 kg)  Height: '5\' 6"'$  (1.676 m)    General appearance : A&OX3. NAD. Non-toxic-appearing HEENT: Atraumatic and Normocephalic.  PERRLA. EOM intact.   Neck: supple, no JVD. No cervical lymphadenopathy. No thyromegaly Chest/Lungs:  Breathing-non-labored, Good air entry bilaterally, breath sounds normal without rales, rhonchi, or wheezing  CVS: S1 S2 regular, no murmurs, gallops, rubs  L side of anus there is a 1cm healing incision with no surrounding erythema/fluctuance or induration Extremities: Bilateral Lower Ext shows no edema, both legs are warm to touch with = pulse throughout Neurology:  CN II-XII grossly intact, Non focal.   Psych:  TP linear. J/I WNL. Normal speech. Appropriate eye contact and affect.  Skin:  No Rash  Data Review Lab Results  Component Value Date   HGBA1C 7.7 (A) 08/08/2017   HGBA1C 7.3 03/19/2017   HGBA1C 7.6 12/24/2016     Assessment & Plan   1. Wound check, abscess Improving.  Finish antibiotics.  Diflucan sent in case she develops yeast infection  2. Type 2 diabetes mellitus without complication, without long-term current use of insulin (HCC) Not at goal.  Exercise and proper diet discussed.  Eliminate sugar/white carbs.   - Glucose (CBG) - HgB A1c - liraglutide (VICTOZA) 18 MG/3ML SOPN; Inject 0.1 mLs (0.6 mg total) into the skin daily with breakfast.  Dispense: 3 mL; Refill: 6  3. Hospital discharge follow-up improving   Patient have been counseled extensively about nutrition and exercise  Return in about 3 months (around 11/08/2017) for Dr Margarita Rana DM.  The patient was given clear instructions to go to ER or return to medical center if symptoms don't improve, worsen or new problems develop. The  patient verbalized understanding. The patient was told to call to get lab results if they haven't heard anything in the next week.     Freeman Caldron, PA-C [facility] and [place], [place]   08/08/2017, 4:27 PM

## 2017-08-07 NOTE — ED Provider Notes (Addendum)
Emergency Department Provider Note   I have reviewed the triage vital signs and the nursing notes.   HISTORY  Chief Complaint Shoulder Pain   HPI Colleen Downs is a 37 y.o. female with PMH of AUB, DM, IBS, and Bipolar disorder sent to the emergency department for evaluation of multiple complaints including right shoulder pain, blurry vision, tingling in her tongue and lips, and lightheadedness. The patient reports running out on money to afford her insulin needles. She is established with a PCP, Dr. Margarita Rana, and is currently getting Victoza on sample. She has an appointment later this week. She reports 3 days of the above symptoms. She notes increased stress at home and her new job. She anticipates being able to afford her meds/needles once she gets her first paycheck. Her lightheadedness symptoms are intermittent. No vertigo. No syncope. No CP, SOB, or palpitations. The patient's right shoulder pain is a "soreness" which radiates from the right lateral neck to the top of the arm/shoulder. No numbness or weakness but notes some intermittent tingling. No injury to the shoulder or neck. The patient is also complaining of tingling in the upper and lower lips along with the tip of the tongue. No pain or swelling. Patient also notes some bilateral blurry vision with is near baseline but worsened since stopping her insulin, as mentioned above. No eye pain, injury, sparks, or sensation of curtain dropping.   Past Medical History:  Diagnosis Date  . Diarrhea   . Irregular periods     Patient Active Problem List   Diagnosis Date Noted  . Abnormal uterine bleeding 07/29/2017  . Bipolar depression ([place]) 03/19/2017  . Irritable bowel syndrome with both constipation and diarrhea 12/25/2016  . New onset type 2 diabetes mellitus ([place]) 12/25/2016  . Class 3 severe obesity due to excess calories without serious comorbidity with body mass index (BMI) of 40.0 to 44.9 in adult ([place]) 12/25/2016  . Irregular  menses 12/25/2016  . Immunization due 12/24/2016    Past Surgical History:  Procedure Laterality Date  . CHOLECYSTECTOMY      Current Outpatient Rx  . Order #: [id number] Class: Normal  . Order #: [id number] Class: Normal  . Order #: [id number] Class: Normal  . Order #: [id number] Class: Normal  . Order #: [id number] Class: Print  . Order #: [id number] Class: Normal  . Order #: [id number] Class: Normal  . Order #: [id number] Class: Normal  . Order #: [id number] Class: Normal  . Order #: [id number] Class: Print  . Order #: [id number] Class: Print  . Order #: [id number] Class: Normal  . Order #: [id number] Class: Normal  . Order #: [id number] Class: Print  . Order #: [id number] Class: Normal  . Order #: [id number] Class: Normal    Allergies Sulfa drugs cross reactors  Family History  Problem Relation Age of Onset  . Rheum arthritis Mother   . Rheum arthritis Sister   . Arthritis Maternal Uncle   . Arthritis Paternal Uncle   . Diabetes Maternal Grandmother     Social History Social History   Tobacco Use  . Smoking status: Former Research scientist (life sciences)  . Smokeless tobacco: Never Used  Substance Use Topics  . Alcohol use: Yes    Comment: occ  . Drug use: Yes    Types: Marijuana     Constitutional: No fever/chills. Positive generalized weakness and lightheadedness.  Eyes: Positive blurry vision.  ENT: No sore throat. Positive tingling in the lips and tongue.  Cardiovascular: Denies chest pain. Respiratory: Denies shortness of breath. Gastrointestinal: No abdominal pain.  No nausea, no vomiting.  No  diarrhea.  No constipation. Genitourinary: Negative for dysuria. Musculoskeletal: Negative for back pain. Positive right shoulder pain.  Skin: Negative for rash. Neurological: Negative for headaches, focal weakness or numbness.  10-point ROS otherwise negative.  ____________________________________________   PHYSICAL EXAM:  VITAL SIGNS: ED Triage Vitals  Enc Vitals Group     BP 08/07/17 1806 (!)  143/88     Pulse Rate 08/07/17 1806 62     Resp 08/07/17 1806 16     Temp 08/07/17 1806 98.9 F (37.2 C)     Temp src --      SpO2 08/07/17 1806 100 %     Weight 08/07/17 1805 255 lb (115.7 kg)     Height 08/07/17 1805 5\' 6"  (1.676 m)     Pain Score 08/07/17 1805 8   Constitutional: Alert and oriented. Well appearing and in no acute distress. Eyes: Conjunctivae are normal. PERRL. EOMI. Head: Atraumatic. Nose: No congestion/rhinnorhea. Mouth/Throat: Mucous membranes are moist.  Neck: No stridor. Cardiovascular: Normal rate, regular rhythm. Good peripheral circulation. Grossly normal heart sounds.   Respiratory: Normal respiratory effort.  No retractions. Lungs CTAB. Gastrointestinal: Soft and nontender. No distention.  Musculoskeletal: No lower extremity tenderness nor edema. No gross deformities of extremities. Tenderness to palpation along the right trapezius.  Neurologic:  Normal speech and language. No gross focal neurologic deficits are appreciated.  Skin:  Skin is warm, dry and intact. No rash noted.  ____________________________________________   LABS (all labs ordered are listed, but only abnormal results are displayed)  Labs Reviewed  COMPREHENSIVE METABOLIC PANEL - Abnormal; Notable for the following components:      Result Value   Glucose, Bld 121 (*)    Calcium 8.5 (*)    Alkaline Phosphatase 33 (*)    All other components within normal limits  CBC WITH DIFFERENTIAL/PLATELET - Abnormal; Notable for the following components:   Hemoglobin 11.9 (*)    HCT 35.4 (*)    All other components within normal limits  URINALYSIS, ROUTINE W REFLEX MICROSCOPIC - Abnormal; Notable for the following components:   APPearance HAZY (*)    All other components within normal limits  CBG MONITORING, ED - Abnormal; Notable for the following components:   Glucose-Capillary 127 (*)    All other components within normal limits  TROPONIN I  PREGNANCY, URINE    ____________________________________________  EKG   EKG Interpretation  Date/Time:  Wednesday August 07 2017 18:29:37 EDT Ventricular Rate:  58 PR Interval:    QRS Duration: 94 QT Interval:  414 QTC Calculation: 407 R Axis:   50 Text Interpretation:  Sinus rhythm Borderline short PR interval No STEMI  Confirmed by Nanda Quinton [phone]) on 08/07/2017 6:39:40 PM       ____________________________________________  RADIOLOGY  Dg Chest 2 View  Result Date: 08/07/2017 CLINICAL DATA:  Chest pain radiating to right shoulder for 3 days. EXAM: CHEST - 2 VIEW COMPARISON:  07/30/2016 FINDINGS: The heart size and mediastinal contours are within normal limits. Both lungs are clear. The visualized skeletal structures are unremarkable. IMPRESSION: No active cardiopulmonary disease. Electronically Signed   By: Earle Gell M.D.   On: 08/07/2017 19:30   Dg Shoulder Right  Result Date: 08/07/2017 CLINICAL DATA:  Right shoulder pain for 3 days.  No recent injury. EXAM: RIGHT SHOULDER - 2+ VIEW COMPARISON:  None. FINDINGS: There is no evidence of fracture or dislocation. There is no evidence of arthropathy or other focal bone abnormality. Soft tissues are unremarkable. IMPRESSION: Negative. Electronically Signed  By: Dorise Bullion III M.D   On: 08/07/2017 19:30    ____________________________________________   PROCEDURES  Procedure(s) performed:   Procedures  None ____________________________________________   INITIAL IMPRESSION / ASSESSMENT AND PLAN / ED COURSE  Pertinent labs & imaging results that were available during my care of the patient were reviewed by me and considered in my medical decision making (see chart for details).  Patient presents to the emergency department for evaluation of multiple complaints.  Most seem to be stemming from discontinuation of her diabetes medication due to inability to afford the needles.  She does not dissipate in getting her first paycheck soon  and should be able to afford them at that time.  Plan for screening labs to rule out diabetic emergency.  The patient's right shoulder pain does not appear to be cardiac in nature.  She has tenderness along the trapezius.  No concern for septic joint in the shoulder.  Patient's lightheadedness and tingling in the tongue/lips is nonspecific and may be related to elevated blood sugar or other metabolic derangement.   Plain films and labs reviewed. Symptoms resolving in the ED with Toradol. Possibly some component of migraine HA. No neuro deficits on exam to suggest CVA. Plan for MSK relaxer for shoulder strain with possible radicular pain component. No indication for emergent MRI of the c-spine but advised close PCP follow up. Offered refill Rx for DM needles but patient states she has an Rx and can refill on Friday.   At this time, I do not feel there is any life-threatening condition present. I have reviewed and discussed all results (EKG, imaging, lab, urine as appropriate), exam findings with patient. I have reviewed nursing notes and appropriate previous records.  I feel the patient is safe to be discharged home without further emergent workup. Discussed usual and customary return precautions. Patient and family (if present) verbalize understanding and are comfortable with this plan.  Patient will follow-up with their primary care provider. If they do not have a primary care provider, information for follow-up has been provided to them. All questions have been answered.  ____________________________________________  FINAL CLINICAL IMPRESSION(S) / ED DIAGNOSES  Final diagnoses:  Acute pain of right shoulder  Lightheadedness  Acute non intractable tension-type headache     MEDICATIONS GIVEN DURING THIS VISIT:  Medications  ketorolac (TORADOL) 30 MG/ML injection 30 mg (30 mg Intravenous Given 08/07/17 2032)     NEW OUTPATIENT MEDICATIONS STARTED DURING THIS VISIT:  Discharge Medication List  as of 08/07/2017  8:29 PM    START taking these medications   Details  ibuprofen (ADVIL,MOTRIN) 800 MG tablet Take 1 tablet (800 mg total) by mouth every 8 (eight) hours as needed., Starting Wed 08/07/2017, Print    methocarbamol (ROBAXIN) 500 MG tablet Take 1 tablet (500 mg total) by mouth every 6 (six) hours as needed for muscle spasms., Starting Wed 08/07/2017, Print        Note:  This document was prepared using Dragon voice recognition software and may include unintentional dictation errors.  Nanda Quinton, MD Emergency Medicine    Long, Wonda Olds, MD 08/08/17 1113    Margette Fast, MD 08/08/17 1118

## 2017-08-07 NOTE — ED Triage Notes (Signed)
Pt c/o right shoulder pain and numbness x 3 days

## 2017-08-08 ENCOUNTER — Ambulatory Visit: Payer: Self-pay | Attending: Family Medicine | Admitting: Physician Assistant

## 2017-08-08 VITALS — BP 131/78 | HR 66 | Temp 98.2°F | Resp 18 | Ht 66.0 in | Wt 258.0 lb

## 2017-08-08 DIAGNOSIS — L0231 Cutaneous abscess of buttock: Secondary | ICD-10-CM | POA: Insufficient documentation

## 2017-08-08 DIAGNOSIS — E119 Type 2 diabetes mellitus without complications: Secondary | ICD-10-CM | POA: Insufficient documentation

## 2017-08-08 DIAGNOSIS — Z79899 Other long term (current) drug therapy: Secondary | ICD-10-CM | POA: Insufficient documentation

## 2017-08-08 DIAGNOSIS — Z882 Allergy status to sulfonamides status: Secondary | ICD-10-CM | POA: Insufficient documentation

## 2017-08-08 DIAGNOSIS — Z09 Encounter for follow-up examination after completed treatment for conditions other than malignant neoplasm: Secondary | ICD-10-CM

## 2017-08-08 DIAGNOSIS — Z794 Long term (current) use of insulin: Secondary | ICD-10-CM | POA: Insufficient documentation

## 2017-08-08 DIAGNOSIS — Z5189 Encounter for other specified aftercare: Secondary | ICD-10-CM

## 2017-08-08 LAB — GLUCOSE, POCT (MANUAL RESULT ENTRY): POC Glucose: 100 mg/dl — AB (ref 70–99)

## 2017-08-08 LAB — POCT GLYCOSYLATED HEMOGLOBIN (HGB A1C): HbA1c, POC (controlled diabetic range): 7.7 % — AB (ref 0.0–7.0)

## 2017-08-08 MED ORDER — FLUCONAZOLE 150 MG PO TABS: 150.0000 mg | ORAL_TABLET | Freq: Once | ORAL | 0 refills | Status: AC

## 2017-08-08 MED ORDER — LIRAGLUTIDE 18 MG/3ML ~~LOC~~ SOPN: 0.6000 mg | PEN_INJECTOR | Freq: Every day | SUBCUTANEOUS | 6 refills | Status: DC

## 2017-08-08 NOTE — Patient Instructions (Signed)
Eliminate sugars and white carbohydrates from your diet.     Diabetes Mellitus and Nutrition When you have diabetes (diabetes mellitus), it is very important to have healthy eating habits because your blood sugar (glucose) levels are greatly affected by what you eat and drink. Eating healthy foods in the appropriate amounts, at about the same times every day, can help you:  Control your blood glucose.  Lower your risk of heart disease.  Improve your blood pressure.  Reach or maintain a healthy weight.  Every person with diabetes is different, and each person has different needs for a meal plan. Your health care provider may recommend that you work with a diet and nutrition specialist (dietitian) to make a meal plan that is best for you. Your meal plan may vary depending on factors such as:  The calories you need.  The medicines you take.  Your weight.  Your blood glucose, blood pressure, and cholesterol levels.  Your activity level.  Other health conditions you have, such as heart or kidney disease.  How do carbohydrates affect me? Carbohydrates affect your blood glucose level more than any other type of food. Eating carbohydrates naturally increases the amount of glucose in your blood. Carbohydrate counting is a method for keeping track of how many carbohydrates you eat. Counting carbohydrates is important to keep your blood glucose at a healthy level, especially if you use insulin or take certain oral diabetes medicines. It is important to know how many carbohydrates you can safely have in each meal. This is different for every person. Your dietitian can help you calculate how many carbohydrates you should have at each meal and for snack. Foods that contain carbohydrates include:  Bread, cereal, rice, pasta, and crackers.  Potatoes and corn.  Peas, beans, and lentils.  Milk and yogurt.  Fruit and juice.  Desserts, such as cakes, cookies, ice cream, and candy.  How does  alcohol affect me? Alcohol can cause a sudden decrease in blood glucose (hypoglycemia), especially if you use insulin or take certain oral diabetes medicines. Hypoglycemia can be a life-threatening condition. Symptoms of hypoglycemia (sleepiness, dizziness, and confusion) are similar to symptoms of having too much alcohol. If your health care provider says that alcohol is safe for you, follow these guidelines:  Limit alcohol intake to no more than 1 drink per day for nonpregnant women and 2 drinks per day for men. One drink equals 12 oz of beer, 5 oz of wine, or 1 oz of hard liquor.  Do not drink on an empty stomach.  Keep yourself hydrated with water, diet soda, or unsweetened iced tea.  Keep in mind that regular soda, juice, and other mixers may contain a lot of sugar and must be counted as carbohydrates.  What are tips for following this plan? Reading food labels  Start by checking the serving size on the label. The amount of calories, carbohydrates, fats, and other nutrients listed on the label are based on one serving of the food. Many foods contain more than one serving per package.  Check the total grams (g) of carbohydrates in one serving. You can calculate the number of servings of carbohydrates in one serving by dividing the total carbohydrates by 15. For example, if a food has 30 g of total carbohydrates, it would be equal to 2 servings of carbohydrates.  Check the number of grams (g) of saturated and trans fats in one serving. Choose foods that have low or no amount of these fats.  Check  the number of milligrams (mg) of sodium in one serving. Most people should limit total sodium intake to less than 2,300 mg per day.  Always check the nutrition information of foods labeled as "low-fat" or "nonfat". These foods may be higher in added sugar or refined carbohydrates and should be avoided.  Talk to your dietitian to identify your daily goals for nutrients listed on the  label. Shopping  Avoid buying canned, premade, or processed foods. These foods tend to be high in fat, sodium, and added sugar.  Shop around the outside edge of the grocery store. This includes fresh fruits and vegetables, bulk grains, fresh meats, and fresh dairy. Cooking  Use low-heat cooking methods, such as baking, instead of high-heat cooking methods like deep frying.  Cook using healthy oils, such as olive, canola, or sunflower oil.  Avoid cooking with butter, cream, or high-fat meats. Meal planning  Eat meals and snacks regularly, preferably at the same times every day. Avoid going long periods of time without eating.  Eat foods high in fiber, such as fresh fruits, vegetables, beans, and whole grains. Talk to your dietitian about how many servings of carbohydrates you can eat at each meal.  Eat 4-6 ounces of lean protein each day, such as lean meat, chicken, fish, eggs, or tofu. 1 ounce is equal to 1 ounce of meat, chicken, or fish, 1 egg, or 1/4 cup of tofu.  Eat some foods each day that contain healthy fats, such as avocado, nuts, seeds, and fish. Lifestyle   Check your blood glucose regularly.  Exercise at least 30 minutes 5 or more days each week, or as told by your health care provider.  Take medicines as told by your health care provider.  Do not use any products that contain nicotine or tobacco, such as cigarettes and e-cigarettes. If you need help quitting, ask your health care provider.  Work with a Social worker or diabetes educator to identify strategies to manage stress and any emotional and social challenges. What are some questions to ask my health care provider?  Do I need to meet with a diabetes educator?  Do I need to meet with a dietitian?  What number can I call if I have questions?  When are the best times to check my blood glucose? Where to find more information:  American Diabetes Association: diabetes.org/food-and-fitness/food  Academy of  Nutrition and Dietetics: PokerClues.dk  [employer] of Diabetes and Digestive and Kidney Diseases (NIH): ContactWire.be Summary  A healthy meal plan will help you control your blood glucose and maintain a healthy lifestyle.  Working with a diet and nutrition specialist (dietitian) can help you make a meal plan that is best for you.  Keep in mind that carbohydrates and alcohol have immediate effects on your blood glucose levels. It is important to count carbohydrates and to use alcohol carefully. This information is not intended to replace advice given to you by your health care provider. Make sure you discuss any questions you have with your health care provider. Document Released: 11/09/2004 Document Revised: 03/19/2016 Document Reviewed: 03/19/2016 Elsevier Interactive Patient Education  [employer].

## 2017-08-30 MED FILL — MEDROXYPROGESTERONE 10 MG T: 10 | 28 days supply | Qty: 7 | Fill #1

## 2017-09-19 MED FILL — TRUEPLUS PEN NDL 31GX3/16: 31G X 5 MM | 25 days supply | Qty: 100 | Fill #0

## 2017-09-19 MED FILL — !VICTOZA 18MG/3ML INJECT: 18 | 28 days supply | Qty: 3 | Fill #0

## 2017-09-19 MED FILL — TRUEPLUS PEN NDL 31GX3/16": 31G X 5 MM | 25 days supply | Qty: 100 | Fill #0

## 2017-09-19 MED FILL — TRUE METRIX TEST STRIP: 15 days supply | Qty: 50 | Fill #2

## 2017-09-20 ENCOUNTER — Telehealth: Payer: Self-pay

## 2017-09-20 DIAGNOSIS — N939 Abnormal uterine and vaginal bleeding, unspecified: Secondary | ICD-10-CM

## 2017-09-20 NOTE — Telephone Encounter (Signed)
Patient came into office and states that she read that Provera has to many side effects and want another medication to regulate her bleeding.

## 2017-09-20 NOTE — Telephone Encounter (Signed)
Patient was called and informed of referral. 

## 2017-09-20 NOTE — Telephone Encounter (Signed)
I have referred her to GYN 

## 2017-10-17 ENCOUNTER — Ambulatory Visit: Payer: Self-pay | Attending: Family Medicine | Admitting: Physician Assistant

## 2017-10-17 VITALS — BP 118/75 | HR 74 | Temp 98.2°F | Resp 18 | Ht 66.0 in | Wt 247.0 lb

## 2017-10-17 DIAGNOSIS — E119 Type 2 diabetes mellitus without complications: Secondary | ICD-10-CM | POA: Insufficient documentation

## 2017-10-17 DIAGNOSIS — Z791 Long term (current) use of non-steroidal anti-inflammatories (NSAID): Secondary | ICD-10-CM | POA: Insufficient documentation

## 2017-10-17 DIAGNOSIS — Z882 Allergy status to sulfonamides status: Secondary | ICD-10-CM | POA: Insufficient documentation

## 2017-10-17 DIAGNOSIS — Z794 Long term (current) use of insulin: Secondary | ICD-10-CM | POA: Insufficient documentation

## 2017-10-17 DIAGNOSIS — Z79899 Other long term (current) drug therapy: Secondary | ICD-10-CM | POA: Insufficient documentation

## 2017-10-17 DIAGNOSIS — H6981 Other specified disorders of Eustachian tube, right ear: Secondary | ICD-10-CM | POA: Insufficient documentation

## 2017-10-17 LAB — GLUCOSE, POCT (MANUAL RESULT ENTRY): POC GLUCOSE: 128 mg/dL — AB (ref 70–99)

## 2017-10-17 MED ORDER — PSEUDOEPHEDRINE-GUAIFENESIN ER 120-1200 MG PO TB12: 1.0000 | ORAL_TABLET | Freq: Two times a day (BID) | ORAL | 0 refills | Status: DC

## 2017-10-17 MED ORDER — FLUTICASONE PROPIONATE 50 MCG/ACT NA SUSP: 2.0000 | Freq: Every day | NASAL | 6 refills | Status: DC

## 2017-10-17 NOTE — Patient Instructions (Signed)
Eustachian Tube Dysfunction The eustachian tube connects the middle ear to the back of the nose. It regulates air pressure in the middle ear by allowing air to move between the ear and nose. It also helps to drain fluid from the middle ear space. When the eustachian tube does not function properly, air pressure, fluid, or both can build up in the middle ear. Eustachian tube dysfunction can affect one or both ears. What are the causes? This condition happens when the eustachian tube becomes blocked or cannot open normally. This may result from:  Ear infections.  Colds and other upper respiratory infections.  Allergies.  Irritation, such as from cigarette smoke or acid from the stomach coming up into the esophagus (gastroesophageal reflux).  Sudden changes in air pressure, such as from descending in an airplane.  Abnormal growths in the nose or throat, such as nasal polyps, tumors, or enlarged tissue at the back of the throat (adenoids).  What increases the risk? This condition may be more likely to develop in people who smoke and people who are overweight. Eustachian tube dysfunction may also be more likely to develop in children, especially children who have:  Certain birth defects of the mouth, such as cleft palate.  Large tonsils and adenoids.  What are the signs or symptoms? Symptoms of this condition may include:  A feeling of fullness in the ear.  Ear pain.  Clicking or popping noises in the ear.  Ringing in the ear.  Hearing loss.  Loss of balance.  Symptoms may get worse when the air pressure around you changes, such as when you travel to an area of high elevation or fly on an airplane. How is this diagnosed? This condition may be diagnosed based on:  Your symptoms.  A physical exam of your ear, nose, and throat.  Tests, such as those that measure: ? The movement of your eardrum (tympanogram). ? Your hearing (audiometry).  How is this treated? Treatment  depends on the cause and severity of your condition. If your symptoms are mild, you may be able to relieve your symptoms by moving air into ("popping") your ears. If you have symptoms of fluid in your ears, treatment may include:  Decongestants.  Antihistamines.  Nasal sprays or ear drops that contain medicines that reduce swelling (steroids).  In some cases, you may need to have a procedure to drain the fluid in your eardrum (myringotomy). In this procedure, a small tube is placed in the eardrum to:  Drain the fluid.  Restore the air in the middle ear space.  Follow these instructions at home:  Take over-the-counter and prescription medicines only as told by your health care provider.  Use techniques to help pop your ears as recommended by your health care provider. These may include: ? Chewing gum. ? Yawning. ? Frequent, forceful swallowing. ? Closing your mouth, holding your nose closed, and gently blowing as if you are trying to blow air out of your nose.  Do not do any of the following until your health care provider approves: ? Travel to high altitudes. ? Fly in airplanes. ? Work in a pressurized cabin or room. ? Scuba dive.  Keep your ears dry. Dry your ears completely after showering or bathing.  Do not smoke.  Keep all follow-up visits as told by your health care provider. This is important. Contact a health care provider if:  Your symptoms do not go away after treatment.  Your symptoms come back after treatment.  You are   unable to pop your ears.  You have: ? A fever. ? Pain in your ear. ? Pain in your head or neck. ? Fluid draining from your ear.  Your hearing suddenly changes.  You become very dizzy.  You lose your balance. This information is not intended to replace advice given to you by your health care provider. Make sure you discuss any questions you have with your health care provider. Document Released: 03/11/2015 Document Revised: 07/21/2015  Document Reviewed: 03/03/2014 Elsevier Interactive Patient Education  2018 [employer].  

## 2017-10-17 NOTE — Progress Notes (Signed)
[name], female   DOB: 1980-09-13, 37 y.o.   MRN: [id number]   Colleen Downs, is a 37 y.o. female  DUK:[id number]  BJS:[id number]  DOB - [birth year]  Subjective:  Chief Complaint and HPI: Colleen Downs is a 37 y.o. female here today for decreased hearing in her R ear for the last few days.  Several days ago she had ear pain and a ST.  These have resolved.  She has had some sinus congestion.  No f/c.  No cough.    ROS:   Constitutional:  No f/c, No night sweats, No unexplained weight loss. EENT:  No vision changes, No blurry vision, No hearing changes.  Respiratory: No cough, No SOB Cardiac: No CP, no palpitations GI:  No abd pain, No N/V/D. GU: No Urinary s/sx Musculoskeletal: No joint pain Neuro: No headache, no dizziness, no motor weakness.  Skin: No rash Endocrine:  No polydipsia. No polyuria.  Psych: Denies SI/HI  No problems updated.  ALLERGIES: Allergies  Allergen Reactions  . Sulfa Drugs Cross Reactors Anaphylaxis    PAST MEDICAL HISTORY: Past Medical History:  Diagnosis Date  . Diarrhea   . Irregular periods     MEDICATIONS AT HOME: Prior to Admission medications   Medication Sig Start Date End Date Taking? Authorizing Provider  Blood Glucose Monitoring Suppl (TRUE METRIX METER) w/Device KIT Use as directed 04/03/17  Yes Ladell Pier, MD  dicyclomine (BENTYL) 10 MG capsule Take 1 capsule (10 mg total) by mouth 3 (three) times daily before meals. 07/29/17  Yes Charlott Rakes, MD  glucose blood (TRUE METRIX BLOOD GLUCOSE TEST) test strip Use as instructed 12/24/16  Yes Ladell Pier, MD  ibuprofen (ADVIL,MOTRIN) 800 MG tablet Take 1 tablet (800 mg total) by mouth every 8 (eight) hours as needed. 08/07/17  Yes Long, Wonda Olds, MD  Insulin Pen Needle 31G X 5 MM MISC 1 each by Does not apply route at bedtime. 04/02/17  Yes Charlott Rakes, MD  liraglutide (VICTOZA) 18 MG/3ML SOPN Inject 0.1 mLs (0.6 mg total) into the skin daily with breakfast.  08/08/17  Yes Jenilee Franey M, PA-C  medroxyPROGESTERone (PROVERA) 10 MG tablet TAKE 1 TABLET BY MOUTH DAILY FOR 7 DAYS AT THE BEGINNING OF EACH MONTH 07/26/17  Yes Charlott Rakes, MD  methocarbamol (ROBAXIN) 500 MG tablet Take 1 tablet (500 mg total) by mouth every 6 (six) hours as needed for muscle spasms. 08/07/17  Yes Long, Wonda Olds, MD  TRUEPLUS LANCETS 28G MISC Use as directed 12/24/16  Yes Ladell Pier, MD  fluticasone [facility]) 50 MCG/ACT nasal spray Place 2 sprays into both nostrils daily. 10/17/17   Argentina Donovan, PA-C  loperamide (IMODIUM A-D) 2 MG tablet Take 1 tablet (2 mg total) by mouth 4 (four) times daily as needed for diarrhea or loose stools. Patient not taking: Reported on 02/07/2017 12/24/16   Ladell Pier, MD  ondansetron (ZOFRAN ODT) 4 MG disintegrating tablet Take 1 tablet (4 mg total) by mouth every 8 (eight) hours as needed for nausea or vomiting. Patient not taking: Reported on 02/07/2017 12/03/16   Joy, Raquel Sarna C, PA-C  polyethylene glycol powder (GLYCOLAX/MIRALAX) powder Take 17 g by mouth daily as needed for moderate constipation. Patient not taking: Reported on 02/07/2017 12/24/16   Ladell Pier, MD  pramoxine-hydrocortisone [facility]) 1-1 % rectal cream Place 1 application rectally 2 (two) times daily. Patient not taking: Reported on 04/02/2017 03/19/17   Charlott Rakes, MD  promethazine (PHENERGAN) 25 MG tablet  Take 1 tablet (25 mg total) by mouth every 6 (six) hours as needed for nausea. 08/01/11 08/08/11  Hunt, Bethany, PA-C  Pseudoephedrine-Guaifenesin [phone] MG TB12 Take 1 tablet by mouth 2 (two) times daily. 10/17/17   Argentina Donovan, PA-C  triamcinolone cream (KENALOG) 0.1 % Apply 1 application topically 2 (two) times daily. Patient not taking: Reported on 03/19/2017 02/07/17   Ladell Pier, MD     Objective:  EXAM:   Vitals:   10/17/17 1514  BP: 118/75  Pulse: 74  Resp: 18  Temp: 98.2 F (36.8 C)  TempSrc: Oral  SpO2: 99%   Weight: 247 lb (112 kg)  Height: '5\' 6"'$  (1.676 m)    General appearance : A&OX3. NAD. Non-toxic-appearing HEENT: Atraumatic and Normocephalic.  PERRLA. EOM intact.  R TM bulging and distorted light reflex but no erythema.  L TM WNL.   Mouth-MMM, post pharynx WNL w/o erythema, No PND. Neck: supple, no JVD. No cervical lymphadenopathy. No thyromegaly Chest/Lungs:  Breathing-non-labored, Good air entry bilaterally, breath sounds normal without rales, rhonchi, or wheezing  CVS: S1 S2 regular, no murmurs, gallops, rubs  Extremities: Bilateral Lower Ext shows no edema, both legs are warm to touch with = pulse throughout Neurology:  CN II-XII grossly intact, Non focal.   Psych:  TP linear. J/I WNL. Normal speech. Appropriate eye contact and affect.  Skin:  No Rash  Data Review Lab Results  Component Value Date   HGBA1C 7.7 (A) 08/08/2017   HGBA1C 7.3 03/19/2017   HGBA1C 7.6 12/24/2016     Assessment & Plan   1. Eustachian tube dysfunction, right - fluticasone (FLONASE) 50 MCG/ACT nasal spray; Place 2 sprays into both nostrils daily.  Dispense: 16 g; Refill: 6 - Pseudoephedrine-Guaifenesin [phone] MG TB12; Take 1 tablet by mouth 2 (two) times daily.  Dispense: 28 each; Refill: 0  2. Type 2 diabetes mellitus without complication, without long-term current use of insulin (HCC) Blood sugar 128 today.  Continue current regimen - Glucose (CBG)     Patient have been counseled extensively about nutrition and exercise  Return for keep 9/16 appt with Dr Margarita Rana.  The patient was given clear instructions to go to ER or return to medical center if symptoms don't improve, worsen or new problems develop. The patient verbalized understanding. The patient was told to call to get lab results if they haven't heard anything in the next week.     Freeman Caldron, PA-C [facility] [place], [place]   10/17/2017, 3:26 PM

## 2017-10-18 ENCOUNTER — Encounter: Payer: Self-pay | Admitting: Obstetrics & Gynecology

## 2017-10-25 MED FILL — !VICTOZA 18MG/3ML INJECT: 18 | 28 days supply | Qty: 3 | Fill #1

## 2017-11-11 ENCOUNTER — Ambulatory Visit: Payer: Self-pay | Admitting: Family Medicine

## 2017-11-25 ENCOUNTER — Encounter: Payer: Self-pay | Admitting: Family Medicine

## 2017-11-25 ENCOUNTER — Ambulatory Visit (INDEPENDENT_AMBULATORY_CARE_PROVIDER_SITE_OTHER): Payer: Self-pay | Admitting: Obstetrics & Gynecology

## 2017-11-25 ENCOUNTER — Encounter: Payer: Self-pay | Admitting: Obstetrics & Gynecology

## 2017-11-25 VITALS — BP 120/72 | HR 60 | Ht 66.0 in | Wt 246.8 lb

## 2017-11-25 DIAGNOSIS — N939 Abnormal uterine and vaginal bleeding, unspecified: Secondary | ICD-10-CM

## 2017-11-25 MED ORDER — NORGESTIM-ETH ESTRAD TRIPHASIC 0.18/0.215/0.25 MG-35 MCG PO TABS: 1.0000 | ORAL_TABLET | Freq: Every day | ORAL | 11 refills | Status: DC

## 2017-11-25 NOTE — Progress Notes (Signed)
Here for irregular periods. C/o nausea and stomach griping because time for period.

## 2017-11-25 NOTE — Patient Instructions (Signed)
Metrorrhagia Metrorrhagia is bleeding from the uterus that is not normal. The bleeding usually happens between periods. It happens often. Follow these instructions at home: Pay attention to changes in your symptoms. Follow these instructions to help with your condition: Eating and drinking  Eat many kinds of foods.  Eat foods that have the nutrient called iron. Some foods with iron are: ? Liver. ? Meat. ? Shellfish. ? Green leafy vegetables. ? Eggs.  If you have trouble going poop (constipation): ? Drink plenty of water. ? Eat fruits and vegetables that have a lot of fiber, such as spinach, carrots, raspberries, apples, and mango. Medicines  Take over-the-counter and prescription medicines only as told by your doctor.  Do not change medicines without talking with your doctor.  Do not take aspirin or medicines that have aspirin: ? During the week before your period. ? During your period.  Take iron pills exactly as told by your doctor. Activity  If you need to change your pad or tampon more than one time in 2 hours: ? Lie in bed with your feet raised (elevated). ? Put a cold pack on your lower belly (abdomen). ? Rest as much as possible.  Do not try to lose weight until the bleeding has stopped and your blood iron level is okay. Other Instructions  For two months, write down: ? When your period starts. ? When your period ends. ? When you have bleeding that is not during your period. ? What problems you notice.  Keep all follow-up visits as told by your doctor. This is important. Contact a doctor if:  You feel dizzy.  You feel like you are going to pass out (faint).  You feel weak.  You feel sick to your stomach (nauseous).  You throw up (vomit).  You cannot eat or drink without throwing up.  You feel dizzy while you use medicine.  You have watery poop (diarrhea) while you use medicine.  You want to change the birth control pills or hormones that you  take.  You want to stop taking birth control pills or hormones. Get help right away if:  You have a fever.  You have chills.  You need to change your pad or tampon more than one time in an hour.  You have more bleeding from your vagina than before.  You have clumps of blood coming from your vagina.  You have pain in your belly.  You pass out.  You have a rash. This information is not intended to replace advice given to you by your health care provider. Make sure you discuss any questions you have with your health care provider. Document Released: 05/07/2011 Document Revised: 07/21/2015 Document Reviewed: 05/10/2014 Elsevier Interactive Patient Education  [employer].

## 2017-11-25 NOTE — Progress Notes (Signed)
[name], female   DOB: 13-Sep-1980, 37 y.o.   MRN: [id number]  Chief Complaint  Patient presents with  . Menstrual Problem    HPI Colleen Downs is a 37 y.o. female.  G0P0000 Patient's last menstrual period was 10/27/2017. She was referred for evaluation of irregular menses which she says has been lifelong. Her PCP prescribed Provera cyclic until recently but now wants her be managed by Gyn. Last pap was in the last year and was normal. She has also used OCP in the past. She has been diagnosed with PCOS, normal [place]. HPI  Past Medical History:  Diagnosis Date  . Diarrhea   . Irregular periods     Past Surgical History:  Procedure Laterality Date  . CHOLECYSTECTOMY      Family History  Problem Relation Age of Onset  . Rheum arthritis Mother   . Rheum arthritis Sister   . Arthritis Maternal Uncle   . Arthritis Paternal Uncle   . Diabetes Maternal Grandmother     Social History Social History   Tobacco Use  . Smoking status: Former Research scientist (life sciences)  . Smokeless tobacco: Never Used  Substance Use Topics  . Alcohol use: Yes    Comment: occ  . Drug use: Yes    Types: Marijuana    Allergies  Allergen Reactions  . Sulfa Drugs Cross Reactors Anaphylaxis    Current Outpatient Medications  Medication Sig Dispense Refill  . Blood Glucose Monitoring Suppl (TRUE METRIX METER) w/Device KIT Use as directed 1 kit 0  . glucose blood (TRUE METRIX BLOOD GLUCOSE TEST) test strip Use as instructed 100 each 12  . Insulin Pen Needle 31G X 5 MM MISC 1 each by Does not apply route at bedtime. 30 each 5  . liraglutide (VICTOZA) 18 MG/3ML SOPN Inject 0.1 mLs (0.6 mg total) into the skin daily with breakfast. 3 mL 6  . Pseudoephedrine-Guaifenesin [phone] MG TB12 Take 1 tablet by mouth 2 (two) times daily. 28 each 0  . TRUEPLUS LANCETS 28G MISC Use as directed 100 each 1  . dicyclomine (BENTYL) 10 MG capsule Take 1 capsule (10 mg total) by mouth 3 (three) times daily before meals. 90  capsule 1  . fluticasone (FLONASE) 50 MCG/ACT nasal spray Place 2 sprays into both nostrils daily. 16 g 6  . glucose blood test strip USE AS INSTRUCTED  12  . ibuprofen (ADVIL,MOTRIN) 800 MG tablet Take 1 tablet (800 mg total) by mouth every 8 (eight) hours as needed. 21 tablet 0  . loperamide (IMODIUM A-D) 2 MG tablet Take 1 tablet (2 mg total) by mouth 4 (four) times daily as needed for diarrhea or loose stools. (Patient not taking: Reported on 02/07/2017) 30 tablet 0  . medroxyPROGESTERone (PROVERA) 10 MG tablet TAKE 1 TABLET BY MOUTH DAILY FOR 7 DAYS AT THE BEGINNING OF EACH MONTH 7 tablet 1  . methocarbamol (ROBAXIN) 500 MG tablet Take 1 tablet (500 mg total) by mouth every 6 (six) hours as needed for muscle spasms. 15 tablet 0  . ondansetron (ZOFRAN ODT) 4 MG disintegrating tablet Take 1 tablet (4 mg total) by mouth every 8 (eight) hours as needed for nausea or vomiting. (Patient not taking: Reported on 02/07/2017) 20 tablet 0  . polyethylene glycol powder (GLYCOLAX/MIRALAX) powder Take 17 g by mouth daily as needed for moderate constipation. (Patient not taking: Reported on 02/07/2017) 3350 g 1  . pramoxine-hydrocortisone (ANALPRAM-HC) 1-1 % rectal cream Place 1 application rectally 2 (two) times daily. (Patient not  taking: Reported on 04/02/2017) 30 g 1  . promethazine (PHENERGAN) 25 MG tablet Take 1 tablet (25 mg total) by mouth every 6 (six) hours as needed for nausea. 30 tablet 0  . triamcinolone cream (KENALOG) 0.1 % Apply 1 application topically 2 (two) times daily. (Patient not taking: Reported on 03/19/2017) 30 g 0  . VICTOZA 18 MG/3ML SOPN Inject 0.1 mLs into the skin daily.  6   No current facility-administered medications for this visit.     Review of Systems Review of Systems  Constitutional: Negative for unexpected weight change.  Respiratory: Negative.   Genitourinary: Positive for menstrual problem (menses irregular, may have several months of amenorrhea).  Skin:        Facial hirsutism  Neurological: Negative.     Blood pressure 120/72, pulse 60, height '5\' 6"'$  (1.676 m), weight 246 lb 12.8 oz (111.9 kg), last menstrual period 10/27/2017.  Physical Exam Physical Exam  Constitutional: She is oriented to person, place, and time. She appears well-developed. No distress.  Pulmonary/Chest: Effort normal.  Abdominal: Soft. She exhibits no mass. There is no tenderness.  Genitourinary: Vagina normal and uterus normal. No vaginal discharge found.  Neurological: She is alert and oriented to person, place, and time.  Skin: Skin is warm and dry.  Psychiatric: She has a normal mood and affect. Her behavior is normal.    Data Reviewed Pap result [place] result  Assessment    Menstrual irregularity and hirsutism consistent with PCOS without abnormal US findings    Plan    Tri Sprintec for cycle control and control of signes of hyperandrogenism. She will return for labs once financial assistance is confirmed, androgens LH       Emeterio Reeve 11/25/2017, 10:39 AM

## 2017-12-04 ENCOUNTER — Ambulatory Visit: Payer: Self-pay | Attending: Family Medicine | Admitting: Family Medicine

## 2017-12-04 ENCOUNTER — Encounter: Payer: Self-pay | Admitting: Family Medicine

## 2017-12-04 VITALS — BP 119/81 | HR 69 | Temp 98.1°F | Ht 66.0 in | Wt 242.8 lb

## 2017-12-04 DIAGNOSIS — N939 Abnormal uterine and vaginal bleeding, unspecified: Secondary | ICD-10-CM | POA: Insufficient documentation

## 2017-12-04 DIAGNOSIS — Z79899 Other long term (current) drug therapy: Secondary | ICD-10-CM | POA: Insufficient documentation

## 2017-12-04 DIAGNOSIS — E119 Type 2 diabetes mellitus without complications: Secondary | ICD-10-CM | POA: Insufficient documentation

## 2017-12-04 DIAGNOSIS — Z6839 Body mass index (BMI) 39.0-39.9, adult: Secondary | ICD-10-CM | POA: Insufficient documentation

## 2017-12-04 DIAGNOSIS — F319 Bipolar disorder, unspecified: Secondary | ICD-10-CM | POA: Insufficient documentation

## 2017-12-04 DIAGNOSIS — Z794 Long term (current) use of insulin: Secondary | ICD-10-CM | POA: Insufficient documentation

## 2017-12-04 DIAGNOSIS — Z882 Allergy status to sulfonamides status: Secondary | ICD-10-CM | POA: Insufficient documentation

## 2017-12-04 DIAGNOSIS — E669 Obesity, unspecified: Secondary | ICD-10-CM | POA: Insufficient documentation

## 2017-12-04 DIAGNOSIS — Z9049 Acquired absence of other specified parts of digestive tract: Secondary | ICD-10-CM | POA: Insufficient documentation

## 2017-12-04 DIAGNOSIS — K582 Mixed irritable bowel syndrome: Secondary | ICD-10-CM

## 2017-12-04 DIAGNOSIS — K589 Irritable bowel syndrome without diarrhea: Secondary | ICD-10-CM | POA: Insufficient documentation

## 2017-12-04 LAB — POCT GLYCOSYLATED HEMOGLOBIN (HGB A1C): HBA1C, POC (CONTROLLED DIABETIC RANGE): 6 % (ref 0.0–7.0)

## 2017-12-04 LAB — GLUCOSE, POCT (MANUAL RESULT ENTRY): POC GLUCOSE: 157 mg/dL — AB (ref 70–99)

## 2017-12-04 MED ORDER — LIRAGLUTIDE 18 MG/3ML ~~LOC~~ SOPN: 0.6000 mg | PEN_INJECTOR | Freq: Every day | SUBCUTANEOUS | 6 refills | Status: DC

## 2017-12-04 MED FILL — !VICTOZA 18MG/3ML INJECT: 18 | 28 days supply | Qty: 3 | Fill #2

## 2017-12-04 NOTE — Progress Notes (Signed)
Subjective:  Patient ID: Chaunte Hornbeck, female    DOB: 1980/08/10  Age: 37 y.o. MRN: [id number]  CC: Diabetes   HPI Kearra Calkin  is a 37 year old female with a history of type 2 diabetes mellitus (A1c 7.7), obesity, bipolar disorder, abnormal uterine bleed who presents today for a follow-up visit. She was seen by Dr Roselie Awkward of GYN last month due to abnormal uterine bleed and commenced on OCP (Tri sprintec) with regularization of her periods and the plan is  for her to apply for financial assistance and follow up with GYN. GYN notes reviewed - history of PCOS with hirsutism by normal pelvic ultrasound. Her Diabetes is controlled and A1c is 6.0, down from 7.7 and she reports doing well on Victoza. She has lost 16  Lbs in the last 4 months and has been working out and trying to eat right. Denies presence of neuropathy, hypoglycemia, visual concerns.  She works at a [employer] and has no additional concerns today.  Past Medical History:  Diagnosis Date  . Diarrhea   . Irregular periods     Past Surgical History:  Procedure Laterality Date  . CHOLECYSTECTOMY      Allergies  Allergen Reactions  . Sulfa Drugs Cross Reactors Anaphylaxis     Outpatient Medications Prior to Visit  Medication Sig Dispense Refill  . Blood Glucose Monitoring Suppl (TRUE METRIX METER) w/Device KIT Use as directed 1 kit 0  . glucose blood (TRUE METRIX BLOOD GLUCOSE TEST) test strip Use as instructed 100 each 12  . glucose blood test strip USE AS INSTRUCTED  12  . Insulin Pen Needle 31G X 5 MM MISC 1 each by Does not apply route at bedtime. 30 each 5  . TRUEPLUS LANCETS 28G MISC Use as directed 100 each 1  . liraglutide (VICTOZA) 18 MG/3ML SOPN Inject 0.1 mLs (0.6 mg total) into the skin daily with breakfast. 3 mL 6  . VICTOZA 18 MG/3ML SOPN Inject 0.1 mLs into the skin daily.  6  . Norgestimate-Ethinyl Estradiol Triphasic (TRI-SPRINTEC) 0.18/0.215/0.25 MG-35 MCG tablet Take 1 tablet by mouth daily. 1  Package 11  . promethazine (PHENERGAN) 25 MG tablet Take 1 tablet (25 mg total) by mouth every 6 (six) hours as needed for nausea. 30 tablet 0  . dicyclomine (BENTYL) 10 MG capsule Take 1 capsule (10 mg total) by mouth 3 (three) times daily before meals. 90 capsule 1  . fluticasone (FLONASE) 50 MCG/ACT nasal spray Place 2 sprays into both nostrils daily. 16 g 6  . ibuprofen (ADVIL,MOTRIN) 800 MG tablet Take 1 tablet (800 mg total) by mouth every 8 (eight) hours as needed. 21 tablet 0  . loperamide (IMODIUM A-D) 2 MG tablet Take 1 tablet (2 mg total) by mouth 4 (four) times daily as needed for diarrhea or loose stools. (Patient not taking: Reported on 02/07/2017) 30 tablet 0  . medroxyPROGESTERone (PROVERA) 10 MG tablet TAKE 1 TABLET BY MOUTH DAILY FOR 7 DAYS AT THE BEGINNING OF EACH MONTH 7 tablet 1  . methocarbamol (ROBAXIN) 500 MG tablet Take 1 tablet (500 mg total) by mouth every 6 (six) hours as needed for muscle spasms. 15 tablet 0  . ondansetron (ZOFRAN ODT) 4 MG disintegrating tablet Take 1 tablet (4 mg total) by mouth every 8 (eight) hours as needed for nausea or vomiting. (Patient not taking: Reported on 02/07/2017) 20 tablet 0  . polyethylene glycol powder (GLYCOLAX/MIRALAX) powder Take 17 g by mouth daily as needed for moderate constipation. (  Patient not taking: Reported on 02/07/2017) 3350 g 1  . pramoxine-hydrocortisone (ANALPRAM-HC) 1-1 % rectal cream Place 1 application rectally 2 (two) times daily. (Patient not taking: Reported on 04/02/2017) 30 g 1  . Pseudoephedrine-Guaifenesin [phone] MG TB12 Take 1 tablet by mouth 2 (two) times daily. 28 each 0  . triamcinolone cream (KENALOG) 0.1 % Apply 1 application topically 2 (two) times daily. (Patient not taking: Reported on 03/19/2017) 30 g 0   No facility-administered medications prior to visit.     ROS Review of Systems  Constitutional: Negative for activity change, appetite change and fatigue.  HENT: Negative for congestion, sinus  pressure and sore throat.   Eyes: Negative for visual disturbance.  Respiratory: Negative for cough, chest tightness, shortness of breath and wheezing.   Cardiovascular: Negative for chest pain and palpitations.  Gastrointestinal: Negative for abdominal distention, abdominal pain and constipation.  Endocrine: Negative for polydipsia.  Genitourinary: Negative for dysuria and frequency.  Musculoskeletal: Negative for arthralgias and back pain.  Skin: Negative for rash.  Neurological: Negative for tremors, light-headedness and numbness.  Hematological: Does not bruise/bleed easily.  Psychiatric/Behavioral: Negative for agitation and behavioral problems.    Objective:  BP 119/81   Pulse 69   Temp 98.1 F (36.7 C) (Oral)   Ht _0  (1.676 m)   Wt 242 lb 12.8 oz (110.1 kg)   SpO2 99%   BMI 39.19 kg/m   BP/Weight 12/04/2017 11/25/2017 4/62/7035  Systolic BP 009 381 829  Diastolic BP 81 72 75  Wt. (Lbs) 242.8 246.8 247  BMI 39.19 39.83 39.87      Physical Exam  Constitutional: She is oriented to person, place, and time. She appears well-developed and well-nourished.  HENT:  Right Ear: External ear normal.  Left Ear: External ear normal.  Mouth/Throat: Oropharynx is clear and moist.  Cardiovascular: Normal rate, normal heart sounds and intact distal pulses.  No murmur heard. Pulmonary/Chest: Effort normal and breath sounds normal. She has no wheezes. She has no rales. She exhibits no tenderness.  Abdominal: Soft. Bowel sounds are normal. She exhibits no distension and no mass. There is no tenderness.  Musculoskeletal: Normal range of motion.  Neurological: She is alert and oriented to person, place, and time.  Skin: Skin is warm and dry.  Psychiatric: She has a normal mood and affect.    CMP Latest Ref Rng & Units 08/07/2017 07/29/2017 12/03/2016  Glucose 65 - 99 mg/dL 121(H) 87 131(H)  BUN 6 - 20 mg/dL _1 Creatinine 0.44 - 1.00 mg/dL 0.92 0.75 0.65  Sodium 135 - 145 mmol/L  135 137 135  Potassium 3.5 - 5.1 mmol/L 3.7 4.0 3.6  Chloride 101 - 111 mmol/L 101 99 105  CO2 22 - 32 mmol/L _2 Calcium 8.9 - 10.3 mg/dL 8.5(L) 9.3 8.9  Total Protein 6.5 - 8.1 g/dL 7.8 7.4 7.6  Total Bilirubin 0.3 - 1.2 mg/dL 0.7 0.3 0.3  Alkaline Phos 38 - 126 U/L 33(L) 47 35(L)  AST 15 - 41 U/L _3 ALT 14 - 54 U/L _4 Lipid Panel  No results found for: CHOL, TRIG, HDL, CHOLHDL, VLDL, LDLCALC, LDLDIRECT   Lab Results  Component Value Date   HGBA1C 6.0 12/04/2017    Assessment & Plan:   1. Type 2 diabetes mellitus without complication, without long-term current use of insulin (HCC) Controlled with A1c of 6.0 which has improved from 7.7 Continue current regimen - POCT  glucose (manual entry) - POCT glycosylated hemoglobin (Hb A1C) - liraglutide (VICTOZA) 18 MG/3ML SOPN; Inject 0.1 mLs (0.6 mg total) into the skin daily with breakfast.  Dispense: 3 mL; Refill: 6  2. Irritable bowel syndrome with both constipation and diarrhea She has Bentyl  at home and has been advised to use it as needed for Diarrhea and if constipation develops she is to cut back on the frequency of dosing  3. Abnormal uterine bleeding Currently on OCP Recently seen by GYN and advised to keep follow up   Meds ordered this encounter  Medications  . liraglutide (VICTOZA) 18 MG/3ML SOPN    Sig: Inject 0.1 mLs (0.6 mg total) into the skin daily with breakfast.    Dispense:  3 mL    Refill:  6    Follow-up: Return in about 6 months (around 06/05/2018) for follow up of chronic medical condition.   Charlott Rakes MD

## 2017-12-04 NOTE — Patient Instructions (Signed)
Diet for Irritable Bowel Syndrome When you have irritable bowel syndrome (IBS), the foods you eat and your eating habits are very important. IBS may cause various symptoms, such as abdominal pain, constipation, or diarrhea. Choosing the right foods can help ease discomfort caused by these symptoms. Work with your health care provider and dietitian to find the best eating plan to help control your symptoms. What general guidelines do I need to follow?  Keep a food diary. This will help you identify foods that cause symptoms. Write down: ? What you eat and when. ? What symptoms you have. ? When symptoms occur in relation to your meals.  Avoid foods that cause symptoms. Talk with your dietitian about other ways to get the same nutrients that are in these foods.  Eat more foods that contain fiber. Take a fiber supplement if directed by your dietitian.  Eat your meals slowly, in a relaxed setting.  Aim to eat 5-6 small meals per day. Do not skip meals.  Drink enough fluids to keep your urine clear or pale yellow.  Ask your health care provider if you should take an over-the-counter probiotic during flare-ups to help restore healthy gut bacteria.  If you have cramping or diarrhea, try making your meals low in fat and high in carbohydrates. Examples of carbohydrates are pasta, rice, whole grain breads and cereals, fruits, and vegetables.  If dairy products cause your symptoms to flare up, try eating less of them. You might be able to handle yogurt better than other dairy products because it contains bacteria that help with digestion. What foods are not recommended? The following are some foods and drinks that may worsen your symptoms:  Fatty foods, such as French fries.  Milk products, such as cheese or ice cream.  Chocolate.  Alcohol.  Products with caffeine, such as coffee.  Carbonated drinks, such as soda.  The items listed above may not be a complete list of foods and beverages to  avoid. Contact your dietitian for more information. What foods are good sources of fiber? Your health care provider or dietitian may recommend that you eat more foods that contain fiber. Fiber can help reduce constipation and other IBS symptoms. Add foods with fiber to your diet a little at a time so that your body can get used to them. Too much fiber at once might cause gas and swelling of your abdomen. The following are some foods that are good sources of fiber:  Apples.  Peaches.  Pears.  Berries.  Figs.  Broccoli (raw).  Cabbage.  Carrots.  Raw peas.  Kidney beans.  Lima beans.  Whole grain bread.  Whole grain cereal.  Where to find more information: International Foundation for Functional Gastrointestinal Disorders: www.iffgd.org National Institute of Diabetes and Digestive and Kidney Diseases: www.niddk.nih.gov/health-information/health-topics/digestive-diseases/ibs/Pages/facts.aspx This information is not intended to replace advice given to you by your health care provider. Make sure you discuss any questions you have with your health care provider. Document Released: 05/05/2003 Document Revised: 07/21/2015 Document Reviewed: 05/15/2013 Elsevier Interactive Patient Education  2018 [employer]  

## 2018-01-14 ENCOUNTER — Ambulatory Visit: Payer: Self-pay | Attending: Family Medicine | Admitting: Family Medicine

## 2018-01-14 ENCOUNTER — Encounter: Payer: Self-pay | Admitting: Family Medicine

## 2018-01-14 VITALS — BP 131/82 | HR 78 | Temp 98.4°F | Ht 66.0 in | Wt 250.0 lb

## 2018-01-14 DIAGNOSIS — Z6841 Body Mass Index (BMI) 40.0 and over, adult: Secondary | ICD-10-CM | POA: Insufficient documentation

## 2018-01-14 DIAGNOSIS — L304 Erythema intertrigo: Secondary | ICD-10-CM | POA: Insufficient documentation

## 2018-01-14 DIAGNOSIS — N939 Abnormal uterine and vaginal bleeding, unspecified: Secondary | ICD-10-CM | POA: Insufficient documentation

## 2018-01-14 DIAGNOSIS — Z79899 Other long term (current) drug therapy: Secondary | ICD-10-CM | POA: Insufficient documentation

## 2018-01-14 DIAGNOSIS — Z794 Long term (current) use of insulin: Secondary | ICD-10-CM | POA: Insufficient documentation

## 2018-01-14 DIAGNOSIS — Z114 Encounter for screening for human immunodeficiency virus [HIV]: Secondary | ICD-10-CM | POA: Insufficient documentation

## 2018-01-14 DIAGNOSIS — E669 Obesity, unspecified: Secondary | ICD-10-CM | POA: Insufficient documentation

## 2018-01-14 DIAGNOSIS — E119 Type 2 diabetes mellitus without complications: Secondary | ICD-10-CM | POA: Insufficient documentation

## 2018-01-14 DIAGNOSIS — F319 Bipolar disorder, unspecified: Secondary | ICD-10-CM | POA: Insufficient documentation

## 2018-01-14 LAB — GLUCOSE, POCT (MANUAL RESULT ENTRY): POC GLUCOSE: 171 mg/dL — AB (ref 70–99)

## 2018-01-14 MED ORDER — CLOTRIMAZOLE 1 % EX CREA: 1.0000 "application " | TOPICAL_CREAM | Freq: Two times a day (BID) | CUTANEOUS | 1 refills | Status: DC

## 2018-01-14 NOTE — Progress Notes (Signed)
Subjective:  [name], female    DOB: [birth year]  Age: 37 y.o. MRN: [id number]  CC: Rash   HPI Colleen Downs is a 37 year old female with a history of type 2 diabetes mellitus (A1c 6.0), obesity, bipolar disorder, abnormal uterine bleed who presents today for a follow-up visit. She complains of a rash around her neck noticed 3 months ago ever since she started working at [employer].  Due to the frequent and heavy lifting she finds herself sweating a lot around her neck and also states dust particles collected around her neck as well and she sometimes forgets to dry out her neck.  Rash is not pruritic and she denies presence of rash in other body parts. She would like to be tested for HIV at a couple of months back she was involved in unprotected sexual intercourse.  Past Medical History:  Diagnosis Date  . Diarrhea   . Irregular periods     Past Surgical History:  Procedure Laterality Date  . CHOLECYSTECTOMY      Allergies  Allergen Reactions  . Sulfa Drugs Cross Reactors Anaphylaxis     Outpatient Medications Prior to Visit  Medication Sig Dispense Refill  . Blood Glucose Monitoring Suppl (TRUE METRIX METER) w/Device KIT Use as directed 1 kit 0  . glucose blood (TRUE METRIX BLOOD GLUCOSE TEST) test strip Use as instructed 100 each 12  . glucose blood test strip USE AS INSTRUCTED  12  . Insulin Pen Needle 31G X 5 MM MISC 1 each by Does not apply route at bedtime. 30 each 5  . liraglutide (VICTOZA) 18 MG/3ML SOPN Inject 0.1 mLs (0.6 mg total) into the skin daily with breakfast. 3 mL 6  . Norgestimate-Ethinyl Estradiol Triphasic (TRI-SPRINTEC) 0.18/0.215/0.25 MG-35 MCG tablet Take 1 tablet by mouth daily. 1 Package 11  . TRUEPLUS LANCETS 28G MISC Use as directed 100 each 1  . promethazine (PHENERGAN) 25 MG tablet Take 1 tablet (25 mg total) by mouth every 6 (six) hours as needed for nausea. 30 tablet 0   No facility-administered medications prior to visit.      ROS Review of Systems  Constitutional: Negative for activity change, appetite change and fatigue.  HENT: Negative for congestion, sinus pressure and sore throat.   Eyes: Negative for visual disturbance.  Respiratory: Negative for cough, chest tightness, shortness of breath and wheezing.   Cardiovascular: Negative for chest pain and palpitations.  Gastrointestinal: Negative for abdominal distention, abdominal pain and constipation.  Endocrine: Negative for polydipsia.  Genitourinary: Negative for dysuria and frequency.  Musculoskeletal: Negative for arthralgias and back pain.  Skin: Negative for rash.  Neurological: Negative for tremors, light-headedness and numbness.  Hematological: Does not bruise/bleed easily.  Psychiatric/Behavioral: Negative for agitation and behavioral problems.    Objective:  BP 131/82   Pulse 78   Temp 98.4 F (36.9 C) (Oral)   Ht '5\' 6"'$  (1.676 m)   Wt 250 lb (113.4 kg)   SpO2 100%   BMI 40.35 kg/m   BP/Weight 01/14/2018 12/04/2017 0/11/9321  Systolic BP 557 322 025  Diastolic BP 82 81 72  Wt. (Lbs) 250 242.8 246.8  BMI 40.35 39.19 39.83      Physical Exam  Constitutional: She is oriented to person, place, and time. She appears well-developed and well-nourished.  Cardiovascular: Normal rate, normal heart sounds and intact distal pulses.  No murmur heard. Pulmonary/Chest: Effort normal and breath sounds normal. She has no wheezes. She has no rales. She exhibits no tenderness.  Abdominal: Soft. Bowel sounds are normal. She exhibits no distension and no mass. There is no tenderness.  Musculoskeletal: Normal range of motion.  Neurological: She is alert and oriented to person, place, and time.  Skin:  Circumferential rash around the neck sparing anterior aspect with slight hypopigmentation  Psychiatric: She has a normal mood and affect.    Lab Results  Component Value Date   HGBA1C 6.0 12/04/2017    Assessment & Plan:   1. New onset type  2 diabetes mellitus (HCC) Controlled Continue current regimen - POCT glucose (manual entry)  2. Screening for HIV (human immunodeficiency virus) - HIV Antibody (routine testing w rflx)  3. Intertrigo Weight loss, keeping as dry as possible and wiping of sweats from her neck could be beneficial - clotrimazole (LOTRIMIN) 1 % cream; Apply 1 application topically 2 (two) times daily.  Dispense: 30 g; Refill: 1   Meds ordered this encounter  Medications  . clotrimazole (LOTRIMIN) 1 % cream    Sig: Apply 1 application topically 2 (two) times daily.    Dispense:  30 g    Refill:  1    Follow-up: Return for Follow-up of chronic medical conditions, keep previously scheduled appointment.   Charlott Rakes MD

## 2018-01-14 NOTE — Progress Notes (Signed)
Patient has rash around neck.

## 2018-01-15 LAB — HIV ANTIBODY (ROUTINE TESTING W REFLEX): HIV SCREEN 4TH GENERATION: NONREACTIVE

## 2018-01-21 ENCOUNTER — Emergency Department (HOSPITAL_BASED_OUTPATIENT_CLINIC_OR_DEPARTMENT_OTHER): Payer: Self-pay

## 2018-01-21 ENCOUNTER — Encounter (HOSPITAL_BASED_OUTPATIENT_CLINIC_OR_DEPARTMENT_OTHER): Payer: Self-pay | Admitting: Emergency Medicine

## 2018-01-21 ENCOUNTER — Other Ambulatory Visit: Payer: Self-pay

## 2018-01-21 ENCOUNTER — Emergency Department (HOSPITAL_BASED_OUTPATIENT_CLINIC_OR_DEPARTMENT_OTHER)
Admission: EM | Admit: 2018-01-21 | Discharge: 2018-01-21 | Disposition: A | Discharge status: Home / Self Care | Payer: Self-pay | Attending: Emergency Medicine | Admitting: Emergency Medicine

## 2018-01-21 DIAGNOSIS — Z87891 Personal history of nicotine dependence: Secondary | ICD-10-CM | POA: Insufficient documentation

## 2018-01-21 DIAGNOSIS — M25562 Pain in left knee: Secondary | ICD-10-CM | POA: Insufficient documentation

## 2018-01-21 DIAGNOSIS — E119 Type 2 diabetes mellitus without complications: Secondary | ICD-10-CM | POA: Insufficient documentation

## 2018-01-21 MED ORDER — IBUPROFEN 800 MG PO TABS
800.0000 mg | ORAL_TABLET | Freq: Once | ORAL | Status: AC
  Administered 2018-01-21: 800 mg via ORAL
  Filled 2018-01-21: qty 1

## 2018-01-21 MED ORDER — NAPROXEN 500 MG PO TABS: 500.0000 mg | ORAL_TABLET | Freq: Two times a day (BID) | ORAL | 0 refills | Status: DC | PRN

## 2018-01-21 NOTE — Discharge Instructions (Signed)

## 2018-01-21 NOTE — ED Triage Notes (Signed)
Pt c/o 7/10 left leg pain that started while at work. Pt denies any injury.

## 2018-01-21 NOTE — ED Notes (Signed)
ED Provider at bedside. 

## 2018-01-21 NOTE — ED Provider Notes (Signed)
Emergency Department Provider Note   I have reviewed the triage vital signs and the nursing notes.   HISTORY  Chief Complaint Leg Pain   HPI Colleen Downs is a 37 y.o. female presents to the ED with left knee and leg pain. Patient noticed symptoms while at work over the last two weeks. No obvious injury but patient's job requires frequent moving and lifting. She notes wearing old work shoes that are no longer very supportive. She is having anterior knee pain with grinding feeling with bending and walking. Also notes a linear, medial, pain radiation from the ankle to the thigh. No posterior leg pain or swelling. No CP or SOB symptoms.    Past Medical History:  Diagnosis Date  . Diarrhea   . Irregular periods     Patient Active Problem List   Diagnosis Date Noted  . Abnormal uterine bleeding 07/29/2017  . Bipolar depression ([place]) 03/19/2017  . Irritable bowel syndrome with both constipation and diarrhea 12/25/2016  . New onset type 2 diabetes mellitus ([place]) 12/25/2016  . Class 3 severe obesity due to excess calories without serious comorbidity with body mass index (BMI) of 40.0 to 44.9 in adult ([place]) 12/25/2016  . Irregular menses 12/25/2016  . Immunization due 12/24/2016    Past Surgical History:  Procedure Laterality Date  . CHOLECYSTECTOMY      Allergies Sulfa drugs cross reactors  Family History  Problem Relation Age of Onset  . Rheum arthritis Mother   . Rheum arthritis Sister   . Arthritis Maternal Uncle   . Arthritis Paternal Uncle   . Diabetes Maternal Grandmother     Social History Social History   Tobacco Use  . Smoking status: Former Research scientist (life sciences)  . Smokeless tobacco: Never Used  Substance Use Topics  . Alcohol use: Yes    Comment: occ  . Drug use: Yes    Types: Marijuana    Review of Systems  Constitutional: No fever/chills Genitourinary: Negative for dysuria. Musculoskeletal: Negative for back pain. Positive left knee/leg pain.  Skin:  Negative for rash. Neurological: Negative for headaches, focal weakness or numbness.  10-point ROS otherwise negative.  ____________________________________________   PHYSICAL EXAM:  VITAL SIGNS: ED Triage Vitals [01/21/18 2042]  Enc Vitals Group     BP 120/70     Pulse Rate 79     Resp 18     Temp 98.6 F (37 C)     Temp Source Oral     SpO2 100 %     Weight 250 lb (113.4 kg)     Height 5\' 6"  (1.676 m)     Pain Score 7   Constitutional: Alert and oriented. Well appearing and in no acute distress. Eyes: Conjunctivae are normal.  Head: Atraumatic. Nose: No congestion/rhinnorhea. Mouth/Throat: Mucous membranes are moist.  Neck: No stridor.   Cardiovascular: Normal rate, regular rhythm.  Respiratory: Normal respiratory effort.  Gastrointestinal: No distention.  Musculoskeletal: Tenderness over the anterior, inferior knee. Normal ROM of the left knee. No patellar tenderness. No joint erythema or warmth. No posterior calf or popliteal tenderness. Normal ROM of the left hip and knee. Patient ambulatory in the ED without apparent difficulty.  Neurologic:  Normal speech and language. No gross focal neurologic deficits are appreciated.  Skin:  Skin is warm, dry and intact. No rash noted.  ____________________________________________  RADIOLOGY  Dg Knee Complete 4 Views Left  Result Date: 01/21/2018 CLINICAL DATA:  Left knee pain for 2 weeks. EXAM: LEFT KNEE - COMPLETE 4+  VIEW COMPARISON:  None. FINDINGS: No evidence of fracture, dislocation, or joint effusion. No evidence of arthropathy or other focal bone abnormality. Soft tissues are unremarkable. IMPRESSION: Negative. Electronically Signed   By: Earle Gell M.D.   On: 01/21/2018 21:25    ____________________________________________   PROCEDURES  Procedure(s) performed:   Procedures  None ____________________________________________   INITIAL IMPRESSION / ASSESSMENT AND PLAN / ED COURSE  Pertinent labs & imaging  results that were available during my care of the patient were reviewed by me and considered in my medical decision making (see chart for details).  Patient with left knee tenderness to palpation. No evidence of joint infection. Tenderness is all anterior and reproducible. No leg swelling. No concern clinically for DVT. Plain film of the knee is negative. Advised new work shoes with increased support, knee sleeve, and NSAIDs. Discussed plan regarding PCP and sports med follow up.   ____________________________________________  FINAL CLINICAL IMPRESSION(S) / ED DIAGNOSES  Final diagnoses:  Acute pain of left knee     MEDICATIONS GIVEN DURING THIS VISIT:  Medications  ibuprofen (ADVIL,MOTRIN) tablet 800 mg (800 mg Oral Given 01/21/18 2055)     NEW OUTPATIENT MEDICATIONS STARTED DURING THIS VISIT:  Discharge Medication List as of 01/21/2018  9:45 PM    START taking these medications   Details  naproxen (NAPROSYN) 500 MG tablet Take 1 tablet (500 mg total) by mouth 2 (two) times daily as needed for moderate pain., Starting Tue 01/21/2018, Print        Note:  This document was prepared using Dragon voice recognition software and may include unintentional dictation errors.  Nanda Quinton, MD Emergency Medicine    Conrad Zajkowski, Wonda Olds, MD 01/22/18 [phone]

## 2018-02-07 ENCOUNTER — Ambulatory Visit (INDEPENDENT_AMBULATORY_CARE_PROVIDER_SITE_OTHER): Payer: Self-pay | Admitting: Family Medicine

## 2018-02-07 ENCOUNTER — Encounter: Payer: Self-pay | Admitting: Family Medicine

## 2018-02-07 VITALS — BP 156/83 | Ht 66.0 in | Wt 245.0 lb

## 2018-02-07 DIAGNOSIS — G8929 Other chronic pain: Secondary | ICD-10-CM

## 2018-02-07 DIAGNOSIS — M25562 Pain in left knee: Secondary | ICD-10-CM

## 2018-02-07 DIAGNOSIS — M545 Low back pain: Secondary | ICD-10-CM

## 2018-02-07 DIAGNOSIS — M25572 Pain in left ankle and joints of left foot: Secondary | ICD-10-CM

## 2018-02-07 NOTE — Progress Notes (Signed)
  Colleen Downs - 37 y.o. female MRN [id number]  Date of birth: Mar 21, 1980    SUBJECTIVE:      Chief Complaint:/ HPI:   Left knee pain for several months, started when she got new job which is "high octane" working as a Theatre manager at [employer]. Does a lot of twisting, turning, stooping. Knee occasionally "crackles", no giving way. No falls. No specific injury. No swelling or unusual warmth opf knee joint.  2. Low back pain, worse on left, 3-4/10. Intermittent, worse with long periods of standing.No radiation into buttocks, legs or feet. No numbness. No bowel or bladder incontinence. Does a lot of rotating from front  To side (swiveling of trunk) in her new job,  3. Left foot pain--top of foot. Hurts almost all of te time but definitely worse after standing or walking for any length of time. This has been ongoing for years, but worse since new job.  ROS:     See HPI. Pertinent review of systems: negative for fever or unusual weight change.   PERTINENT  PMH / PSH FH / / SH:  Past Medical, Surgical, Social, and Family History Reviewed & Updated in the EMR.  Pertinent findings include:  Sometime smoker Positive for  personal hx DM  OBJECTIVE: BP (!) 156/83   Ht 5\' 6"  (1.676 m)   Wt 245 lb (111.1 kg)   BMI 39.54 kg/m   Physical Exam:  Vital signs are reviewed. BACK: no defect. No scoliosis. Stiff musculature in lumbar area as evidenced by inability bring hands within 12 inches of floor on toe touch. No distinct ttp but increased discomfort with resisted rotation to left side. KNEES B symmetrical:No Ligamentously laxity. No joint line tenderness. Popliteal space is soft as is calves. Some discomfort in left knee on deep squat but negative [place]. FEET: severe pes planus with medial foot collapse L>R. TTP over posterior tibialis tendon area but no defect.  [place] knee---no effusion. Slight prominence of medial meniscus but otherwise negative exam.  ASSESSMENT & PLAN:  Knee and low back pain: I think  both of these areas are suffering from new activities of new job. Will rx some HEP quad strengthening, low back straightening. Foot pain: Green insoles with scaphoid pads. F/u 4-6 weeks and or PRN

## 2018-03-29 ENCOUNTER — Emergency Department (HOSPITAL_BASED_OUTPATIENT_CLINIC_OR_DEPARTMENT_OTHER)
Admission: EM | Admit: 2018-03-29 | Discharge: 2018-03-29 | Disposition: A | Discharge status: Home / Self Care | Payer: Self-pay | Attending: Emergency Medicine | Admitting: Emergency Medicine

## 2018-03-29 ENCOUNTER — Encounter (HOSPITAL_BASED_OUTPATIENT_CLINIC_OR_DEPARTMENT_OTHER): Payer: Self-pay | Admitting: *Deleted

## 2018-03-29 ENCOUNTER — Other Ambulatory Visit: Payer: Self-pay

## 2018-03-29 DIAGNOSIS — F32A Depression, unspecified: Secondary | ICD-10-CM

## 2018-03-29 DIAGNOSIS — F330 Major depressive disorder, recurrent, mild: Secondary | ICD-10-CM | POA: Insufficient documentation

## 2018-03-29 DIAGNOSIS — R45851 Suicidal ideations: Secondary | ICD-10-CM | POA: Insufficient documentation

## 2018-03-29 DIAGNOSIS — E119 Type 2 diabetes mellitus without complications: Secondary | ICD-10-CM | POA: Insufficient documentation

## 2018-03-29 DIAGNOSIS — Z87891 Personal history of nicotine dependence: Secondary | ICD-10-CM | POA: Insufficient documentation

## 2018-03-29 DIAGNOSIS — F329 Major depressive disorder, single episode, unspecified: Secondary | ICD-10-CM

## 2018-03-29 DIAGNOSIS — Z7984 Long term (current) use of oral hypoglycemic drugs: Secondary | ICD-10-CM | POA: Insufficient documentation

## 2018-03-29 DIAGNOSIS — Z79899 Other long term (current) drug therapy: Secondary | ICD-10-CM | POA: Insufficient documentation

## 2018-03-29 HISTORY — DX: Type 2 diabetes mellitus without complications: E11.9

## 2018-03-29 LAB — CBC WITH DIFFERENTIAL/PLATELET
Abs Immature Granulocytes: 0.01 10*3/uL (ref 0.00–0.07)
Basophils Absolute: 0 10*3/uL (ref 0.0–0.1)
Basophils Relative: 0 %
EOS ABS: 0.1 10*3/uL (ref 0.0–0.5)
EOS PCT: 1 %
HCT: 42.1 % (ref 36.0–46.0)
Hemoglobin: 13.4 g/dL (ref 12.0–15.0)
Immature Granulocytes: 0 %
Lymphocytes Relative: 41 %
Lymphs Abs: 3.5 10*3/uL (ref 0.7–4.0)
MCH: 29.8 pg (ref 26.0–34.0)
MCHC: 31.8 g/dL (ref 30.0–36.0)
MCV: 93.8 fL (ref 80.0–100.0)
Monocytes Absolute: 0.5 10*3/uL (ref 0.1–1.0)
Monocytes Relative: 6 %
Neutro Abs: 4.4 10*3/uL (ref 1.7–7.7)
Neutrophils Relative %: 52 %
Platelets: 384 10*3/uL (ref 150–400)
RBC: 4.49 MIL/uL (ref 3.87–5.11)
RDW: 13 % (ref 11.5–15.5)
WBC: 8.5 10*3/uL (ref 4.0–10.5)
nRBC: 0 % (ref 0.0–0.2)

## 2018-03-29 LAB — SALICYLATE LEVEL: Salicylate Lvl: <7 mg/dL (ref 2.8–30.0)

## 2018-03-29 LAB — COMPREHENSIVE METABOLIC PANEL
ALT: 25 U/L (ref 0–44)
AST: 22 U/L (ref 15–41)
Albumin: 3.9 g/dL (ref 3.5–5.0)
Alkaline Phosphatase: 36 U/L — ABNORMAL LOW (ref 38–126)
Anion gap: 7 (ref 5–15)
BILIRUBIN TOTAL: 0.6 mg/dL (ref 0.3–1.2)
BUN: 8 mg/dL (ref 6–20)
CALCIUM: 9 mg/dL (ref 8.9–10.3)
CO2: 25 mmol/L (ref 22–32)
CREATININE: 0.81 mg/dL (ref 0.44–1.00)
Chloride: 101 mmol/L (ref 98–111)
GFR calc Af Amer: >60 mL/min (ref >60)
GFR calc non Af Amer: >60 mL/min (ref >60)
Glucose, Bld: 102 mg/dL — ABNORMAL HIGH (ref 70–99)
Potassium: 3.6 mmol/L (ref 3.5–5.1)
Sodium: 133 mmol/L — ABNORMAL LOW (ref 135–145)
Total Protein: 8.1 g/dL (ref 6.5–8.1)

## 2018-03-29 LAB — CBG MONITORING, ED: Glucose-Capillary: 80 mg/dL (ref 70–99)

## 2018-03-29 LAB — ETHANOL: Alcohol, Ethyl (B): <10 mg/dL (ref <10)

## 2018-03-29 LAB — ACETAMINOPHEN LEVEL: Acetaminophen (Tylenol), Serum: <10 ug/mL — ABNORMAL LOW (ref 10–30)

## 2018-03-29 NOTE — BH Assessment (Signed)
[facility] Assessment Progress Note  This Probation officer staffed case with Romilda Garret FNP who also interviewed patient and recommended patient be discharged with resources provided. [place] PA informed of disposition.

## 2018-03-29 NOTE — ED Triage Notes (Signed)
Pt presents with mobil crisis; admits to SI without plan. Cooperative in triage
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
## 2018-03-29 NOTE — ED Notes (Signed)
Staffing called for sitter, advised they could send one at 7p

## 2018-03-29 NOTE — Discharge Instructions (Signed)
Please use the resource guides provided for you today.  You can follow-up with [facility] for continued help with your depression and you can use the shelter resource guide and family services of the [place] to help obtain housing.  If you begin having thoughts of harming yourself or others or any auditory or visual hallucinations, please return to the emergency department for reevaluation.  [facility] [address] [place], [place] [address] [phone] Description: A variety of behavioral health services are offered, including: Open Access  Outpatient Therapy & Psychiatric Services  Assertive Community Treatment Team (ACTT) (adults only)  [place] (children & adults)  Assertive Engagement (children & adults)  Peer Support Services Referrals may be made to the facility-based crisis center 24/7, 365 days a year. Walk-ins are always welcome or you may call ahead to speak with a [facility] staff member regarding availability (preferred method).

## 2018-03-29 NOTE — ED Notes (Signed)
TTS assessment ongoing.

## 2018-03-29 NOTE — ED Notes (Signed)
Pt unable to void at this time. Drinking water, eating mac and cheese
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
## 2018-03-29 NOTE — Progress Notes (Signed)
[name], female   DOB: Sep 01, 1980, 38 y.o.   MRN: [id number]  Pt was seen via tele-psych with TTS, case discussed with treatment team and Dr Dwyane Dee. Pt called mobile crisis and stated she was suicidal with no plan. She was taken to [facility] for evaluation. She denied that she wants to harm herself, she just does not want to live with her family.She is living with her parents. She works full time at [employer]. She has no plan or intent to harm herself. She has no prior suicide attempts. She has no access to weapons.  She has a past history of depression but has not taken medications for this for a long time. She denies homicidal ideation and denies auditory/visual hallucinations. She would like resources for therapy. She stated she just wants someone to talk to. Resources will be faxed to [facility] for outpatient services. Pt is psychiatrically clear.    [place], [place] 03-29-2018        [phone]

## 2018-03-29 NOTE — ED Notes (Signed)
Pt discharged home with taxi voucher.  Reviewed d/c paperwork, including resources for housing.  Pt reports that she does have a safe house to go to tonight and will seek out better housing using resources. Pt denies SI/HI at this time and reports that she will seek help if thoughts change.  Pt ambulatory out of the dept without difficulty.  Taxi arrived and pt was able to get into taxi without difficulty.

## 2018-03-29 NOTE — ED Notes (Addendum)
Pt remains calm and cooperative 

## 2018-04-07 ENCOUNTER — Encounter: Payer: Self-pay | Admitting: Family Medicine

## 2018-04-07 ENCOUNTER — Ambulatory Visit: Payer: Self-pay | Attending: Family Medicine | Admitting: Family Medicine

## 2018-04-07 ENCOUNTER — Telehealth: Payer: Self-pay

## 2018-04-07 VITALS — BP 135/82 | HR 68 | Temp 98.8°F | Resp 18 | Ht 66.0 in | Wt 248.0 lb

## 2018-04-07 DIAGNOSIS — E119 Type 2 diabetes mellitus without complications: Secondary | ICD-10-CM

## 2018-04-07 DIAGNOSIS — R1013 Epigastric pain: Secondary | ICD-10-CM

## 2018-04-07 DIAGNOSIS — F32A Depression, unspecified: Secondary | ICD-10-CM

## 2018-04-07 DIAGNOSIS — F329 Major depressive disorder, single episode, unspecified: Secondary | ICD-10-CM

## 2018-04-07 DIAGNOSIS — G5602 Carpal tunnel syndrome, left upper limb: Secondary | ICD-10-CM

## 2018-04-07 DIAGNOSIS — R002 Palpitations: Secondary | ICD-10-CM

## 2018-04-07 LAB — POCT GLYCOSYLATED HEMOGLOBIN (HGB A1C): Hemoglobin A1C: 6.6 % — AB (ref 4.0–5.6)

## 2018-04-07 LAB — GLUCOSE, POCT (MANUAL RESULT ENTRY): POC Glucose: 150 mg/dL — AB (ref 70–99)

## 2018-04-07 MED ORDER — OMEPRAZOLE 40 MG PO CPDR: 40.0000 mg | DELAYED_RELEASE_CAPSULE | Freq: Every day | ORAL | 3 refills | Status: DC

## 2018-04-07 MED ORDER — METFORMIN HCL 500 MG PO TABS: ORAL_TABLET | ORAL | 1 refills | Status: DC
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
## 2018-04-07 NOTE — Progress Notes (Signed)
Subjective:    [name], female    DOB: 1980-08-28, 38 y.o.   MRN: [id number]  HPI       38 yo female who is seen in follow-up of type 2 diabetes which is controlled with the use of metformin.  Patient is no longer on Victoza.  Patient checks blood sugar infrequently.  Patient states that her blood sugars are generally controlled with fasting blood sugars generally 130s or less.  Patient denies any increased thirst, no urinary frequency or increased hunger.  She denies any numbness or tingling in her feet but does have issues with numbness and tingling in her hands.  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      Patient also has issues with occasional burping and belching/backwash of bad tasting fluid into her throat which generally occurs after she eats certain foods.  Patient states that this might happen once per week.  Patient has had no nausea or vomiting.  Patient denies any abdominal pain.  Patient has had sensation at times of increased heart rate.  Patient denies any chest pain.  No shortness of breath.  Patient does have some fatigue.  Patient is not sure if her increased heart rate sensation is secondary to feeling anxious at times.  Patient is not currently on any medication to help with anxiety.  Patient states that she tends to worry a lot.  Patient is not sure that she would like to take any medication to help with anxiety.  Patient denies any suicidal thoughts or ideations.  Past Medical History:  Diagnosis Date  . Diabetes mellitus without complication ([place])   . Diarrhea   . Irregular periods    Past Surgical History:  Procedure Laterality Date  . CHOLECYSTECTOMY     Family History  Problem Relation Age of Onset  . Rheum arthritis Mother   . Rheum  arthritis Sister   . Arthritis Maternal Uncle   . Arthritis Paternal Uncle   . Diabetes Maternal Grandmother    Social History   Tobacco Use  . Smoking status: Former Research scientist (life sciences)  . Smokeless tobacco: Never Used  Substance Use Topics  . Alcohol use: Yes    Comment: 3x week  . Drug use: Yes    Types: Marijuana   Allergies  Allergen Reactions  . Sulfa Drugs Cross Reactors Anaphylaxis      Review of Systems  Constitutional: Positive for fatigue. Negative for chills and fever.  HENT: Negative for sore throat and trouble swallowing.   Respiratory: Negative for cough and shortness of breath.   Cardiovascular: Positive for palpitations. Negative for chest pain and leg swelling.  Gastrointestinal: Positive for nausea (occasional). Negative for abdominal pain, constipation and diarrhea.  Endocrine: Negative for polydipsia, polyphagia and polyuria.  Genitourinary: Negative for dysuria, flank pain and frequency.  Musculoskeletal: Positive for arthralgias and back pain.  Neurological: Positive for numbness. Negative for dizziness and headaches.  Hematological: Negative for adenopathy. Does not bruise/bleed easily.  Psychiatric/Behavioral: Positive for dysphoric mood. Negative for self-injury and suicidal ideas. The patient is nervous/anxious.        Objective:   Physical Exam Vitals signs and nursing note reviewed.  Constitutional:      Appearance: Normal appearance. She is obese.  Neck:  Musculoskeletal: Normal range of motion and neck supple. No neck rigidity or muscular tenderness.     Vascular: No carotid bruit.  Cardiovascular:     Rate and Rhythm: Normal rate and regular rhythm.     Pulses: Normal pulses.     Heart sounds: Normal heart sounds.  Pulmonary:     Effort: Pulmonary effort is normal. No respiratory distress.     Breath sounds: Normal breath sounds.  Abdominal:     General: Bowel sounds are normal.     Palpations: Abdomen is soft.     Tenderness: There is no  right CVA tenderness, left CVA tenderness, guarding or rebound.  Musculoskeletal: Normal range of motion.        General: Tenderness present. No deformity.     Right lower leg: No edema.     Left lower leg: No edema.     Comments: Joint line tenderness of the knees; mild lumbosacral tenderness  Lymphadenopathy:     Cervical: No cervical adenopathy.  Neurological:     General: No focal deficit present.     Mental Status: She is alert and oriented to person, place, and time.     Cranial Nerves: No cranial nerve deficit.     Motor: No weakness.     Gait: Gait normal.  Psychiatric:        Behavior: Behavior normal.        Thought Content: Thought content normal.        Judgment: Judgment normal.    BP 135/82 (BP Location: Left Arm, Patient Position: Sitting, Cuff Size: Large)   Pulse 68   Temp 98.8 F (37.1 C) (Oral)   Resp 18   Ht 5\' 6"  (1.676 m)   Wt 248 lb (112.5 kg)   LMP 03/27/2018   SpO2 100%   BMI 40.03 kg/m         Assessment & Plan:   1. Type 2 diabetes mellitus without complication, without long-term current use of insulin ([place]) Patient will have labs at today's visit in follow-up of her diabetes including hemoglobin A1c, CBC, CMP and TSH.  Patient's last hemoglobin A1c in October showed very good control of diabetes at 6.0.  Patient is encouraged to continue a low carbohydrate diet, incorporate exercise and continue use of metformin which is refilled at today's visit. - Glucose (CBG) - HgB A1c - metFORMIN (GLUCOPHAGE) 500 MG tablet; Take 1-2 pills as tolerated once daily after the largest meal of the day  Dispense: 180 tablet; Refill: 1 - CBC with Differential - Comprehensive metabolic panel - TSH  2. Carpal tunnel syndrome of left wrist Patient with carpal tunnel syndrome of the left wrist.  Patient may take over-the-counter nonsteroidal anti-inflammatories as needed for wrist pain and patient is encouraged to obtain an over-the-counter wrist brace for both  wrist for nighttime use as patient's job involves a lot of lifting and repetitive motions which increases patient's risk of medial nerve inflammation/carpal tunnel syndrome.  3. Dyspepsia Patient with symptoms of dyspepsia and as patient will be taking nonsteroidal anti-inflammatories to help with carpal tunnel syndrome patient agrees to start omeprazole 40 mg daily.  Patient should also make sure that she eats prior to taking nonsteroidal anti-inflammatories.  Patient is also encouraged to avoid known trigger foods and avoid late night eating. - omeprazole (PRILOSEC) 40 MG capsule; Take 1 capsule (40 mg total) by mouth daily. To decrease stomach acid  Dispense: 30 capsule; Refill: 3  4. Depression, unspecified depression type Patient with  anxiety and admits to some depression.  Patient is not interested in medication for anxiety or depression.  Patient will be referred for social work consultation - Ambulatory referral to Social Work  5. Rapid palpitations Patient with complaint of occasional sensation of increased heart rate.  Patient will have TSH to look for hyperthyroidism, CBC to look for anemia and BMP to look for electrolyte abnormality that may be contributing to her sensation of rapid heart rate.  If patient with continued sensation of increased heart rate, cardiology referral/Holter monitoring may be needed.  Patient should contact the office if her symptoms continue.  Patient will be notified of her blood work and if any further treatment is needed based on these results.  An After Visit Summary was printed and given to the patient.  Return in about 6 weeks (around 05/19/2018).

## 2018-04-08 LAB — CBC WITH DIFFERENTIAL/PLATELET
Basophils Absolute: 0 x10E3/uL (ref 0.0–0.2)
Basos: 0 %
EOS (ABSOLUTE): 0.1 x10E3/uL (ref 0.0–0.4)
Eos: 1 %
Hematocrit: 36.9 % (ref 34.0–46.6)
Hemoglobin: 12.7 g/dL (ref 11.1–15.9)
Immature Grans (Abs): 0 x10E3/uL (ref 0.0–0.1)
Immature Granulocytes: 0 %
Lymphocytes Absolute: 3.1 x10E3/uL (ref 0.7–3.1)
Lymphs: 36 %
MCH: 30.2 pg (ref 26.6–33.0)
MCHC: 34.4 g/dL (ref 31.5–35.7)
MCV: 88 fL (ref 79–97)
Monocytes Absolute: 0.5 x10E3/uL (ref 0.1–0.9)
Monocytes: 6 %
Neutrophils Absolute: 5 x10E3/uL (ref 1.4–7.0)
Neutrophils: 57 %
Platelets: 377 x10E3/uL (ref 150–450)
RBC: 4.2 x10E6/uL (ref 3.77–5.28)
RDW: 13.2 % (ref 11.7–15.4)
WBC: 8.7 x10E3/uL (ref 3.4–10.8)

## 2018-04-08 LAB — COMPREHENSIVE METABOLIC PANEL WITH GFR
ALT: 22 IU/L (ref 0–32)
AST: 16 IU/L (ref 0–40)
Albumin/Globulin Ratio: 1.2 (ref 1.2–2.2)
Albumin: 4.1 g/dL (ref 3.8–4.8)
Alkaline Phosphatase: 40 IU/L (ref 39–117)
BUN/Creatinine Ratio: 15 (ref 9–23)
BUN: 11 mg/dL (ref 6–20)
Bilirubin Total: <0.2 mg/dL (ref 0.0–1.2)
CO2: 22 mmol/L (ref 20–29)
Calcium: 9.4 mg/dL (ref 8.7–10.2)
Chloride: 102 mmol/L (ref 96–106)
Creatinine, Ser: 0.74 mg/dL (ref 0.57–1.00)
GFR calc Af Amer: 120 mL/min/1.73
GFR calc non Af Amer: 104 mL/min/1.73
Globulin, Total: 3.3 g/dL (ref 1.5–4.5)
Glucose: 136 mg/dL — ABNORMAL HIGH (ref 65–99)
Potassium: 4.1 mmol/L (ref 3.5–5.2)
Sodium: 140 mmol/L (ref 134–144)
Total Protein: 7.4 g/dL (ref 6.0–8.5)

## 2018-04-08 LAB — TSH: TSH: 1.26 u[IU]/mL (ref 0.450–4.500)

## 2018-04-08 MED FILL — metFORMIN HCL 500 MG TABS: 500 | 30 days supply | Qty: 60 | Fill #0

## 2018-04-11 ENCOUNTER — Telehealth: Payer: Self-pay | Admitting: *Deleted

## 2018-04-11 NOTE — Telephone Encounter (Signed)
Medical Assistant left message on patient's home and cell voicemail. Voicemail states to give a call back to Brin Ruggerio with [facility] at [phone].  

## 2018-04-11 NOTE — Telephone Encounter (Signed)
-----   Message from Antony Blackbird, MD sent at 04/09/2018 12:40 PM EST ----- Notify patient of normal CBC and CMP normal with non-fasting glucose of 136 and she is known to be diabetic

## 2018-05-02 ENCOUNTER — Telehealth: Payer: Self-pay | Admitting: Licensed Clinical Social Worker

## 2018-05-02 NOTE — Telephone Encounter (Signed)
Call placed to patient to follow up on behavioral health referral. LCSWA offered to meet with pt at upcoming appointment scheduled for 05/05/2018; however, pt requested to change appointment for 05/07/2018. Pt agreed to meet with LCSWA to address behavioral health or resource needs. No additional concerns noted.

## 2018-05-05 ENCOUNTER — Ambulatory Visit: Payer: Self-pay | Admitting: Family Medicine

## 2018-05-07 ENCOUNTER — Ambulatory Visit (HOSPITAL_BASED_OUTPATIENT_CLINIC_OR_DEPARTMENT_OTHER): Payer: Self-pay | Admitting: Licensed Clinical Social Worker

## 2018-05-07 ENCOUNTER — Ambulatory Visit: Payer: Self-pay | Attending: Family Medicine | Admitting: Physician Assistant

## 2018-05-07 ENCOUNTER — Encounter: Payer: Self-pay | Admitting: Family Medicine

## 2018-05-07 ENCOUNTER — Other Ambulatory Visit: Payer: Self-pay

## 2018-05-07 VITALS — BP 114/77 | HR 68 | Temp 98.7°F | Resp 16 | Wt 245.4 lb

## 2018-05-07 DIAGNOSIS — F331 Major depressive disorder, recurrent, moderate: Secondary | ICD-10-CM

## 2018-05-07 DIAGNOSIS — Z59819 Housing instability, housed unspecified: Secondary | ICD-10-CM

## 2018-05-07 DIAGNOSIS — Z599 Problem related to housing and economic circumstances, unspecified: Secondary | ICD-10-CM

## 2018-05-07 DIAGNOSIS — R202 Paresthesia of skin: Secondary | ICD-10-CM

## 2018-05-07 DIAGNOSIS — E119 Type 2 diabetes mellitus without complications: Secondary | ICD-10-CM

## 2018-05-07 LAB — GLUCOSE, POCT (MANUAL RESULT ENTRY): POC Glucose: 91 mg/dl (ref 70–99)

## 2018-05-07 MED ORDER — GABAPENTIN 300 MG PO CAPS: 300.0000 mg | ORAL_CAPSULE | Freq: Every day | ORAL | 3 refills | Status: DC

## 2018-05-07 NOTE — Progress Notes (Signed)
[name], female   DOB: [birth year], 38 y.o.   MRN: [id number]   Colleen Downs, is a 38 y.o. female  VQX:[id number]  [id number]:[id number]  DOB - 11-06-1980  Subjective:  Chief Complaint and HPI: Colleen Downs is a 38 y.o. female here today to  Discuss metformin.  She has never taken it.  She has a lot of questions about taking it.  She is concerned with taking it and drinking alcohol.  She says she drinks 1-2 drinks per week.  Works in Pensions consultant.  Stands on her feet a lot at work.    Also c/o L foot pain and swelling and paresthesias for months now.  Saw podiatrist.     ROS:   Constitutional:  No f/c, No night sweats, No unexplained weight loss. EENT:  No vision changes, No blurry vision, No hearing changes. No mouth, throat, or ear problems.  Respiratory: No cough, No SOB Cardiac: No CP, no palpitations GI:  No abd pain, No N/V/D. GU: No Urinary s/sx Musculoskeletal: foot pain Neuro: No headache, no dizziness, no motor weakness.  Skin: No rash Endocrine:  No polydipsia. No polyuria.  Psych: Denies SI/HI  No problems updated.  ALLERGIES: Allergies  Allergen Reactions  . Sulfa Drugs Cross Reactors Anaphylaxis    PAST MEDICAL HISTORY: Past Medical History:  Diagnosis Date  . Diabetes mellitus without complication ([place])   . Diarrhea   . Irregular periods     MEDICATIONS AT HOME: Prior to Admission medications   Medication Sig Start Date End Date Taking? Authorizing Provider  Blood Glucose Monitoring Suppl (TRUE METRIX METER) w/Device KIT Use as directed 04/03/17   Ladell Pier, MD  clotrimazole (LOTRIMIN) 1 % cream Apply 1 application topically 2 (two) times daily. Patient not taking: Reported on 05/07/2018 01/14/18   Charlott Rakes, MD  gabapentin (NEURONTIN) 300 MG capsule Take 1 capsule (300 mg total) by mouth at bedtime. 05/07/18   Argentina Donovan, PA-C  glucose blood (TRUE METRIX BLOOD GLUCOSE TEST) test strip Use as instructed 12/24/16   Ladell Pier, MD  glucose blood test strip USE AS INSTRUCTED 09/19/17   [provider]  Insulin Pen Needle 31G X 5 MM MISC 1 each by Does not apply route at bedtime. 04/02/17   Charlott Rakes, MD  metFORMIN (GLUCOPHAGE) 500 MG tablet Take 1-2 pills as tolerated once daily after the largest meal of the day Patient not taking: Reported on 05/07/2018 04/07/18   Fulp, Cammie, MD  naproxen (NAPROSYN) 500 MG tablet Take 1 tablet (500 mg total) by mouth 2 (two) times daily as needed for moderate pain. Patient not taking: Reported on 05/07/2018 01/21/18   Long, Wonda Olds, MD  Norgestimate-Ethinyl Estradiol Triphasic (TRI-SPRINTEC) 0.18/0.215/0.25 MG-35 MCG tablet Take 1 tablet by mouth daily. Patient not taking: Reported on 04/07/2018 11/25/17   Woodroe Mode, MD  omeprazole (PRILOSEC) 40 MG capsule Take 1 capsule (40 mg total) by mouth daily. To decrease stomach acid Patient not taking: Reported on 05/07/2018 04/07/18   Antony Blackbird, MD  TRUEPLUS LANCETS 28G MISC Use as directed 12/24/16   Ladell Pier, MD     Objective:  EXAM:   Vitals:   05/07/18 1415  BP: 114/77  Pulse: 68  Resp: 16  Temp: 98.7 F (37.1 C)  TempSrc: Oral  SpO2: 95%  Weight: 245 lb 6.4 oz (111.3 kg)    General appearance : A&OX3. NAD. Non-toxic-appearing HEENT: Atraumatic and Normocephalic.  PERRLA. EOM intact.   Neck:  supple, no JVD. No cervical lymphadenopathy. No thyromegaly Chest/Lungs:  Breathing-non-labored, Good air entry bilaterally, breath sounds normal without rales, rhonchi, or wheezing  CVS: S1 S2 regular, no murmurs, gallops, rubs  B feet examined-no swelling now, pulses =B.  Full S&ROM Extremities: Bilateral Lower Ext shows no edema, both legs are warm to touch with = pulse throughout Neurology:  CN II-XII grossly intact, Non focal.   Psych:  TP linear. J/I WNL. Normal speech. Appropriate eye contact and affect.  Skin:  No Rash  Data Review Lab Results  Component Value Date   HGBA1C 6.6 (A)  04/07/2018   HGBA1C 6.0 12/04/2017   HGBA1C 7.7 (A) 08/08/2017     Assessment & Plan   1. Type 2 diabetes mellitus without complication, without long-term current use of insulin (HCC) Uncontrolled-start metformin-counseled at length on B/R of metformin.  She agrees to start it.  Answered all questions.  Limit alcohol to 1-2 drinks/week.   - Glucose (CBG)  2. Paresthesia -glycemic control imperative.   Follow-up with podiatrist - gabapentin (NEURONTIN) 300 MG capsule; Take 1 capsule (300 mg total) by mouth at bedtime.  Dispense: 30 capsule; Refill: 3     Patient have been counseled extensively about nutrition and exercise  Return in about 3 months (around 08/07/2018) for Dr newlin-DM and paresthesias.  The patient was given clear instructions to go to ER or return to medical center if symptoms don't improve, worsen or new problems develop. The patient verbalized understanding. The patient was told to call to get lab results if they haven't heard anything in the next week.     Freeman Caldron, PA-C [facility], [place]   05/07/2018, 2:34 PM

## 2018-05-07 NOTE — Patient Instructions (Signed)
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
## 2018-05-21 ENCOUNTER — Ambulatory Visit: Payer: Self-pay | Admitting: Family Medicine

## 2018-06-25 ENCOUNTER — Telehealth: Payer: Self-pay | Admitting: Family Medicine

## 2018-06-25 NOTE — Telephone Encounter (Signed)
C/o severe foot pain and swelling due to lots of work hours at [employer].  She also has noticed lesions and has h/o DM Per patient half of toenail is ripping off great toe.  Pt states she has not done any interventions- warm, salt water soaks in clean basin  Needs note for employer- [employer] -   Scheduled appointment for patient at 1:50 on [facility] schedule  for televisit.  Pt stated she would go to the hospital to seek evaluation.   Please advise

## 2018-06-25 NOTE — Telephone Encounter (Signed)
New Message   Pt states she is having sever foot pain and her toe nail looks like its about to fall off. Please f/u

## 2018-06-26 ENCOUNTER — Ambulatory Visit: Payer: Self-pay | Attending: Family Medicine

## 2018-06-26 ENCOUNTER — Other Ambulatory Visit: Payer: Self-pay

## 2018-06-26 NOTE — Telephone Encounter (Signed)
Thanks for obtaining an appointment for her on 4/30.  If symptoms worsen she would need to go to the ED.

## 2018-07-24 ENCOUNTER — Encounter (HOSPITAL_BASED_OUTPATIENT_CLINIC_OR_DEPARTMENT_OTHER): Payer: Self-pay | Admitting: *Deleted

## 2018-07-24 ENCOUNTER — Emergency Department (HOSPITAL_BASED_OUTPATIENT_CLINIC_OR_DEPARTMENT_OTHER)
Admission: EM | Admit: 2018-07-24 | Discharge: 2018-07-24 | Disposition: A | Discharge status: Home / Self Care | Payer: Self-pay | Attending: Emergency Medicine | Admitting: Emergency Medicine

## 2018-07-24 ENCOUNTER — Other Ambulatory Visit: Payer: Self-pay

## 2018-07-24 DIAGNOSIS — Z794 Long term (current) use of insulin: Secondary | ICD-10-CM | POA: Insufficient documentation

## 2018-07-24 DIAGNOSIS — R5383 Other fatigue: Secondary | ICD-10-CM | POA: Insufficient documentation

## 2018-07-24 DIAGNOSIS — Z79899 Other long term (current) drug therapy: Secondary | ICD-10-CM | POA: Insufficient documentation

## 2018-07-24 DIAGNOSIS — E119 Type 2 diabetes mellitus without complications: Secondary | ICD-10-CM

## 2018-07-24 DIAGNOSIS — M791 Myalgia, unspecified site: Secondary | ICD-10-CM

## 2018-07-24 DIAGNOSIS — M7918 Myalgia, other site: Secondary | ICD-10-CM | POA: Insufficient documentation

## 2018-07-24 DIAGNOSIS — T733XXA Exhaustion due to excessive exertion, initial encounter: Secondary | ICD-10-CM

## 2018-07-24 DIAGNOSIS — Z87891 Personal history of nicotine dependence: Secondary | ICD-10-CM | POA: Insufficient documentation

## 2018-07-24 LAB — CBC
HCT: 37.1 % (ref 36.0–46.0)
Hemoglobin: 12 g/dL (ref 12.0–15.0)
MCH: 30.3 pg (ref 26.0–34.0)
MCHC: 32.3 g/dL (ref 30.0–36.0)
MCV: 93.7 fL (ref 80.0–100.0)
Platelets: 343 10*3/uL (ref 150–400)
RBC: 3.96 MIL/uL (ref 3.87–5.11)
RDW: 13.1 % (ref 11.5–15.5)
WBC: 9.5 10*3/uL (ref 4.0–10.5)
nRBC: 0 % (ref 0.0–0.2)

## 2018-07-24 LAB — URINALYSIS, ROUTINE W REFLEX MICROSCOPIC
Bilirubin Urine: NEGATIVE
Glucose, UA: NEGATIVE mg/dL
Hgb urine dipstick: NEGATIVE
Ketones, ur: NEGATIVE mg/dL
Leukocytes,Ua: NEGATIVE
Nitrite: NEGATIVE
Protein, ur: NEGATIVE mg/dL
Specific Gravity, Urine: 1.025 (ref 1.005–1.030)
pH: 7 (ref 5.0–8.0)

## 2018-07-24 LAB — BASIC METABOLIC PANEL
Anion gap: 7 (ref 5–15)
BUN: 7 mg/dL (ref 6–20)
CO2: 24 mmol/L (ref 22–32)
Calcium: 8.9 mg/dL (ref 8.9–10.3)
Chloride: 105 mmol/L (ref 98–111)
Creatinine, Ser: 0.76 mg/dL (ref 0.44–1.00)
GFR calc Af Amer: >60 mL/min (ref >60)
GFR calc non Af Amer: >60 mL/min (ref >60)
Glucose, Bld: 117 mg/dL — ABNORMAL HIGH (ref 70–99)
Potassium: 3.9 mmol/L (ref 3.5–5.1)
Sodium: 136 mmol/L (ref 135–145)

## 2018-07-24 LAB — CK: Total CK: 79 U/L (ref 38–234)

## 2018-07-24 LAB — HCG, SERUM, QUALITATIVE: Preg, Serum: NEGATIVE

## 2018-07-24 MED ORDER — NAPROXEN 500 MG PO TABS: 500.0000 mg | ORAL_TABLET | Freq: Two times a day (BID) | ORAL | 0 refills | Status: DC

## 2018-07-24 MED FILL — NAPROXEN 500 MG TABLET: 500 | 7 days supply | Qty: 14 | Fill #0

## 2018-07-24 NOTE — ED Provider Notes (Signed)
[facility] EMERGENCY DEPARTMENT Provider Note   CSN: [id number] Arrival date & time: 07/24/18  1348    History   Chief Complaint Chief Complaint  Patient presents with  . Generalized Body Aches    HPI Colleen Downs is a 38 y.o. female.     HPI Patient reports that she feels achy all over most of her body.  She feels like her muscles are strained.  She thinks this is because she is working at [employer].  She reports the hours have been really hard.  Been working very long hours without getting any breaks.  He reports initially he was doing all right with it but she thinks now she is just extremely fatigued.  Fever, no chills, no shortness of breath, no chest pain, no cough.  No vomiting or diarrhea.  Patient reports she has not been monitoring her blood sugars.  Has continued to take her diabetic medications.  She reports she has not followed up with her doctor due to no office visits and no time.  She is planning to get scheduled this week.  She denies pain or burning with urination.  She denies sexual activity.  She reports is been a year that she is been celibate.  No vaginal bleeding or discharge.  No suspicion for pregnancy.  Patient also points out that she has had problems with numbness of the bottom of the right foot for a long time.  Reports that she got an orthotic prescribed but it has not seemed to help much.  He is also concerned about the toenail on that foot which seems slightly discolored and to be "lifting up".  She does not have problems with back pain or pinched disks that she knows of.  He reports that has been proposed to her in the past that it was her back that was causing the numbness on the bottom of her foot and is also been proposed that it is the arch of her foot which is flat.  Patient does not suffer from back pain or other neurologic complaints. Past Medical History:  Diagnosis Date  . Diabetes mellitus without complication ([place])   . Diarrhea   .  Irregular periods     Patient Active Problem List   Diagnosis Date Noted  . Abnormal uterine bleeding 07/29/2017  . Bipolar depression ([place]) 03/19/2017  . Irritable bowel syndrome with both constipation and diarrhea 12/25/2016  . New onset type 2 diabetes mellitus ([place]) 12/25/2016  . Class 3 severe obesity due to excess calories without serious comorbidity with body mass index (BMI) of 40.0 to 44.9 in adult ([place]) 12/25/2016  . Irregular menses 12/25/2016  . Immunization due 12/24/2016    Past Surgical History:  Procedure Laterality Date  . CHOLECYSTECTOMY       OB History    Gravida  0   Para  0   Term  0   Preterm  0   AB  0   Living  0     SAB  0   TAB  0   Ectopic  0   Multiple  0   Live Births  0            Home Medications    Prior to Admission medications   Medication Sig Start Date End Date Taking? Authorizing Provider  Blood Glucose Monitoring Suppl (TRUE METRIX METER) w/Device KIT Use as directed 04/03/17   Ladell Pier, MD  clotrimazole (LOTRIMIN) 1 % cream Apply 1 application  topically 2 (two) times daily. Patient not taking: Reported on 05/07/2018 01/14/18   Charlott Rakes, MD  gabapentin (NEURONTIN) 300 MG capsule Take 1 capsule (300 mg total) by mouth at bedtime. 05/07/18   Argentina Donovan, PA-C  glucose blood (TRUE METRIX BLOOD GLUCOSE TEST) test strip Use as instructed 12/24/16   Ladell Pier, MD  glucose blood test strip USE AS INSTRUCTED 09/19/17   [provider]  Insulin Pen Needle 31G X 5 MM MISC 1 each by Does not apply route at bedtime. 04/02/17   Charlott Rakes, MD  metFORMIN (GLUCOPHAGE) 500 MG tablet Take 1-2 pills as tolerated once daily after the largest meal of the day Patient not taking: Reported on 05/07/2018 04/07/18   Fulp, Cammie, MD  naproxen (NAPROSYN) 500 MG tablet Take 1 tablet (500 mg total) by mouth 2 (two) times daily as needed for moderate pain. Patient not taking: Reported on 05/07/2018 01/21/18    Long, Wonda Olds, MD  Norgestimate-Ethinyl Estradiol Triphasic (TRI-SPRINTEC) 0.18/0.215/0.25 MG-35 MCG tablet Take 1 tablet by mouth daily. Patient not taking: Reported on 04/07/2018 11/25/17   Woodroe Mode, MD  omeprazole (PRILOSEC) 40 MG capsule Take 1 capsule (40 mg total) by mouth daily. To decrease stomach acid Patient not taking: Reported on 05/07/2018 04/07/18   Antony Blackbird, MD  TRUEPLUS LANCETS 28G MISC Use as directed 12/24/16   Ladell Pier, MD    Family History Family History  Problem Relation Age of Onset  . Rheum arthritis Mother   . Rheum arthritis Sister   . Arthritis Maternal Uncle   . Arthritis Paternal Uncle   . Diabetes Maternal Grandmother     Social History Social History   Tobacco Use  . Smoking status: Former Research scientist (life sciences)  . Smokeless tobacco: Never Used  Substance Use Topics  . Alcohol use: Yes    Comment: 3x week  . Drug use: Yes    Types: Marijuana     Allergies   Sulfa drugs cross reactors   Review of Systems Review of Systems 10 Systems reviewed and are negative for acute change except as noted in the HPI.  Physical Exam Updated Vital Signs BP 127/81   Pulse 69   Temp 98.7 F (37.1 C) (Oral)   Resp 20   Ht _0  (1.651 m)   Wt 112.9 kg   SpO2 100%   BMI 41.42 kg/m   Physical Exam Constitutional:      Comments: Alert and nontoxic.  Clinically well in appearance.  No respiratory distress.  Morbid obesity.  HENT:     Head: Normocephalic and atraumatic.  Eyes:     Extraocular Movements: Extraocular movements intact.     Conjunctiva/sclera: Conjunctivae normal.  Neck:     Musculoskeletal: Neck supple.  Cardiovascular:     Rate and Rhythm: Normal rate and regular rhythm.  Pulmonary:     Effort: Pulmonary effort is normal.     Breath sounds: Normal breath sounds.  Abdominal:     General: There is no distension.     Palpations: Abdomen is soft.     Tenderness: There is no abdominal tenderness. There is no guarding.   Musculoskeletal:     Comments: Peripheral edema.  The calves are soft and nontender.  Examination of both feet shows him to be symmetric in appearance.  There is no soft tissue injury to the right foot.  She does have a right great toenail that is slightly long and darkening of the nail bed suddenly  so along the medial aspect.  There is no erythema or swelling of the soft tissues.  Generally what this looks like is chronic pressure on the toenail with some chronic growth anomaly of the medial aspect of the nail.  Foot is warm and dry.  No signs of trauma or swelling.  Neurological:     General: No focal deficit present.     Mental Status: She is oriented to person, place, and time.     Coordination: Coordination normal.  Psychiatric:        Mood and Affect: Mood normal.          ED Treatments / Results  Labs (all labs ordered are listed, but only abnormal results are displayed) Labs Reviewed  BASIC METABOLIC PANEL  CBC  URINALYSIS, ROUTINE W REFLEX MICROSCOPIC  HCG, SERUM, QUALITATIVE  CK    EKG None  Radiology No results found.  Procedures Procedures (including critical care time)  Medications Ordered in ED Medications - No data to display   Initial Impression / Assessment and Plan / ED Course  I have reviewed the triage vital signs and the nursing notes.  Pertinent labs & imaging results that were available during my care of the patient were reviewed by me and considered in my medical decision making (see chart for details).       Patient has several complaints.  She believes that her symptoms are coming from the physical strain of working at [employer].  She reports extremely long hours and being on her feet on concrete floors.  Clinically she has a well appearance but she is a type II diabetic with significant obesity.  Will check basic labs and CK to make sure no electrolyte abnormalities are contributing to the situation.  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  No signs of back pain or other neurologic symptoms that would suggest a back etiology.  Also thinks that her toenail that is of concern is likely from repetitive minor trauma from walking.  My recommendation is to follow-up with her PCP and also specifically with podiatry to address some of the issues with her foot.  Labs pending.  If labs within normal limits plan will be for discharge with a follow-up plan.  Final Clinical Impressions(s) / ED Diagnoses   Final diagnoses:  Myalgia  Fatigue due to excessive exertion, initial encounter  Type 2 diabetes mellitus without complication, without long-term current use of insulin [facility])    ED Discharge Orders    None       Charlesetta Shanks, MD 07/24/18 1453

## 2018-07-24 NOTE — Discharge Instructions (Signed)
Rest for the next few days.  Work note provided.  Lab work-up here today without any acute findings.  Make an appointment to follow-up with your doctor.  Return for any new or worse symptoms.  Take the Naprosyn as directed.

## 2018-07-24 NOTE — ED Provider Notes (Signed)
Labs without any significant abnormalities patient be discharged home with work note to rest.  She will follow-up with her regular doctor.  Will return for any new or worse symptoms.   Fredia Sorrow, MD 07/24/18 [phone]

## 2018-07-24 NOTE — ED Triage Notes (Signed)
Body aches. States her work load is tremendous at work and she thinks her body aches are related. She called out of work today.

## 2018-07-28 ENCOUNTER — Telehealth: Payer: Self-pay | Admitting: Family Medicine

## 2018-07-28 NOTE — Telephone Encounter (Signed)
New Message   Pt states she filed a claim for leave from her job and they are needing a medical documentation. Please f/u

## 2018-07-29 NOTE — Telephone Encounter (Signed)
Patient was called and informed to obtain paperwork from employer for PCP to fill out for work leave.

## 2018-08-05 NOTE — Progress Notes (Deleted)
[name], female   DOB: [birth year], 38 y.o.   MRN: [id number]  ED 07/24/2018: Has continued to take her diabetic medications.  She reports she has not followed up with her doctor due to no office visits and no time.  She is planning to get scheduled this week.  She denies pain or burning with urination.  She denies sexual activity.  She reports is been a year that she is been celibate.  No vaginal bleeding or discharge.  No suspicion for pregnancy.  Patient also points out that she has had problems with numbness of the bottom of the right foot for a long time.  Reports that she got an orthotic prescribed but it has not seemed to help much.  He is also concerned about the toenail on that foot which seems slightly discolored and to be "lifting up".  She does not have problems with back pain or pinched disks that she knows of.  He reports that has been proposed to her in the past that it was her back that was causing the numbness on the bottom of her foot and is also been proposed that it is the arch of her foot which is flat.  Patient does not suffer from back pain or other neurologic complaints.  From A/P: Patient has several complaints.  She believes that her symptoms are coming from the physical strain of working at [employer].  She reports extremely long hours and being on her feet on concrete floors.  Clinically she has a well appearance but she is a type II diabetic with significant obesity.  Will check basic labs and CK to make sure no electrolyte abnormalities are contributing to the situation.  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  No signs of back pain or other neurologic symptoms that would suggest a back etiology.  Also thinks that her toenail that is of concern is likely from repetitive minor trauma from walking.  My recommendation is to follow-up with her PCP and also  specifically with podiatry to address some of the issues with her foot.

## 2018-08-06 ENCOUNTER — Inpatient Hospital Stay: Payer: Self-pay

## 2018-08-07 ENCOUNTER — Other Ambulatory Visit: Payer: Self-pay

## 2018-08-07 ENCOUNTER — Ambulatory Visit: Payer: Self-pay | Attending: Family Medicine | Admitting: Physician Assistant

## 2018-08-07 NOTE — Progress Notes (Signed)
[name], female   DOB: Jul 25, 1980, 38 y.o.   MRN: [id number] Virtual Visit via Telephone Note  I connected with [name] on 08/07/18 at  4:10 PM EDT by telephone and verified that I am speaking with the correct person using two identifiers.   I discussed the limitations, risks, security and privacy concerns of performing an evaluation and management service by telephone and the availability of in person appointments. I also discussed with the patient that there may be a patient responsible charge related to this service. The patient expressed understanding and agreed to proceed.  Patient location: My Location:  [place] office Persons on the call:   History of Present Illness: Patient seen in the ED 07/24/2018 for body aches and paresthesias of the bottom of her R foot.  Labs overall unremarkable.  Non-fasting glucose 117.   From ED A/P: Patient has several complaints.  She believes that her symptoms are coming from the physical strain of working at [employer].  She reports extremely long hours and being on her feet on concrete floors.  Clinically she has a well appearance but she is a type II diabetic with significant obesity.  Will check basic labs and CK to make sure no electrolyte abnormalities are contributing to the situation.  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  No signs of back pain or other neurologic symptoms that would suggest a back etiology.  Also thinks that her toenail that is of concern is likely from repetitive minor trauma from walking    Observations/Objective:   Assessment and Plan:   Follow Up Instructions:    I discussed the assessment and treatment plan with the patient. The patient was provided an opportunity to ask questions and all were answered. The patient agreed with the plan and demonstrated an understanding of the  instructions.   The patient was advised to call back or seek an in-person evaluation if the symptoms worsen or if the condition fails to improve as anticipated.  I provided *** minutes of non-face-to-face time during this encounter.   Freeman Caldron, PA-C

## 2018-08-12 ENCOUNTER — Ambulatory Visit: Payer: Self-pay | Admitting: Family Medicine

## 2018-08-15 ENCOUNTER — Ambulatory Visit: Payer: Self-pay | Admitting: Family Medicine

## 2018-09-23 MED FILL — metFORMIN HCL 500 MG TABS: 500 | 30 days supply | Qty: 60 | Fill #1

## 2019-01-02 MED FILL — metFORMIN HCL 500 MG TABS: 500 | 30 days supply | Qty: 60 | Fill #2

## 2019-03-01 ENCOUNTER — Other Ambulatory Visit: Payer: Self-pay

## 2019-03-01 ENCOUNTER — Emergency Department (HOSPITAL_BASED_OUTPATIENT_CLINIC_OR_DEPARTMENT_OTHER)
Admission: EM | Admit: 2019-03-01 | Discharge: 2019-03-01 | Disposition: A | Discharge status: Home / Self Care | Payer: HRSA Program | Attending: Emergency Medicine | Admitting: Emergency Medicine

## 2019-03-01 ENCOUNTER — Encounter (HOSPITAL_BASED_OUTPATIENT_CLINIC_OR_DEPARTMENT_OTHER): Payer: Self-pay

## 2019-03-01 DIAGNOSIS — Z87891 Personal history of nicotine dependence: Secondary | ICD-10-CM | POA: Insufficient documentation

## 2019-03-01 DIAGNOSIS — E119 Type 2 diabetes mellitus without complications: Secondary | ICD-10-CM | POA: Insufficient documentation

## 2019-03-01 DIAGNOSIS — Z79899 Other long term (current) drug therapy: Secondary | ICD-10-CM | POA: Insufficient documentation

## 2019-03-01 DIAGNOSIS — Z20828 Contact with and (suspected) exposure to other viral communicable diseases: Secondary | ICD-10-CM | POA: Diagnosis present

## 2019-03-01 DIAGNOSIS — Z20822 Contact with and (suspected) exposure to covid-19: Secondary | ICD-10-CM

## 2019-03-01 LAB — CBG MONITORING, ED: Glucose-Capillary: 116 mg/dL — ABNORMAL HIGH (ref 70–99)

## 2019-03-01 NOTE — ED Triage Notes (Signed)
Pt reports a pt c/o fatigue, sore throat, diarrhea, exertional ShOB. Pt states she has had a + COVID contact.

## 2019-03-01 NOTE — ED Provider Notes (Signed)
[facility] EMERGENCY DEPARTMENT Provider Note   CSN: [id number] Arrival date & time: 03/01/19  1613     History Chief Complaint  Patient presents with  . COVID symptoms    Colleen Downs is a 39 y.o. female who presents emergency department with chief complaint exposure to Covid.  She is asymptomatic at this time.  Patient states that she went out of town with her niece and there was a another companion who was there ended up testing positive for Covid.  She is very nervous that she might have it.  She has no fevers, chills, nausea, vomiting or cough.  HPI     Past Medical History:  Diagnosis Date  . Diabetes mellitus without complication ([place])   . Diarrhea   . Irregular periods     Patient Active Problem List   Diagnosis Date Noted  . Abnormal uterine bleeding 07/29/2017  . Bipolar depression ([place]) 03/19/2017  . Irritable bowel syndrome with both constipation and diarrhea 12/25/2016  . New onset type 2 diabetes mellitus ([place]) 12/25/2016  . Class 3 severe obesity due to excess calories without serious comorbidity with body mass index (BMI) of 40.0 to 44.9 in adult ([place]) 12/25/2016  . Irregular menses 12/25/2016  . Immunization due 12/24/2016    Past Surgical History:  Procedure Laterality Date  . CHOLECYSTECTOMY       OB History    Gravida  0   Para  0   Term  0   Preterm  0   AB  0   Living  0     SAB  0   TAB  0   Ectopic  0   Multiple  0   Live Births  0           Family History  Problem Relation Age of Onset  . Rheum arthritis Mother   . Rheum arthritis Sister   . Arthritis Maternal Uncle   . Arthritis Paternal Uncle   . Diabetes Maternal Grandmother     Social History   Tobacco Use  . Smoking status: Former Research scientist (life sciences)  . Smokeless tobacco: Never Used  Substance Use Topics  . Alcohol use: Yes    Comment: 3x week  . Drug use: Yes    Types: Marijuana    Home Medications Prior to Admission medications   Medication Sig  Start Date End Date Taking? Authorizing Provider  Blood Glucose Monitoring Suppl (TRUE METRIX METER) w/Device KIT Use as directed 04/03/17   Ladell Pier, MD  clotrimazole (LOTRIMIN) 1 % cream Apply 1 application topically 2 (two) times daily. Patient not taking: Reported on 05/07/2018 01/14/18   Charlott Rakes, MD  gabapentin (NEURONTIN) 300 MG capsule Take 1 capsule (300 mg total) by mouth at bedtime. 05/07/18   Argentina Donovan, PA-C  glucose blood (TRUE METRIX BLOOD GLUCOSE TEST) test strip Use as instructed 12/24/16   Ladell Pier, MD  glucose blood test strip USE AS INSTRUCTED 09/19/17   [provider]  Insulin Pen Needle 31G X 5 MM MISC 1 each by Does not apply route at bedtime. 04/02/17   Charlott Rakes, MD  metFORMIN (GLUCOPHAGE) 500 MG tablet Take 1-2 pills as tolerated once daily after the largest meal of the day Patient not taking: Reported on 05/07/2018 04/07/18   Fulp, Cammie, MD  naproxen (NAPROSYN) 500 MG tablet Take 1 tablet (500 mg total) by mouth 2 (two) times daily as needed for moderate pain. Patient not taking: Reported on 05/07/2018  01/21/18   Long, Wonda Olds, MD  naproxen (NAPROSYN) 500 MG tablet Take 1 tablet (500 mg total) by mouth 2 (two) times daily. 07/24/18   Fredia Sorrow, MD  Norgestimate-Ethinyl Estradiol Triphasic (TRI-SPRINTEC) 0.18/0.215/0.25 MG-35 MCG tablet Take 1 tablet by mouth daily. Patient not taking: Reported on 04/07/2018 11/25/17   Woodroe Mode, MD  omeprazole (PRILOSEC) 40 MG capsule Take 1 capsule (40 mg total) by mouth daily. To decrease stomach acid Patient not taking: Reported on 05/07/2018 04/07/18   Antony Blackbird, MD  TRUEPLUS LANCETS 28G MISC Use as directed 12/24/16   Ladell Pier, MD    Allergies    Sulfa drugs cross reactors  Review of Systems   Review of Systems Ten systems reviewed and are negative for acute change, except as noted in the HPI.   Physical Exam Updated Vital Signs Pulse 61   Temp 98.1 F (36.7  C) (Oral)   Resp 20   Ht 5' 6" (1.676 m)   Wt 111.1 kg   LMP 01/30/2019   BMI 39.54 kg/m   Physical Exam Physical Exam  Nursing note and vitals reviewed. Constitutional: She is oriented to person, place, and time. She appears well-developed and well-nourished. No distress.  HENT:  Head: Normocephalic and atraumatic.  Eyes: Conjunctivae normal and EOM are normal. Pupils are equal, round, and reactive to light. No scleral icterus.  Neck: Normal range of motion.  Cardiovascular: Normal rate, regular rhythm and normal heart sounds.  Exam reveals no gallop and no friction rub.   No murmur heard. Pulmonary/Chest: Effort normal and breath sounds normal. No respiratory distress.  Abdominal: Soft. Bowel sounds are normal. She exhibits no distension and no mass. There is no tenderness. There is no guarding.  Neurological: She is alert and oriented to person, place, and time.  Skin: Skin is warm and dry. She is not diaphoretic.  Psych: Anxious  ED Results / Procedures / Treatments   Labs (all labs ordered are listed, but only abnormal results are displayed) Labs Reviewed  CBG MONITORING, ED - Abnormal; Notable for the following components:      Result Value   Glucose-Capillary 116 (*)    All other components within normal limits    EKG None  Radiology No results found.  Procedures Procedures (including critical care time)  Medications Ordered in ED Medications - No data to display  ED Course  I have reviewed the triage vital signs and the nursing notes.  Pertinent labs & imaging results that were available during my care of the patient were reviewed by me and considered in my medical decision making (see chart for details).    MDM Rules/Calculators/A&P                      Patient here with exposure to coronavirus.  She has no symptoms at this time.  Patient given 48-hour send out Covid test.  Advised to stay home until testing has resulted.  She was appropriate for  discharge at this time. Final Clinical Impression(s) / ED Diagnoses Final diagnoses:  Close exposure to COVID-19 virus    Rx / DC Orders ED Discharge Orders    None       Margarita Mail, PA-C 03/01/19 2357    Wyvonnia Dusky, MD 03/02/19 1246

## 2019-03-03 LAB — NOVEL CORONAVIRUS, NAA (HOSP ORDER, SEND-OUT TO REF LAB; TAT 18-24 HRS): SARS-CoV-2, NAA: NOT DETECTED

## 2019-04-08 IMAGING — DX DG SHOULDER 2+V*R*
3 series · 3 of 3 positions shown · non-contrast
Comparison: None.

CLINICAL DATA: Right shoulder pain for 3 days.  No recent injury.

EXAM:
RIGHT SHOULDER - 2+ VIEW

[shoulder grashey]
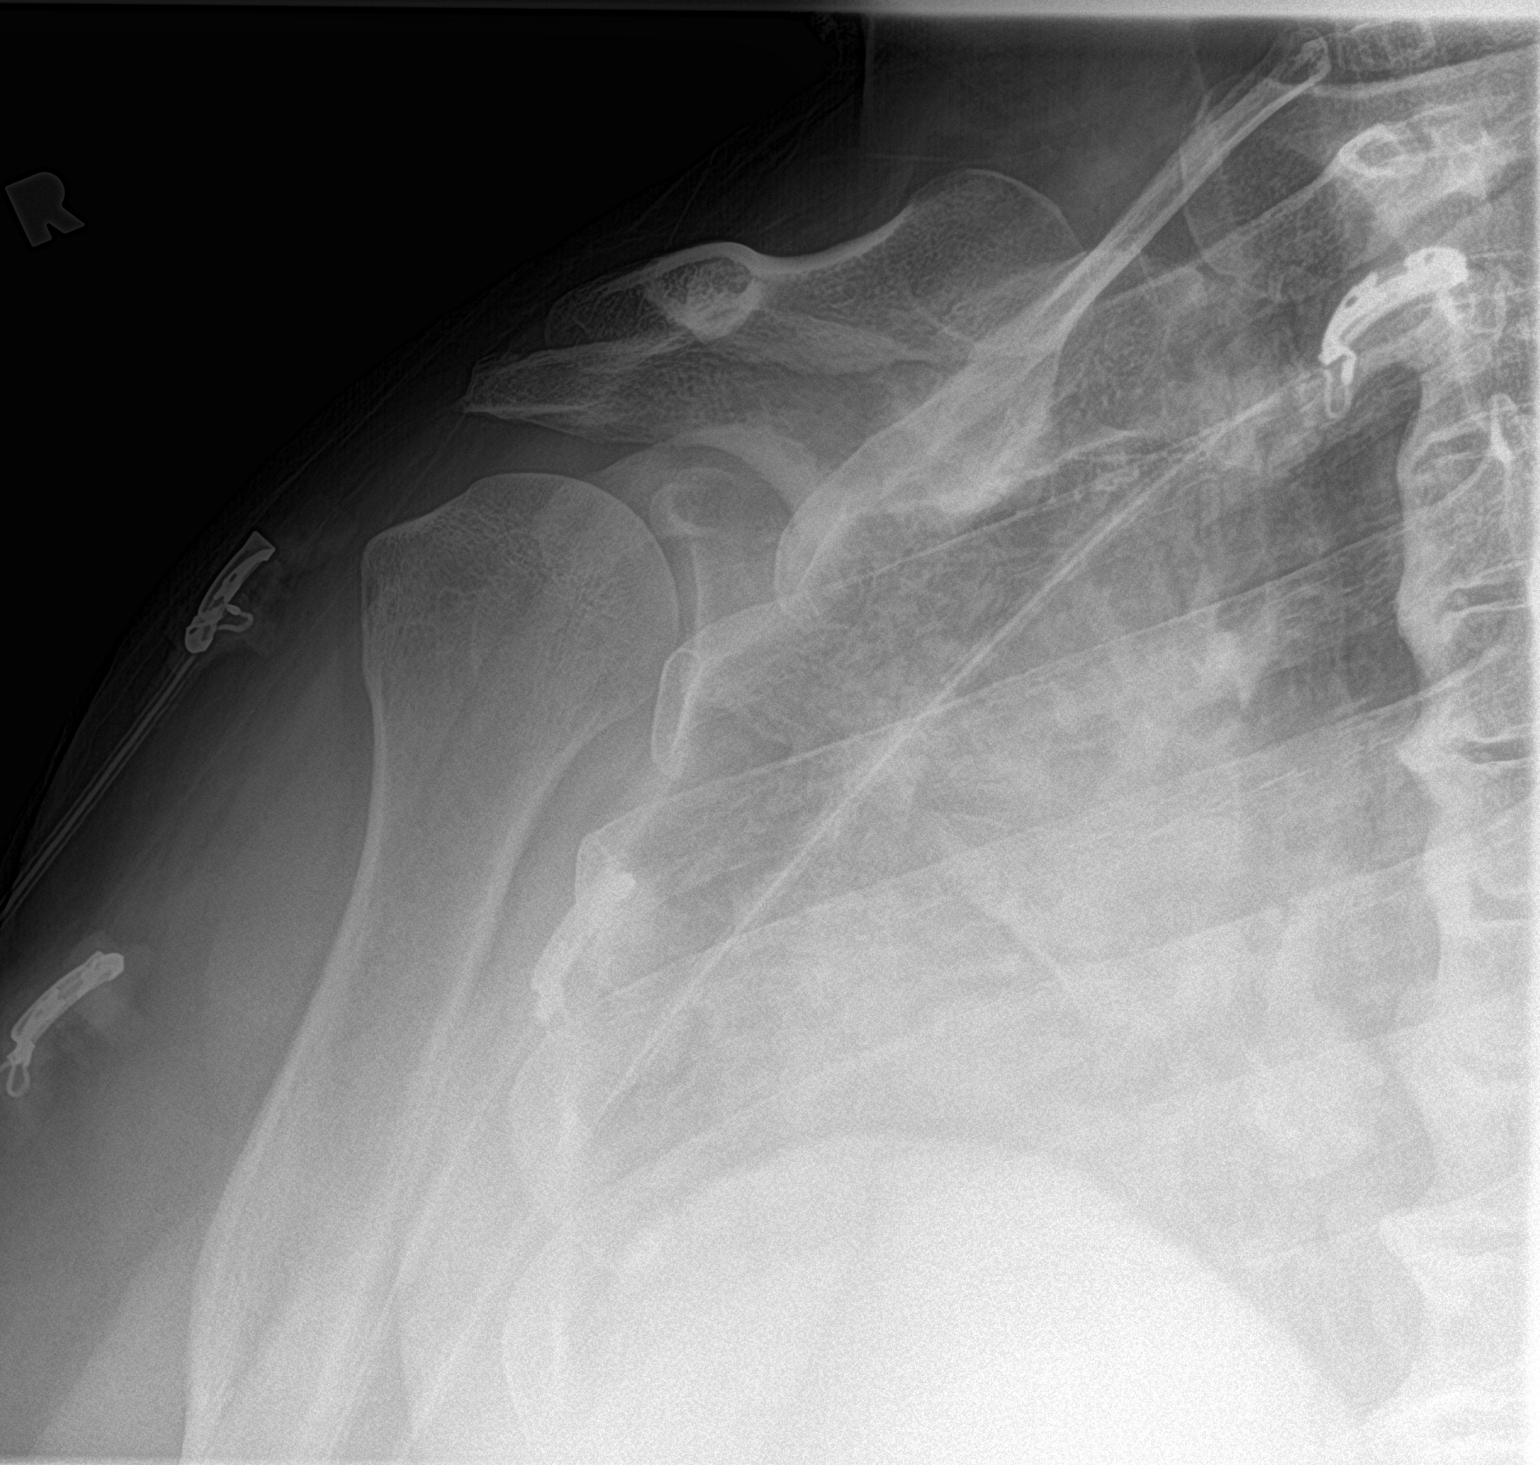

[shoulder axillary]
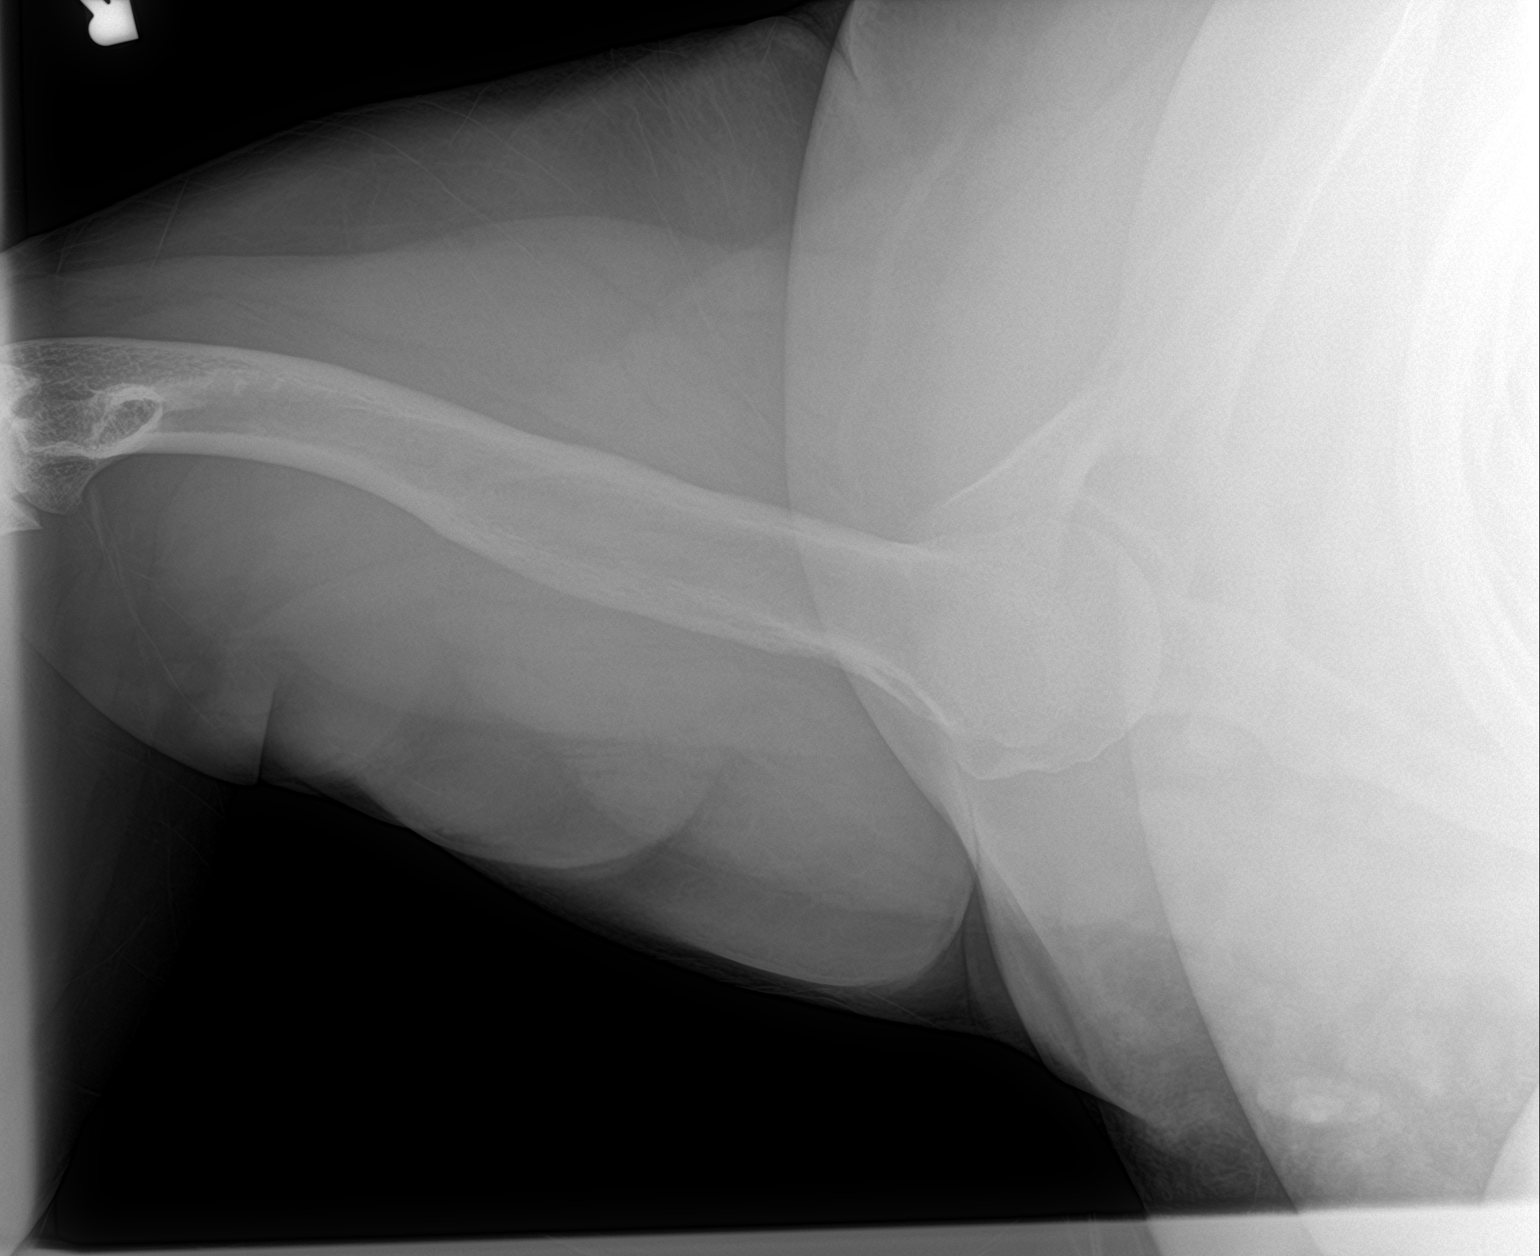

[shoulder y view]
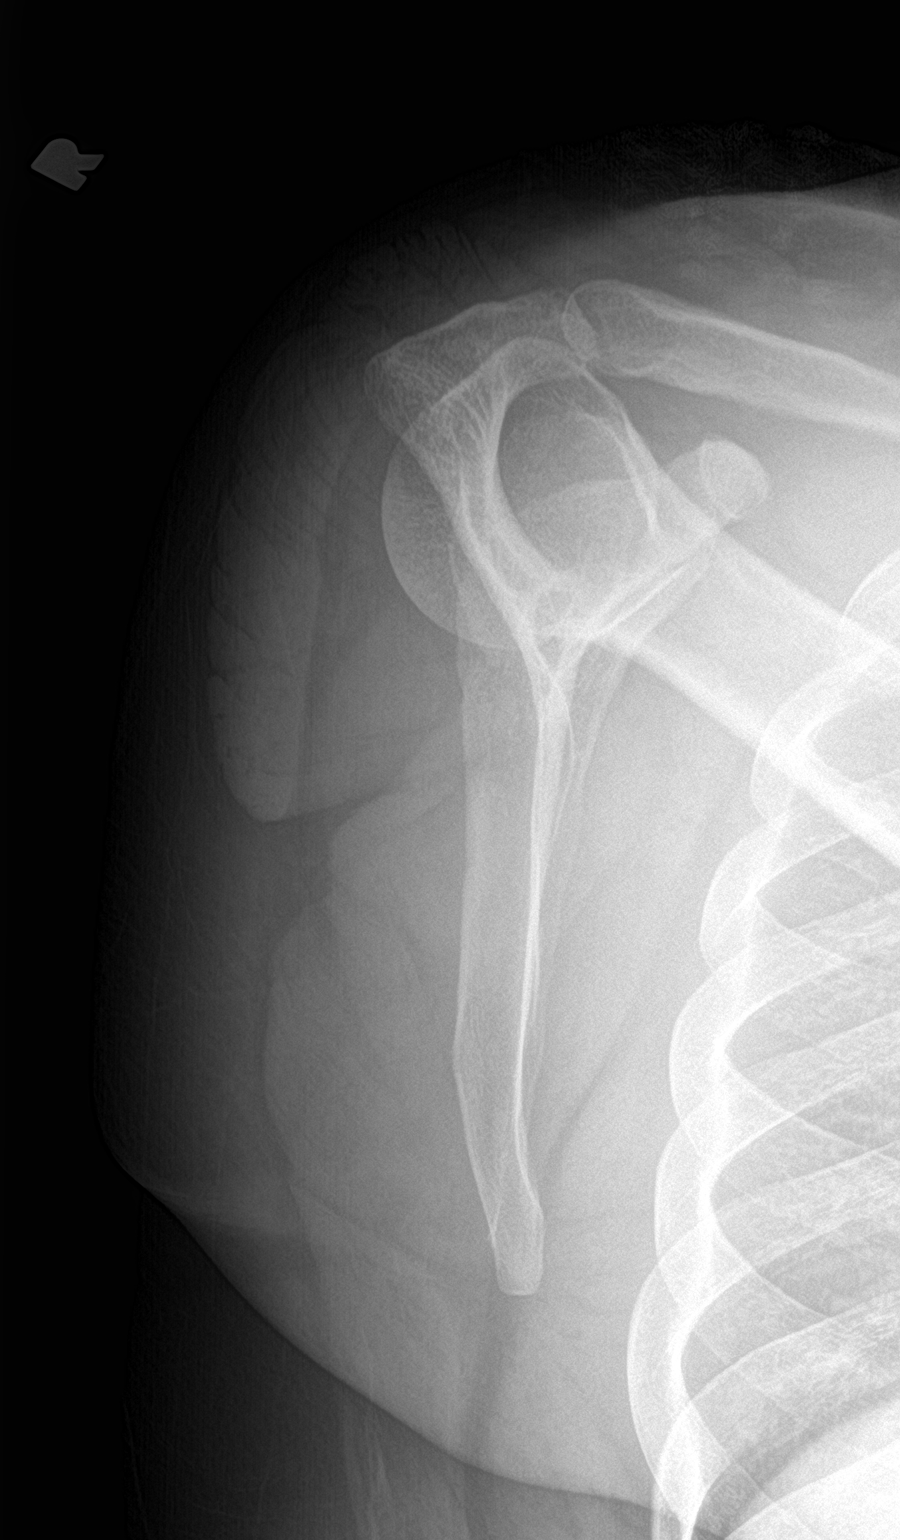

[3 of 3 positions shown; findings below may reference images not displayed]

FINDINGS: There is no evidence of fracture or dislocation. There is no
evidence of arthropathy or other focal bone abnormality. Soft
tissues are unremarkable.
IMPRESSION: Negative.

## 2019-04-20 ENCOUNTER — Other Ambulatory Visit: Payer: Self-pay | Admitting: Family Medicine

## 2019-04-20 DIAGNOSIS — E119 Type 2 diabetes mellitus without complications: Secondary | ICD-10-CM

## 2019-05-08 ENCOUNTER — Other Ambulatory Visit: Payer: Self-pay | Admitting: Internal Medicine

## 2019-05-08 ENCOUNTER — Ambulatory Visit: Payer: Self-pay | Admitting: Family Medicine

## 2019-05-08 ENCOUNTER — Ambulatory Visit: Payer: Self-pay | Attending: Family Medicine | Admitting: Family

## 2019-05-08 ENCOUNTER — Other Ambulatory Visit: Payer: Self-pay

## 2019-05-08 VITALS — BP 127/81 | HR 67 | Ht 65.0 in | Wt 248.6 lb

## 2019-05-08 DIAGNOSIS — E119 Type 2 diabetes mellitus without complications: Secondary | ICD-10-CM

## 2019-05-08 DIAGNOSIS — R202 Paresthesia of skin: Secondary | ICD-10-CM

## 2019-05-08 DIAGNOSIS — Z114 Encounter for screening for human immunodeficiency virus [HIV]: Secondary | ICD-10-CM

## 2019-05-08 MED ORDER — TRUEPLUS LANCETS 28G MISC: 1 refills | Status: DC

## 2019-05-08 MED ORDER — METFORMIN HCL 500 MG PO TABS: ORAL_TABLET | ORAL | 2 refills | Status: DC

## 2019-05-08 MED ORDER — GABAPENTIN 300 MG PO CAPS: 300.0000 mg | ORAL_CAPSULE | Freq: Every day | ORAL | 2 refills | Status: DC

## 2019-05-08 MED ORDER — TRUE METRIX BLOOD GLUCOSE TEST VI STRP: ORAL_STRIP | 12 refills | Status: DC

## 2019-05-08 MED FILL — metFORMIN HCL 500 MG TABS: 500 | 30 days supply | Qty: 30 | Fill #0

## 2019-05-08 MED FILL — GABAPENTIN 300 MG CAPSULE: 300 | 30 days supply | Qty: 30 | Fill #0

## 2019-05-08 MED FILL — !TRUE METRIX BLOOD GLUCOSE: 30 days supply | Qty: 100 | Fill #0

## 2019-05-08 MED FILL — TRUEplus LANCETS 28G MISC: 30 days supply | Qty: 100 | Fill #0

## 2019-05-08 NOTE — Progress Notes (Signed)
[name], female    DOB: Oct 21, 1980  MRN: [id number]  CC: Diabetes Follow-Up  Subjective: Colleen Downs is a 39 y.o. female with history of irritable bowel syndrome with both constipation and diarrhea, diabetes mellitus type 2, abnormal uterine bleeding, irregular menses, and bipolar depression who presents for diabetes follow-up.  1. DIABETES TYPE 2 FOLLOW-UP:  Last A1C: 6.6, February 2020 Results for orders placed or performed during the hospital encounter of 03/01/19  Novel Coronavirus, NAA (Hosp order, Send-out to Ref Lab; TAT 18-24 hrs   Specimen: Nasopharyngeal Swab; Respiratory  Result Value Ref Range   SARS-CoV-2, NAA NOT DETECTED NOT DETECTED   Coronavirus Source NASAL SWAB   CBG monitoring, ED  Result Value Ref Range   Glucose-Capillary 116 (H) 70 - 99 mg/dL    Med Adherence:  [] Yes    [x] No Medication side effects:  [x] Yes, Metformin causes frequent bowel movement Home Monitoring?  [] Yes    [x] No, states that she has a glucose monitor at home but that she needs refills on lancets and strips.  Home glucose results range:None Diet Adherence: [x] Yes, vegetarian Exercise: [x] Yes, began a new workout routine 2 months ago. Working out almost everyday. Hypoglycemic episodes?: [] Yes    [x] No Numbness of the feet? [x] Yes, right foot always and left foot sometimes. Described as dull pain. Retinopathy hx? [] Yes    [x] No Last eye exam: Not sure Comments: Has not taken Metformin for 3 weeks because she ran out and needed an appointment for refills. Typically takes Metformin with evening meal. Reports that lately she feels tired and moody especially since running out of Metformin. Socially drink alcohol occasionally. Reports stopped smoking marijuana 30 days ago.  Last visit March 2020 with physician assistant Colleen Downs during that encounter Metformin was continued for diabetes management and gabapentin was added for management of paresthesias.  Reports that she  never filled prescription for gabapentin as she was unsure to why the medication was prescribed.    2. HIV Screening:  Reports that she would like testing for HIV. Reports she typically gets testing yearly and likes to stay up-to-date with her status.  Intercourse last June 2020 without use of barrier protection.  Denies intercourse since then. Denies vaginal discharge, vaginal itching, burning with urination, and pelvic pain.   Patient Active Problem List   Diagnosis Date Noted  . Abnormal uterine bleeding 07/29/2017  . Bipolar depression ([place]) 03/19/2017  . Irritable bowel syndrome with both constipation and diarrhea 12/25/2016  . New onset type 2 diabetes mellitus ([place]) 12/25/2016  . Class 3 severe obesity due to excess calories without serious comorbidity with body mass index (BMI) of 40.0 to 44.9 in adult ([place]) 12/25/2016  . Irregular menses 12/25/2016  . Immunization due 12/24/2016     Current Outpatient Medications on File Prior to Visit  Medication Sig Dispense Refill  . Blood Glucose Monitoring Suppl (TRUE METRIX METER) w/Device KIT Use as directed 1 kit 0  . clotrimazole (LOTRIMIN) 1 % cream Apply 1 application topically 2 (two) times daily. (Patient not taking: Reported on 05/07/2018) 30 g 1  . gabapentin (NEURONTIN) 300 MG capsule Take 1 capsule (300 mg total) by mouth at bedtime. 30 capsule 3  . glucose blood (TRUE METRIX BLOOD GLUCOSE TEST) test strip Use as instructed 100 each 12  . glucose blood test strip USE AS INSTRUCTED  12  . Insulin Pen Needle 31G X 5 MM MISC  1 each by Does not apply route at bedtime. 30 each 5  . metFORMIN (GLUCOPHAGE) 500 MG tablet Take 1-2 pills as tolerated once daily after the largest meal of the day (Patient not taking: Reported on 05/07/2018) 180 tablet 1  . naproxen (NAPROSYN) 500 MG tablet Take 1 tablet (500 mg total) by mouth 2 (two) times daily as needed for moderate pain. (Patient not taking: Reported on 05/07/2018) 30 tablet 0  . naproxen  (NAPROSYN) 500 MG tablet Take 1 tablet (500 mg total) by mouth 2 (two) times daily. 14 tablet 0  . Norgestimate-Ethinyl Estradiol Triphasic (TRI-SPRINTEC) 0.18/0.215/0.25 MG-35 MCG tablet Take 1 tablet by mouth daily. (Patient not taking: Reported on 04/07/2018) 1 Package 11  . omeprazole (PRILOSEC) 40 MG capsule Take 1 capsule (40 mg total) by mouth daily. To decrease stomach acid (Patient not taking: Reported on 05/07/2018) 30 capsule 3  . TRUEPLUS LANCETS 28G MISC Use as directed 100 each 1   No current facility-administered medications on file prior to visit.    Allergies  Allergen Reactions  . Sulfa Drugs Cross Reactors Anaphylaxis    Social History   Socioeconomic History  . Marital status: Single    Spouse name: Not on file  . Number of children: Not on file  . Years of education: Not on file  . Highest education level: Not on file  Occupational History  . Not on file  Tobacco Use  . Smoking status: Former Smoker  . Smokeless tobacco: Never Used  Substance and Sexual Activity  . Alcohol use: Yes    Comment: 3x week  . Drug use: Yes    Types: Marijuana  . Sexual activity: Not Currently    Birth control/protection: None  Other Topics Concern  . Not on file  Social History Narrative  . Not on file   Social Determinants of Health   Financial Resource Strain:   . Difficulty of Paying Living Expenses:   Food Insecurity:   . Worried About Running Out of Food in the Last Year:   . Ran Out of Food in the Last Year:   Transportation Needs:   . Lack of Transportation (Medical):   . Lack of Transportation (Non-Medical):   Physical Activity:   . Days of Exercise per Week:   . Minutes of Exercise per Session:   Stress:   . Feeling of Stress :   Social Connections:   . Frequency of Communication with Friends and Family:   . Frequency of Social Gatherings with Friends and Family:   . Attends Religious Services:   . Active Member of Clubs or Organizations:   . Attends  Club or Organization Meetings:   . Marital Status:   Intimate Partner Violence:   . Fear of Current or Ex-Partner:   . Emotionally Abused:   . Physically Abused:   . Sexually Abused:     Family History  Problem Relation Age of Onset  . Rheum arthritis Mother   . Rheum arthritis Sister   . Arthritis Maternal Uncle   . Arthritis Paternal Uncle   . Diabetes Maternal Grandmother     Past Surgical History:  Procedure Laterality Date  . CHOLECYSTECTOMY      ROS: Review of Systems Negative except as stated above  PHYSICAL EXAM: Vitals with BMI 05/08/2019 03/01/2019 03/01/2019  Height 5' 5" - 5' 6"  Weight 248 lbs 10 oz - 245 lbs  BMI 41.37 - 39.56  Systolic 127 148 -  Diastolic 81 78 -    Pulse 67 64 -    Physical Exam General appearance - alert, well appearing, and in no distress and oriented to person, place, and time Mental status - alert, oriented to person, place, and time, normal mood, behavior, speech, dress, motor activity, and thought processes Eyes - pupils equal and reactive, extraocular eye movements intact Chest - clear to auscultation, no wheezes, rales or rhonchi, symmetric air entry, no tachypnea, retractions or cyanosis Heart - normal rate, regular rhythm, normal S1, S2, no murmurs, rubs, clicks or gallops Neurological - alert, oriented, normal speech, no focal findings or movement disorder noted, cranial nerves II through XII intact, funduscopic exam normal, discs flat and sharp, DTR's normal and symmetric, normal muscle tone, no tremors, strength 5/5, sensory decreased on lateral aspect of dorsum of right foot Extremities - peripheral pulses normal, no clubbing or cyanosis, pedal edema 1 + bilateral feet  CMP Latest Ref Rng & Units 07/24/2018 04/07/2018 03/29/2018  Glucose 70 - 99 mg/dL 117(H) 136(H) 102(H)  BUN 6 - 20 mg/dL 7 11 8  Creatinine 0.44 - 1.00 mg/dL 0.76 0.74 0.81  Sodium 135 - 145 mmol/L 136 140 133(L)  Potassium 3.5 - 5.1 mmol/L 3.9 4.1 3.6  Chloride  98 - 111 mmol/L 105 102 101  CO2 22 - 32 mmol/L 24 22 25  Calcium 8.9 - 10.3 mg/dL 8.9 9.4 9.0  Total Protein 6.0 - 8.5 g/dL - 7.4 8.1  Total Bilirubin 0.0 - 1.2 mg/dL - <0.2 0.6  Alkaline Phos 39 - 117 IU/L - 40 36(L)  AST 0 - 40 IU/L - 16 22  ALT 0 - 32 IU/L - 22 25   Lipid Panel  No results found for: CHOL, TRIG, HDL, CHOLHDL, VLDL, LDLCALC, LDLDIRECT  CBC    Component Value Date/Time   WBC 9.5 07/24/2018 1456   RBC 3.96 07/24/2018 1456   HGB 12.0 07/24/2018 1456   HGB 12.7 04/07/2018 1719   HCT 37.1 07/24/2018 1456   HCT 36.9 04/07/2018 1719   PLT 343 07/24/2018 1456   PLT 377 04/07/2018 1719   MCV 93.7 07/24/2018 1456   MCV 88 04/07/2018 1719   MCH 30.3 07/24/2018 1456   MCHC 32.3 07/24/2018 1456   RDW 13.1 07/24/2018 1456   RDW 13.2 04/07/2018 1719   LYMPHSABS 3.1 04/07/2018 1719   MONOABS 0.5 03/29/2018 1512   EOSABS 0.1 04/07/2018 1719   BASOSABS 0.0 04/07/2018 1719    ASSESSMENT AND PLAN: 1. Type 2 diabetes mellitus without complication, without long-term current use of insulin (HCC): - Hemoglobin A1c - CBC With Differential - CMP14+EGFR - TSH - metFORMIN (GLUCOPHAGE) 500 MG tablet; Take 1 pill as tolerated once daily after the largest meal of the day  Dispense: 30 tablet; Refill: 2 - gabapentin (NEURONTIN) 300 MG capsule; Take 1 capsule (300 mg total) by mouth at bedtime.  Dispense: 30 capsule; Refill: 2 - glucose blood (TRUE METRIX BLOOD GLUCOSE TEST) test strip; Use as instructed  Dispense: 100 each; Refill: 12 - TRUEplus Lancets 28G MISC; Use as directed  Dispense: 100 each; Refill: 1 -We will check labs today and depending on results we will update patient if medication adjustment Metformin is needed. -Discussed the importance of healthy eating habits, low-carbohydrate diet, low-sugar diet, regular aerobic exercise (at least 150 minutes a week as tolerated) and medication compliance to achieve or maintain control of diabetes.  -To achieve an A1C goal of  less than or equal to 7.0 percent, a fasting blood sugar of 80 to 130   mg/dL and a postprandial glucose (90 to 120 minutes after a meal) less than 180 mg/dL. In the event of sugars less than 60 mg/dl or greater than 400 mg/dl please notify the clinic ASAP. It is recommended that you undergo annual eye exams and annual foot exams. -Offered application for [facility] financial discount/orange card  -Keep appointment with primary provider scheduled for 06/05/2019 . -Follow a Healthy Eating Plan - You can do it! . Limit sugary drinks.  Avoid sodas, sweet tea, sport or energy drinks, or fruit drinks.  Drink water, lo-fat milk, or diet drinks. . Limit snack foods.   Cut back on candy, cake, cookies, chips, ice cream.  These are a special treat, only in small amounts. . Eat plenty of vegetables.  Especially dark green, red, and orange vegetables. Aim for at least 3 servings a day. More is better! Include fruit in your daily diet.  Whole fruit is much healthier than fruit juice! . Limit "white" bread, "white" pasta, "white" rice.   Choose "100% whole grain" products, brown or wild rice. . Avoid fatty meats. Try "Meatless Monday" and choose eggs or beans one day a week.  When eating meat, choose lean meats like chicken, turkey, and fish.  Grill, broil, or bake meats instead of frying, and eat poultry without the skin. . Eat less salt.  Avoid frozen pizzas, frozen dinners and salty foods.  Use seasonings other than salt in cooking.  This can help blood pressure and keep you from swelling . Beer, wine and liquor have calories.  If you can safely drink alcohol, limit to 1 drink per day for women, 2 drinks for men  2. Paresthesia: - gabapentin (NEURONTIN) 300 MG capsule; Take 1 capsule (300 mg total) by mouth at bedtime.  Dispense: 30 capsule; Refill: 2 -Give trial of Gabapentin to see if it assists with nerve pain of bilateral feet  3. Encounter for screening for HIV: - HIV Antibody (routine testing w  rflx)  Patient was given the opportunity to ask questions.  Patient verbalized understanding of the plan and was able to repeat key elements of the plan. Patient was given clear instructions to go to Emergency Department or return to medical center if symptoms don't improve, worsen, or new problems develop.The patient verbalized understanding.    Requested Prescriptions    No prescriptions requested or ordered in this encounter    Amy J Stephens, NP  

## 2019-05-08 NOTE — Patient Instructions (Addendum)
Labs collected today. Metformin and Gabapentin refilled. Apply for [facility] financial discount. Keep appointment scheduled for 05/27/19 with attending physician. Diabetes Basics  Diabetes (diabetes mellitus) is a long-term (chronic) disease. It occurs when the body does not properly use sugar (glucose) that is released from food after you eat. Diabetes may be caused by one or both of these problems:  Your pancreas does not make enough of a hormone called insulin.  Your body does not react in a normal way to insulin that it makes. Insulin lets sugars (glucose) go into cells in your body. This gives you energy. If you have diabetes, sugars cannot get into cells. This causes high blood sugar (hyperglycemia). Follow these instructions at home: How is diabetes treated? You may need to take insulin or other diabetes medicines daily to keep your blood sugar in balance. Take your diabetes medicines every day as told by your doctor. List your diabetes medicines here: Diabetes medicines  Name of medicine: ______________________________ ? Amount (dose): _______________ Time (a.m./p.m.): _______________ Notes: ___________________________________  Name of medicine: ______________________________ ? Amount (dose): _______________ Time (a.m./p.m.): _______________ Notes: ___________________________________  Name of medicine: ______________________________ ? Amount (dose): _______________ Time (a.m./p.m.): _______________ Notes: ___________________________________ If you use insulin, you will learn how to give yourself insulin by injection. You may need to adjust the amount based on the food that you eat. List the types of insulin you use here: Insulin  Insulin type: ______________________________ ? Amount (dose): _______________ Time (a.m./p.m.): _______________ Notes: ___________________________________  Insulin type: ______________________________ ? Amount (dose): _______________ Time (a.m./p.m.):  _______________ Notes: ___________________________________  Insulin type: ______________________________ ? Amount (dose): _______________ Time (a.m./p.m.): _______________ Notes: ___________________________________  Insulin type: ______________________________ ? Amount (dose): _______________ Time (a.m./p.m.): _______________ Notes: ___________________________________  Insulin type: ______________________________ ? Amount (dose): _______________ Time (a.m./p.m.): _______________ Notes: ___________________________________ How do I manage my blood sugar?  Check your blood sugar levels using a blood glucose monitor as directed by your doctor. Your doctor will set treatment goals for you. Generally, you should have these blood sugar levels:  Before meals (preprandial): 80-130 mg/dL (4.4-7.2 mmol/L).  After meals (postprandial): below 180 mg/dL (10 mmol/L).  A1c level: less than 7%. Write down the times that you will check your blood sugar levels: Blood sugar checks  Time: _______________ Notes: ___________________________________  Time: _______________ Notes: ___________________________________  Time: _______________ Notes: ___________________________________  Time: _______________ Notes: ___________________________________  Time: _______________ Notes: ___________________________________  Time: _______________ Notes: ___________________________________  What do I need to know about low blood sugar? Low blood sugar is called hypoglycemia. This is when blood sugar is at or below 70 mg/dL (3.9 mmol/L). Symptoms may include:  Feeling: ? Hungry. ? Worried or nervous (anxious). ? Sweaty and clammy. ? Confused. ? Dizzy. ? Sleepy. ? Sick to your stomach (nauseous).  Having: ? A fast heartbeat. ? A headache. ? A change in your vision. ? Tingling or no feeling (numbness) around the mouth, lips, or tongue. ? Jerky movements that you cannot control (seizure).  Having trouble  with: ? Moving (coordination). ? Sleeping. ? Passing out (fainting). ? Getting upset easily (irritability). Treating low blood sugar To treat low blood sugar, eat or drink something sugary right away. If you can think clearly and swallow safely, follow the 15:15 rule:  Take 15 grams of a fast-acting carb (carbohydrate). Talk with your doctor about how much you should take.  Some fast-acting carbs are: ? Sugar tablets (glucose pills). Take 3-4 glucose pills. ? 6-8 pieces of hard candy. ? 4-6 oz (120-150 mL) of  fruit juice. ? 4-6 oz (120-150 mL) of regular (not diet) soda. ? 1 Tbsp (15 mL) honey or sugar.  Check your blood sugar 15 minutes after you take the carb.  If your blood sugar is still at or below 70 mg/dL (3.9 mmol/L), take 15 grams of a carb again.  If your blood sugar does not go above 70 mg/dL (3.9 mmol/L) after 3 tries, get help right away.  After your blood sugar goes back to normal, eat a meal or a snack within 1 hour. Treating very low blood sugar If your blood sugar is at or below 54 mg/dL (3 mmol/L), you have very low blood sugar (severe hypoglycemia). This is an emergency. Do not wait to see if the symptoms will go away. Get medical help right away. Call your local emergency services (911 in the [place] Do not drive yourself to the hospital. Questions to ask your health care provider  Do I need to meet with a diabetes educator?  What equipment will I need to care for myself at home?  What diabetes medicines do I need? When should I take them?  How often do I need to check my blood sugar?  What number can I call if I have questions?  When is my next doctor's visit?  Where can I find a support group for people with diabetes? Where to find more information  American Diabetes Association: www.diabetes.org  American Association of Diabetes Educators: www.diabeteseducator.org/patient-resources Contact a doctor if:  Your blood sugar is at or above 240 mg/dL  (13.3 mmol/L) for 2 days in a row.  You have been sick or have had a fever for 2 days or more, and you are not getting better.  You have any of these problems for more than 6 hours: ? You cannot eat or drink. ? You feel sick to your stomach (nauseous). ? You throw up (vomit). ? You have watery poop (diarrhea). Get help right away if:  Your blood sugar is lower than 54 mg/dL (3 mmol/L).  You get confused.  You have trouble: ? Thinking clearly. ? Breathing. Summary  Diabetes (diabetes mellitus) is a long-term (chronic) disease. It occurs when the body does not properly use sugar (glucose) that is released from food after digestion.  Take insulin and diabetes medicines as told.  Check your blood sugar every day, as often as told.  Keep all follow-up visits as told by your doctor. This is important. This information is not intended to replace advice given to you by your health care provider. Make sure you discuss any questions you have with your health care provider. Document Revised: 11/05/2018 Document Reviewed: 05/17/2017 Elsevier Patient Education  [place].

## 2019-05-09 LAB — CMP14+EGFR
ALT: 22 IU/L (ref 0–32)
AST: 21 IU/L (ref 0–40)
Albumin/Globulin Ratio: 1.4 (ref 1.2–2.2)
Albumin: 4.1 g/dL (ref 3.8–4.8)
Alkaline Phosphatase: 37 IU/L — ABNORMAL LOW (ref 39–117)
BUN/Creatinine Ratio: 6 — ABNORMAL LOW (ref 9–23)
BUN: 5 mg/dL — ABNORMAL LOW (ref 6–20)
Bilirubin Total: 0.2 mg/dL (ref 0.0–1.2)
CO2: 21 mmol/L (ref 20–29)
Calcium: 9.6 mg/dL (ref 8.7–10.2)
Chloride: 102 mmol/L (ref 96–106)
Creatinine, Ser: 0.77 mg/dL (ref 0.57–1.00)
GFR calc Af Amer: 113 mL/min/{1.73_m2} (ref >59)
GFR calc non Af Amer: 98 mL/min/{1.73_m2} (ref >59)
Globulin, Total: 3 g/dL (ref 1.5–4.5)
Glucose: 108 mg/dL — ABNORMAL HIGH (ref 65–99)
Potassium: 4.5 mmol/L (ref 3.5–5.2)
Sodium: 138 mmol/L (ref 134–144)
Total Protein: 7.1 g/dL (ref 6.0–8.5)

## 2019-05-09 LAB — CBC WITH DIFFERENTIAL
Basophils Absolute: 0 10*3/uL (ref 0.0–0.2)
Basos: 0 %
EOS (ABSOLUTE): 0.1 10*3/uL (ref 0.0–0.4)
Eos: 1 %
Hematocrit: 36.4 % (ref 34.0–46.6)
Hemoglobin: 12.6 g/dL (ref 11.1–15.9)
Immature Grans (Abs): 0 10*3/uL (ref 0.0–0.1)
Immature Granulocytes: 0 %
Lymphocytes Absolute: 3.4 10*3/uL — ABNORMAL HIGH (ref 0.7–3.1)
Lymphs: 41 %
MCH: 31.3 pg (ref 26.6–33.0)
MCHC: 34.6 g/dL (ref 31.5–35.7)
MCV: 91 fL (ref 79–97)
Monocytes Absolute: 0.5 10*3/uL (ref 0.1–0.9)
Monocytes: 6 %
Neutrophils Absolute: 4.2 10*3/uL (ref 1.4–7.0)
Neutrophils: 52 %
RBC: 4.02 x10E6/uL (ref 3.77–5.28)
RDW: 13.4 % (ref 11.7–15.4)
WBC: 8.2 10*3/uL (ref 3.4–10.8)

## 2019-05-09 LAB — HEMOGLOBIN A1C
Est. average glucose Bld gHb Est-mCnc: 151 mg/dL
Hgb A1c MFr Bld: 6.9 % — ABNORMAL HIGH (ref 4.8–5.6)

## 2019-05-09 LAB — TSH: TSH: 1.26 u[IU]/mL (ref 0.450–4.500)

## 2019-05-09 LAB — HIV ANTIBODY (ROUTINE TESTING W REFLEX): HIV Screen 4th Generation wRfx: NONREACTIVE

## 2019-05-10 NOTE — Progress Notes (Signed)
A1C at goal.   TSH normal.   HIV negative.   CBC- lymphocytes slightly elevated.  CMP- BUN and BUN/creatinine decreased, continued management of diabetes will help improve these numbers. Alkaline phosphatase slightly decreased.

## 2019-05-11 ENCOUNTER — Other Ambulatory Visit: Payer: Self-pay | Admitting: Pharmacist

## 2019-05-11 ENCOUNTER — Telehealth: Payer: Self-pay

## 2019-05-11 DIAGNOSIS — E119 Type 2 diabetes mellitus without complications: Secondary | ICD-10-CM

## 2019-05-11 MED ORDER — TRUE METRIX BLOOD GLUCOSE TEST VI STRP: ORAL_STRIP | 11 refills | Status: DC

## 2019-05-11 MED FILL — TRUE METRIX TEST STRIP: 25 days supply | Qty: 100 | Fill #0

## 2019-05-11 NOTE — Telephone Encounter (Signed)
Contacted pt to go over lab results pt is aware and doesn't have any questions or concerns 

## 2019-05-19 MED FILL — TRUEplus LANCETS 28G MISC: 30 days supply | Qty: 100 | Fill #0

## 2019-05-19 MED FILL — TRUE METRIX TEST STRIP: 25 days supply | Qty: 100 | Fill #0

## 2019-05-20 ENCOUNTER — Other Ambulatory Visit: Payer: Self-pay | Admitting: Pharmacist

## 2019-05-20 DIAGNOSIS — E119 Type 2 diabetes mellitus without complications: Secondary | ICD-10-CM

## 2019-05-20 MED ORDER — TRUE METRIX METER W/DEVICE KIT: PACK | 0 refills | Status: DC

## 2019-05-20 MED FILL — !TRUE METRIX BLOOD GLUCOSE: 365 days supply | Qty: 1 | Fill #0

## 2019-05-27 ENCOUNTER — Ambulatory Visit: Payer: Self-pay | Admitting: Family Medicine

## 2019-06-04 ENCOUNTER — Ambulatory Visit: Payer: Self-pay

## 2019-06-05 ENCOUNTER — Ambulatory Visit: Payer: Self-pay | Admitting: Family Medicine

## 2019-06-15 MED FILL — GABAPENTIN 300 MG CAPSULE: 300 | 30 days supply | Qty: 30 | Fill #1

## 2019-06-15 MED FILL — METFORMIN HCL 500 MG TABS: 500 | 30 days supply | Qty: 30 | Fill #1

## 2019-07-06 ENCOUNTER — Ambulatory Visit: Payer: Self-pay | Admitting: Internal Medicine

## 2019-07-24 MED FILL — GABAPENTIN 300 MG CAPSULE: 300 | 30 days supply | Qty: 30 | Fill #2

## 2019-07-24 MED FILL — METFORMIN HCL 500 MG TABS: 500 | 30 days supply | Qty: 30 | Fill #2

## 2019-07-31 ENCOUNTER — Ambulatory Visit: Payer: Self-pay | Admitting: Internal Medicine

## 2019-08-05 ENCOUNTER — Other Ambulatory Visit: Payer: Self-pay

## 2019-08-05 ENCOUNTER — Emergency Department (HOSPITAL_BASED_OUTPATIENT_CLINIC_OR_DEPARTMENT_OTHER): Payer: Self-pay

## 2019-08-05 ENCOUNTER — Encounter (HOSPITAL_BASED_OUTPATIENT_CLINIC_OR_DEPARTMENT_OTHER): Payer: Self-pay | Admitting: Emergency Medicine

## 2019-08-05 DIAGNOSIS — R109 Unspecified abdominal pain: Secondary | ICD-10-CM | POA: Insufficient documentation

## 2019-08-05 DIAGNOSIS — Z87891 Personal history of nicotine dependence: Secondary | ICD-10-CM | POA: Insufficient documentation

## 2019-08-05 DIAGNOSIS — E119 Type 2 diabetes mellitus without complications: Secondary | ICD-10-CM | POA: Insufficient documentation

## 2019-08-05 DIAGNOSIS — Z794 Long term (current) use of insulin: Secondary | ICD-10-CM | POA: Insufficient documentation

## 2019-08-05 DIAGNOSIS — Z79899 Other long term (current) drug therapy: Secondary | ICD-10-CM | POA: Insufficient documentation

## 2019-08-05 DIAGNOSIS — R0789 Other chest pain: Secondary | ICD-10-CM | POA: Insufficient documentation

## 2019-08-05 MED ORDER — SODIUM CHLORIDE 0.9% FLUSH
3.0000 mL | Freq: Once | INTRAVENOUS | Status: DC
  Filled 2019-08-05: qty 3

## 2019-08-05 NOTE — ED Triage Notes (Addendum)
Pt states she is having pain in the right kidney  Pt states her kidney levels were elevated when she last had them checked and that she has been retaining fluid  Pt is c/o left sided chest pain that started today  Pt states when she takes a deep breath she gets a sharp pain in her left chest  Pt is c/o some shortness of breath   Pt adds that she has been having night sweats lately and that she has been having a lot of anxiety and feels depressed   Pt states she has been having problems with her speech and has been stuttering lately and her brain has been feeling foggy

## 2019-08-06 ENCOUNTER — Emergency Department (HOSPITAL_BASED_OUTPATIENT_CLINIC_OR_DEPARTMENT_OTHER)
Admission: EM | Admit: 2019-08-06 | Discharge: 2019-08-06 | Disposition: A | Discharge status: Home / Self Care | Payer: Self-pay | Attending: Emergency Medicine | Admitting: Emergency Medicine

## 2019-08-06 DIAGNOSIS — R0789 Other chest pain: Secondary | ICD-10-CM

## 2019-08-06 LAB — BASIC METABOLIC PANEL
Anion gap: 8 (ref 5–15)
BUN: 10 mg/dL (ref 6–20)
CO2: 22 mmol/L (ref 22–32)
Calcium: 9.2 mg/dL (ref 8.9–10.3)
Chloride: 104 mmol/L (ref 98–111)
Creatinine, Ser: 0.82 mg/dL (ref 0.44–1.00)
GFR calc Af Amer: >60 mL/min (ref >60)
GFR calc non Af Amer: >60 mL/min (ref >60)
Glucose, Bld: 143 mg/dL — ABNORMAL HIGH (ref 70–99)
Potassium: 3.5 mmol/L (ref 3.5–5.1)
Sodium: 134 mmol/L — ABNORMAL LOW (ref 135–145)

## 2019-08-06 LAB — CBC
HCT: 37.5 % (ref 36.0–46.0)
Hemoglobin: 12.3 g/dL (ref 12.0–15.0)
MCH: 30.6 pg (ref 26.0–34.0)
MCHC: 32.8 g/dL (ref 30.0–36.0)
MCV: 93.3 fL (ref 80.0–100.0)
Platelets: 365 10*3/uL (ref 150–400)
RBC: 4.02 MIL/uL (ref 3.87–5.11)
RDW: 13.4 % (ref 11.5–15.5)
WBC: 10.7 10*3/uL — ABNORMAL HIGH (ref 4.0–10.5)
nRBC: 0 % (ref 0.0–0.2)

## 2019-08-06 LAB — URINALYSIS, ROUTINE W REFLEX MICROSCOPIC
Bilirubin Urine: NEGATIVE
Glucose, UA: NEGATIVE mg/dL
Hgb urine dipstick: NEGATIVE
Ketones, ur: 15 mg/dL — AB
Leukocytes,Ua: NEGATIVE
Nitrite: NEGATIVE
Protein, ur: NEGATIVE mg/dL
Specific Gravity, Urine: >1.03 — ABNORMAL HIGH (ref 1.005–1.030)
pH: 5 (ref 5.0–8.0)

## 2019-08-06 LAB — PREGNANCY, URINE: Preg Test, Ur: NEGATIVE

## 2019-08-06 LAB — TROPONIN I (HIGH SENSITIVITY)
Troponin I (High Sensitivity): 3 ng/L (ref <18)
Troponin I (High Sensitivity): 3 ng/L (ref <18)

## 2019-08-06 MED ORDER — IBUPROFEN 800 MG PO TABS
800.0000 mg | ORAL_TABLET | Freq: Once | ORAL | Status: AC
  Administered 2019-08-06: 800 mg via ORAL
  Filled 2019-08-06: qty 1

## 2019-08-06 MED ORDER — ACETAMINOPHEN 500 MG PO TABS
1000.0000 mg | ORAL_TABLET | Freq: Once | ORAL | Status: AC
  Administered 2019-08-06: 1000 mg via ORAL
  Filled 2019-08-06: qty 2

## 2019-08-06 NOTE — ED Provider Notes (Signed)
[facility] EMERGENCY DEPARTMENT Provider Note   CSN: [id number] Arrival date & time: 08/05/19  2321     History Chief Complaint  Patient presents with  . Chest Pain  . Flank Pain    Colleen Downs is a 39 y.o. female.  The history is provided by the patient.  Chest Pain Pain location:  L chest Pain quality: aching   Pain radiates to:  Does not radiate Pain severity:  Moderate Onset quality:  Gradual Duration:  2 weeks Timing:  Constant Progression:  Unchanged Chronicity:  New Context: movement   Context: not breathing   Relieved by:  Nothing Worsened by:  Nothing Ineffective treatments:  None tried Associated symptoms: no AICD problem, no altered mental status, no anorexia, no anxiety, no back pain, no claudication, no cough, no diaphoresis, no dizziness, no dysphagia, no fatigue, no fever, no heartburn, no lower extremity edema, no nausea, no near-syncope, no numbness, no orthopnea, no palpitations, no PND, no syncope, no vomiting and no weakness   Associated symptoms comment:  ALSO REPORTS ELEVATED CREATININE AND KIDNEY ISSUES  Risk factors: no aortic disease        Past Medical History:  Diagnosis Date  . Diabetes mellitus without complication ([place])   . Diarrhea   . Irregular periods     Patient Active Problem List   Diagnosis Date Noted  . Abnormal uterine bleeding 07/29/2017  . Bipolar depression ([place]) 03/19/2017  . Irritable bowel syndrome with both constipation and diarrhea 12/25/2016  . New onset type 2 diabetes mellitus ([place]) 12/25/2016  . Class 3 severe obesity due to excess calories without serious comorbidity with body mass index (BMI) of 40.0 to 44.9 in adult ([place]) 12/25/2016  . Irregular menses 12/25/2016  . Immunization due 12/24/2016    Past Surgical History:  Procedure Laterality Date  . CHOLECYSTECTOMY       OB History    Gravida  0   Para  0   Term  0   Preterm  0   AB  0   Living  0     SAB  0   TAB  0    Ectopic  0   Multiple  0   Live Births  0           Family History  Problem Relation Age of Onset  . Rheum arthritis Mother   . Rheum arthritis Sister   . Arthritis Maternal Uncle   . Arthritis Paternal Uncle   . Diabetes Maternal Grandmother     Social History   Tobacco Use  . Smoking status: Former Research scientist (life sciences)  . Smokeless tobacco: Never Used  Vaping Use  . Vaping Use: Never used  Substance Use Topics  . Alcohol use: Yes    Comment: social  . Drug use: Yes    Types: Marijuana    Home Medications Prior to Admission medications   Medication Sig Start Date End Date Taking? Authorizing Provider  Blood Glucose Monitoring Suppl (TRUE METRIX METER) w/Device KIT Use to check blood sugar as directed. 05/20/19   Fulp, Cammie, MD  clotrimazole (LOTRIMIN) 1 % cream Apply 1 application topically 2 (two) times daily. Patient not taking: Reported on 05/07/2018 01/14/18   Charlott Rakes, MD  gabapentin (NEURONTIN) 300 MG capsule Take 1 capsule (300 mg total) by mouth at bedtime. 05/08/19   Camillia Herter, NP  glucose blood (TRUE METRIX BLOOD GLUCOSE TEST) test strip Use as instructed 05/11/19   Antony Blackbird, MD  Insulin Pen  Needle 31G X 5 MM MISC 1 each by Does not apply route at bedtime. 04/02/17   Charlott Rakes, MD  metFORMIN (GLUCOPHAGE) 500 MG tablet Take 1 pill as tolerated once daily after the largest meal of the day 05/08/19   Camillia Herter, NP  naproxen (NAPROSYN) 500 MG tablet Take 1 tablet (500 mg total) by mouth 2 (two) times daily as needed for moderate pain. Patient not taking: Reported on 05/07/2018 01/21/18   Long, Wonda Olds, MD  naproxen (NAPROSYN) 500 MG tablet Take 1 tablet (500 mg total) by mouth 2 (two) times daily. 07/24/18   Fredia Sorrow, MD  Norgestimate-Ethinyl Estradiol Triphasic (TRI-SPRINTEC) 0.18/0.215/0.25 MG-35 MCG tablet Take 1 tablet by mouth daily. Patient not taking: Reported on 04/07/2018 11/25/17   Woodroe Mode, MD  omeprazole (PRILOSEC) 40 MG  capsule Take 1 capsule (40 mg total) by mouth daily. To decrease stomach acid Patient not taking: Reported on 05/07/2018 04/07/18   Antony Blackbird, MD  TRUEplus Lancets 28G MISC USE AS DIRECTED 05/11/19   Fulp, Cammie, MD    Allergies    Sulfa drugs cross reactors  Review of Systems   Review of Systems  Constitutional: Negative for diaphoresis, fatigue and fever.  HENT: Negative for trouble swallowing.   Respiratory: Negative for cough.   Cardiovascular: Negative for palpitations, orthopnea, claudication, syncope, PND and near-syncope.  Gastrointestinal: Negative for anorexia, heartburn, nausea and vomiting.  Musculoskeletal: Negative for back pain.  Neurological: Negative for dizziness, weakness and numbness.    Physical Exam Updated Vital Signs BP 116/62   Pulse 78   Temp 99.9 F (37.7 C) (Oral)   Resp (!) 21   Ht _0  (1.651 m)   Wt 113.4 kg   LMP 07/04/2019 (Approximate)   SpO2 99%   BMI 41.60 kg/m   Physical Exam Vitals and nursing note reviewed.  Constitutional:      General: She is not in acute distress.    Appearance: Normal appearance.  HENT:     Head: Normocephalic and atraumatic.     Nose: Nose normal.  Eyes:     Conjunctiva/sclera: Conjunctivae normal.     Pupils: Pupils are equal, round, and reactive to light.  Cardiovascular:     Rate and Rhythm: Normal rate and regular rhythm.     Pulses: Normal pulses.     Heart sounds: Normal heart sounds.  Pulmonary:     Effort: Pulmonary effort is normal.     Breath sounds: Normal breath sounds.  Abdominal:     General: Abdomen is flat. Bowel sounds are normal.     Tenderness: There is no abdominal tenderness. There is no guarding or rebound.  Musculoskeletal:        General: Normal range of motion.     Cervical back: Normal range of motion and neck supple.  Skin:    General: Skin is warm and dry.     Capillary Refill: Capillary refill takes less than 2 seconds.  Neurological:     General: No focal deficit  present.     Mental Status: She is alert and oriented to person, place, and time.     Deep Tendon Reflexes: Reflexes normal.  Psychiatric:        Mood and Affect: Mood normal.        Behavior: Behavior normal.     ED Results / Procedures / Treatments   Labs (all labs ordered are listed, but only abnormal results are displayed) Results for orders placed or performed  during the hospital encounter of 35/46/56  Basic metabolic panel  Result Value Ref Range   Sodium 134 (L) 135 - 145 mmol/L   Potassium 3.5 3.5 - 5.1 mmol/L   Chloride 104 98 - 111 mmol/L   CO2 22 22 - 32 mmol/L   Glucose, Bld 143 (H) 70 - 99 mg/dL   BUN 10 6 - 20 mg/dL   Creatinine, Ser 0.82 0.44 - 1.00 mg/dL   Calcium 9.2 8.9 - 10.3 mg/dL   GFR calc non Af Amer >60 >60 mL/min   GFR calc Af Amer >60 >60 mL/min   Anion gap 8 5 - 15  CBC  Result Value Ref Range   WBC 10.7 (H) 4.0 - 10.5 K/uL   RBC 4.02 3.87 - 5.11 MIL/uL   Hemoglobin 12.3 12.0 - 15.0 g/dL   HCT 37.5 36 - 46 %   MCV 93.3 80.0 - 100.0 fL   MCH 30.6 26.0 - 34.0 pg   MCHC 32.8 30.0 - 36.0 g/dL   RDW 13.4 11.5 - 15.5 %   Platelets 365 150 - 400 K/uL   nRBC 0.0 0.0 - 0.2 %  Pregnancy, urine  Result Value Ref Range   Preg Test, Ur NEGATIVE NEGATIVE  Troponin I (High Sensitivity)  Result Value Ref Range   Troponin I (High Sensitivity) 3 <18 ng/L  Troponin I (High Sensitivity)  Result Value Ref Range   Troponin I (High Sensitivity) 3 <18 ng/L   DG Chest 2 View  Result Date: 08/05/2019 CLINICAL DATA:  Right kidney pain, short of breath, left-sided chest pain EXAM: CHEST - 2 VIEW COMPARISON:  06/05/2019 FINDINGS: The heart size and mediastinal contours are within normal limits. Both lungs are clear. The visualized skeletal structures are unremarkable. IMPRESSION: No active cardiopulmonary disease. Electronically Signed   By: Randa Ngo M.D.   On: 08/05/2019 23:58    EKG EKG Interpretation  Date/Time:  Wednesday August 05 2019 23:31:36  EDT Ventricular Rate:  76 PR Interval:  118 QRS Duration: 86 QT Interval:  352 QTC Calculation: 396 R Axis:   41 Text Interpretation: Normal sinus rhythm no change from previous Confirmed by Randal Buba, Halea Lieb ([id number]) on 08/06/2019 2:49:30 AM   Radiology DG Chest 2 View  Result Date: 08/05/2019 CLINICAL DATA:  Right kidney pain, short of breath, left-sided chest pain EXAM: CHEST - 2 VIEW COMPARISON:  06/05/2019 FINDINGS: The heart size and mediastinal contours are within normal limits. Both lungs are clear. The visualized skeletal structures are unremarkable. IMPRESSION: No active cardiopulmonary disease. Electronically Signed   By: Randa Ngo M.D.   On: 08/05/2019 23:58    Procedures Procedures (including critical care time)  Medications Ordered in ED Medications  sodium chloride flush (NS) 0.9 % injection 3 mL (has no administration in time range)  acetaminophen (TYLENOL) tablet 1,000 mg (has no administration in time range)  ibuprofen (ADVIL) tablet 800 mg (has no administration in time range)    ED Course  I have reviewed the triage vital signs and the nursing notes.  Pertinent labs & imaging results that were available during my care of the patient were reviewed by me and considered in my medical decision making (see chart for details).   Ruled out for MI in the ED heart score is 1 low risk for mace.  PERC negative wells 0 highly doubt PE in this low risk patient.  No signs of infection on urine.  Moreover patient reports elevated creatinine and it is normal.  Continue  to hydrate and follow up with your PMD.  Chest pain is classically reproducible and is MSK in nature.  I suspect flank pain is as well.  Tylenol alternating with ibuprofen and ice.    Colleen Downs was evaluated in Emergency Department on 08/06/2019 for the symptoms described in the history of present illness. She was evaluated in the context of the global COVID-19 pandemic, which necessitated consideration that the  patient might be at risk for infection with the SARS-CoV-2 virus that causes COVID-19. Institutional protocols and algorithms that pertain to the evaluation of patients at risk for COVID-19 are in a state of rapid change based on information released by regulatory bodies including the CDC and federal and state organizations. These policies and algorithms were followed during the patient's care in the ED.   Final Clinical Impression(s) / ED Diagnoses Return for intractable cough, coughing up blood,fevers >100.4 unrelieved by medication, shortness of breath, intractable vomiting, chest pain, shortness of breath, weakness,numbness, changes in speech, facial asymmetry,abdominal pain, passing out,Inability to tolerate liquids or food, cough, altered mental status or any concerns. No signs of systemic illness or infection. The patient is nontoxic-appearing on exam and vital signs are within normal limits.   I have reviewed the triage vital signs and the nursing notes. Pertinent labs &imaging results that were available during my care of the patient were reviewed by me and considered in my medical decision making (see chart for details).After history, exam, and medical workup I feel the patient has beenappropriately medically screened and is safe for discharge home. Pertinent diagnoses were discussed with the patient. Patient was given return precautions.      Claude Swendsen, MD 08/06/19 [id number]

## 2019-08-12 NOTE — Progress Notes (Signed)
[name], female   DOB: 09-28-1980, 39 y.o.   MRN: [id number]     Colleen Downs, is a 39 y.o. female  HWE:[id number]  ELF:[id number]  DOB - December 10, 1980  Subjective:  Chief Complaint and HPI: Colleen Downs is a 39 y.o. female here today for a follow up visit After ED 08/06/2019 for CP.  Cardiac was ruled out.  She presents today with multiple issues.  She is describing to me L breast pain and noticed a little lump there about 1 month ago that was not there previously.  No FH breast CA.  Also c/o intermittent edema >R than L.  This has been occurring for about 6 months.  Sits down at work.  No regular exercise.  Tells me she drinks a lot of water but last urine shows ketones and today's urine is cloudy.    Some urinary frequency and occasional R "kidney" pain.  Says multiple times that she has kidney problems but kidney function has been good with all recent labs.  Occasional dysuria.  No vaginal discharge.    Also wants a med for weight loss  C/o depression but does not want to see our Education officer, museum or a Social worker and does not want to be on meds.  Says "nothing ever works."  Depression screen [facility] 2/9 08/13/2019 05/07/2018 04/07/2018  Decreased Interest 0 1 2  Down, Depressed, Hopeless '2 2 2  '$ PHQ - 2 Score '2 3 4  '$ Altered sleeping '1 1 2  '$ Tired, decreased energy '3 2 2  '$ Change in appetite '1 2 2  '$ Feeling bad or failure about yourself  1 0 1  Trouble concentrating 0 0 1  Moving slowly or fidgety/restless 0 0 0  Suicidal thoughts 0 0 1  PHQ-9 Score '8 8 13  '$ Some recent data might be hidden        Ruled out for MI in the ED heart score is 1 low risk for mace.  PERC negative wells 0 highly doubt PE in this low risk patient.  No signs of infection on urine.  Moreover patient reports elevated creatinine and it is normal.  Continue to hydrate and follow up with your PMD.  Chest pain is classically reproducible and is MSK in nature.  I suspect flank pain is as well.  Tylenol  alternating with ibuprofen and ice.    Colleen Downs was evaluated in Emergency Department on 08/06/2019 for the symptoms described in the history of present illness. She was evaluated in the context of the global COVID-19 pandemic, which necessitated consideration that the patient might be at risk for infection with the SARS-CoV-2 virus that causes COVID-19. Institutional protocols and algorithms that pertain to the evaluation of patients at risk for COVID-19 are in a state of rapid change based on information released by regulatory bodies including the CDC and federal and state organizations. These policies and algorithms were followed during the patient's care in the ED.  ED/Hospital notes reviewed.    ROS:   Constitutional:  No f/c, No night sweats, No unexplained weight loss. EENT:  No vision changes, No blurry vision, No hearing changes. No mouth, throat, or ear problems.  Respiratory: No cough, No SOB Cardiac: No CP, no palpitations GI:  No abd pain, No N/V/D. GU: No Urinary s/sx Musculoskeletal: No joint pain Neuro: No headache, no dizziness, no motor weakness.  Skin: No rash Endocrine:  No polydipsia. No polyuria.  Psych: Denies SI/HI  No problems updated.  ALLERGIES: Allergies  Allergen  Reactions  . Sulfa Drugs Cross Reactors Anaphylaxis    PAST MEDICAL HISTORY: Past Medical History:  Diagnosis Date  . Diabetes mellitus without complication ([place])   . Diarrhea   . Irregular periods     MEDICATIONS AT HOME: Prior to Admission medications   Medication Sig Start Date End Date Taking? Authorizing Provider  Blood Glucose Monitoring Suppl (TRUE METRIX METER) w/Device KIT Use to check blood sugar as directed. 05/20/19  Yes Fulp, Cammie, MD  gabapentin (NEURONTIN) 300 MG capsule Take 1 capsule (300 mg total) by mouth at bedtime. 08/13/19  Yes Brookelynne Dimperio M, PA-C  glucose blood (TRUE METRIX BLOOD GLUCOSE TEST) test strip Use as instructed 05/11/19  Yes Fulp, Cammie, MD    Insulin Pen Needle 31G X 5 MM MISC 1 each by Does not apply route at bedtime. 04/02/17  Yes Charlott Rakes, MD  metFORMIN (GLUCOPHAGE) 500 MG tablet Take 1 pill as tolerated once daily after the largest meal of the day 08/13/19  Yes Joncarlos Atkison M, PA-C  naproxen (NAPROSYN) 500 MG tablet Take 1 tablet (500 mg total) by mouth 2 (two) times daily. 08/13/19  Yes Freeman Caldron M, PA-C  TRUEplus Lancets 28G MISC USE AS DIRECTED 05/11/19  Yes Fulp, Cammie, MD  clotrimazole (LOTRIMIN) 1 % cream Apply 1 application topically 2 (two) times daily. Patient not taking: Reported on 05/07/2018 01/14/18   Charlott Rakes, MD  fluconazole (DIFLUCAN) 150 MG tablet Take 1 tablet (150 mg total) by mouth once for 1 dose. 08/13/19 08/13/19  Argentina Donovan, PA-C  naproxen (NAPROSYN) 500 MG tablet Take 1 tablet (500 mg total) by mouth 2 (two) times daily as needed for moderate pain. Patient not taking: Reported on 05/07/2018 01/21/18   Long, Wonda Olds, MD  nitrofurantoin, macrocrystal-monohydrate, (MACROBID) 100 MG capsule Take 1 capsule (100 mg total) by mouth 2 (two) times daily. 08/13/19   Argentina Donovan, PA-C  Norgestimate-Ethinyl Estradiol Triphasic (TRI-SPRINTEC) 0.18/0.215/0.25 MG-35 MCG tablet Take 1 tablet by mouth daily. Patient not taking: Reported on 04/07/2018 11/25/17   Woodroe Mode, MD  omeprazole (PRILOSEC) 40 MG capsule Take 1 capsule (40 mg total) by mouth daily. To decrease stomach acid Patient not taking: Reported on 05/07/2018 04/07/18   Fulp, Cammie, MD     Objective:  EXAM:   Vitals:   08/13/19 1548  BP: 132/87  Pulse: 61  Temp: 97.7 F (36.5 C)  TempSrc: Temporal  SpO2: 95%  Weight: 250 lb (113.4 kg)  Height: '5\' 5"'$  (1.651 m)    General appearance : A&OX3. NAD. Non-toxic-appearing HEENT: Atraumatic and Normocephalic.  PERRLA. EOM intact.   Chest/Lungs:  Breathing-non-labored, Good air entry bilaterally, breath sounds normal without rales, rhonchi, or wheezing  CVS: S1 S2 regular,  no murmurs, gallops, rubs  Less than 1cm freely mobile very superficial lump(feels like healed cystic acne) just under skin at 2 o'clock.   Extremities: Bilateral Lower Ext shows 1+ edema B, both legs are warm to touch with = pulse throughout.  No calf redness or tenderness or induration on either leg.  Neg Homan's B.   Neurology:  CN II-XII grossly intact, Non focal.   Psych:  TP linear. J/I fair. Normal speech. Appropriate eye contact and flat affect.  Skin:  No Rash  Data Review Lab Results  Component Value Date   HGBA1C 6.9 (H) 05/08/2019   HGBA1C 6.6 (A) 04/07/2018   HGBA1C 6.0 12/04/2017     Assessment & Plan   1. Type 2 diabetes mellitus  with hyperglycemia, unspecified whether long term insulin use ([place]) Not at goal.  Needs to work on diet/exercise.  Counseled at length.  No "quick fix" pill available.  - Glucose (CBG) - metFORMIN (GLUCOPHAGE) 500 MG tablet; Take 1 pill as tolerated once daily after the largest meal of the day  Dispense: 30 tablet; Refill: 2 - gabapentin (NEURONTIN) 300 MG capsule; Take 1 capsule (300 mg total) by mouth at bedtime.  Dispense: 30 capsule; Refill: 2  2. Flank pain Increase water intake - POCT URINALYSIS DIP (CLINITEK) - naproxen (NAPROSYN) 500 MG tablet; Take 1 tablet (500 mg total) by mouth 2 (two) times daily.  Dispense: 14 tablet; Refill: 0  3. Right foot pain - naproxen (NAPROSYN) 500 MG tablet; Take 1 tablet (500 mg total) by mouth 2 (two) times daily.  Dispense: 14 tablet; Refill: 0  4. Paresthesia - gabapentin (NEURONTIN) 300 MG capsule; Take 1 capsule (300 mg total) by mouth at bedtime.  Dispense: 30 capsule; Refill: 2  5. Hyponatremia - Basic metabolic panel  6. Edema, unspecified type Reviewed patient education and encourage lifestyle modifications - Basic metabolic panel  7. Depression, unspecified depression type Counseled at length on self-care, proper diet and exercise as well as rest.  She declined other support as  offered above.  Denies SI/HI.  Says she will call social worker if she changes her mind about counseling - Vitamin D, 25-hydroxy  8. Breast lump on left side at 2 o'clock position - MM Digital Diagnostic Unilat L; Future  9. Encounter for examination following treatment at hospital   10. Pyuria Increase water intake - Urine Culture - nitrofurantoin, macrocrystal-monohydrate, (MACROBID) 100 MG capsule; Take 1 capsule (100 mg total) by mouth 2 (two) times daily.  Dispense: 10 capsule; Refill: 0 - fluconazole (DIFLUCAN) 150 MG tablet; Take 1 tablet (150 mg total) by mouth once for 1 dose.  Dispense: 1 tablet; Refill: 0     Patient have been counseled extensively about nutrition and exercise  Return in about 2 months (around 10/13/2019) for PCP;  chronic conditions.  The patient was given clear instructions to go to ER or return to medical center if symptoms don't improve, worsen or new problems develop. The patient verbalized understanding. The patient was told to call to get lab results if they haven't heard anything in the next week.     Freeman Caldron, PA-C [facility] and [place], [place]   08/13/2019, 4:28 PM

## 2019-08-13 ENCOUNTER — Other Ambulatory Visit: Payer: Self-pay

## 2019-08-13 ENCOUNTER — Ambulatory Visit: Payer: Self-pay | Attending: Family Medicine | Admitting: Physician Assistant

## 2019-08-13 VITALS — BP 132/87 | HR 61 | Temp 97.7°F | Ht 65.0 in | Wt 250.0 lb

## 2019-08-13 DIAGNOSIS — R109 Unspecified abdominal pain: Secondary | ICD-10-CM

## 2019-08-13 DIAGNOSIS — Z09 Encounter for follow-up examination after completed treatment for conditions other than malignant neoplasm: Secondary | ICD-10-CM

## 2019-08-13 DIAGNOSIS — F329 Major depressive disorder, single episode, unspecified: Secondary | ICD-10-CM

## 2019-08-13 DIAGNOSIS — R8281 Pyuria: Secondary | ICD-10-CM

## 2019-08-13 DIAGNOSIS — R10A0 Flank pain, unspecified side: Secondary | ICD-10-CM

## 2019-08-13 DIAGNOSIS — F32A Depression, unspecified: Secondary | ICD-10-CM

## 2019-08-13 DIAGNOSIS — R609 Edema, unspecified: Secondary | ICD-10-CM

## 2019-08-13 DIAGNOSIS — E1165 Type 2 diabetes mellitus with hyperglycemia: Secondary | ICD-10-CM

## 2019-08-13 DIAGNOSIS — E871 Hypo-osmolality and hyponatremia: Secondary | ICD-10-CM

## 2019-08-13 DIAGNOSIS — E119 Type 2 diabetes mellitus without complications: Secondary | ICD-10-CM

## 2019-08-13 DIAGNOSIS — R202 Paresthesia of skin: Secondary | ICD-10-CM

## 2019-08-13 DIAGNOSIS — N6321 Unspecified lump in the left breast, upper outer quadrant: Secondary | ICD-10-CM

## 2019-08-13 DIAGNOSIS — M79671 Pain in right foot: Secondary | ICD-10-CM

## 2019-08-13 LAB — POCT URINALYSIS DIP (CLINITEK)
Bilirubin, UA: NEGATIVE
Glucose, UA: NEGATIVE mg/dL
Ketones, POC UA: NEGATIVE mg/dL
Nitrite, UA: NEGATIVE
POC PROTEIN,UA: NEGATIVE
Spec Grav, UA: >=1.03 — AB (ref 1.010–1.025)
Urobilinogen, UA: 0.2 E.U./dL
pH, UA: 5.5 (ref 5.0–8.0)

## 2019-08-13 LAB — GLUCOSE, POCT (MANUAL RESULT ENTRY): POC Glucose: 114 mg/dl — AB (ref 70–99)

## 2019-08-13 MED ORDER — NAPROXEN 500 MG PO TABS: 500.0000 mg | ORAL_TABLET | Freq: Two times a day (BID) | ORAL | 0 refills | Status: DC

## 2019-08-13 MED ORDER — GABAPENTIN 300 MG PO CAPS: 300.0000 mg | ORAL_CAPSULE | Freq: Every day | ORAL | 2 refills | Status: DC

## 2019-08-13 MED ORDER — FLUCONAZOLE 150 MG PO TABS: 150.0000 mg | ORAL_TABLET | Freq: Once | ORAL | 0 refills | Status: AC

## 2019-08-13 MED ORDER — NITROFURANTOIN MONOHYD MACRO 100 MG PO CAPS: 100.0000 mg | ORAL_CAPSULE | Freq: Two times a day (BID) | ORAL | 0 refills | Status: DC

## 2019-08-13 MED ORDER — METFORMIN HCL 500 MG PO TABS: ORAL_TABLET | ORAL | 2 refills | Status: DC

## 2019-08-13 MED FILL — NITROFURANTOIN MONO-MCR 100: 100 | 10 days supply | Qty: 10 | Fill #0

## 2019-08-13 MED FILL — FLUCONAZOLE 150 MG TABLET: 150 | 1 days supply | Qty: 1 | Fill #0

## 2019-08-13 MED FILL — NAPROXEN 500 MG TABLET: 500 | 7 days supply | Qty: 14 | Fill #0

## 2019-08-13 NOTE — Patient Instructions (Addendum)
Drink 80-100 ounces of water daily.     Edema  Edema is when you have too much fluid in your body or under your skin. Edema may make your legs, feet, and ankles swell up. Swelling is also common in looser tissues, like around your eyes. This is a common condition. It gets more common as you get older. There are many possible causes of edema. Eating too much salt (sodium) and being on your feet or sitting for a long time can cause edema in your legs, feet, and ankles. Hot weather may make edema worse. Edema is usually painless. Your skin may look swollen or shiny. Follow these instructions at home:  Keep the swollen body part raised (elevated) above the level of your heart when you are sitting or lying down.  Do not sit still or stand for a long time.  Do not wear tight clothes. Do not wear garters on your upper legs.  Exercise your legs. This can help the swelling go down.  Wear elastic bandages or support stockings as told by your doctor.  Eat a low-salt (low-sodium) diet to reduce fluid as told by your doctor.  Depending on the cause of your swelling, you may need to limit how much fluid you drink (fluid restriction).  Take over-the-counter and prescription medicines only as told by your doctor. Contact a doctor if:  Treatment is not working.  You have heart, liver, or kidney disease and have symptoms of edema.  You have sudden and unexplained weight gain. Get help right away if:  You have shortness of breath or chest pain.  You cannot breathe when you lie down.  You have pain, redness, or warmth in the swollen areas.  You have heart, liver, or kidney disease and get edema all of a sudden.  You have a fever and your symptoms get worse all of a sudden. Summary  Edema is when you have too much fluid in your body or under your skin.  Edema may make your legs, feet, and ankles swell up. Swelling is also common in looser tissues, like around your eyes.  Raise (elevate)  the swollen body part above the level of your heart when you are sitting or lying down.  Follow your doctor's instructions about diet and how much fluid you can drink (fluid restriction). This information is not intended to replace advice given to you by your health care provider. Make sure you discuss any questions you have with your health care provider. Document Revised: 02/15/2017 Document Reviewed: 03/02/2016 Elsevier Patient Education  2020 [employer].

## 2019-08-14 ENCOUNTER — Telehealth: Payer: Self-pay | Admitting: Family Medicine

## 2019-08-14 ENCOUNTER — Other Ambulatory Visit: Payer: Self-pay | Admitting: Physician Assistant

## 2019-08-14 LAB — BASIC METABOLIC PANEL
BUN/Creatinine Ratio: 8 — ABNORMAL LOW (ref 9–23)
BUN: 6 mg/dL (ref 6–20)
CO2: 22 mmol/L (ref 20–29)
Calcium: 9.4 mg/dL (ref 8.7–10.2)
Chloride: 101 mmol/L (ref 96–106)
Creatinine, Ser: 0.8 mg/dL (ref 0.57–1.00)
GFR calc Af Amer: 108 mL/min/{1.73_m2} (ref >59)
GFR calc non Af Amer: 94 mL/min/{1.73_m2} (ref >59)
Glucose: 91 mg/dL (ref 65–99)
Potassium: 4.2 mmol/L (ref 3.5–5.2)
Sodium: 136 mmol/L (ref 134–144)

## 2019-08-14 LAB — VITAMIN D 25 HYDROXY (VIT D DEFICIENCY, FRACTURES): Vit D, 25-Hydroxy: 6.9 ng/mL — ABNORMAL LOW (ref 30.0–100.0)

## 2019-08-14 MED ORDER — VITAMIN D (ERGOCALCIFEROL) 1.25 MG (50000 UNIT) PO CAPS
50000.0000 [IU] | ORAL_CAPSULE | ORAL | 0 refills | Status: DC
Start: 2019-08-14 — End: 2020-05-17

## 2019-08-14 MED FILL — VIT D2 1.25 MG (50,000 UNIT: 1.25 MG | 28 days supply | Qty: 4 | Fill #0

## 2019-08-14 NOTE — Telephone Encounter (Signed)
Patient called in and requested for lab results. Patient was verified by 2 patient identifiers. Results were given. Patient verbalized understanding and had no further questions or concerns at this time. Patient stated she would pick up medications Monday 08-17-2019.

## 2019-08-15 LAB — URINE CULTURE

## 2019-08-17 ENCOUNTER — Other Ambulatory Visit: Payer: Self-pay | Admitting: Physician Assistant

## 2019-08-17 MED ORDER — CEPHALEXIN 500 MG PO CAPS
500.0000 mg | ORAL_CAPSULE | Freq: Three times a day (TID) | ORAL | 0 refills | Status: DC
Start: 2019-08-17 — End: 2020-05-17

## 2019-08-17 MED ORDER — FLUCONAZOLE 150 MG PO TABS: 150.0000 mg | ORAL_TABLET | Freq: Once | ORAL | 0 refills | Status: AC

## 2019-08-17 MED FILL — CEPHALEXIN 500 MG CAPSULE: 500 | 7 days supply | Qty: 21 | Fill #0

## 2019-08-17 MED FILL — FLUCONAZOLE 150 MG TABLET: 150 | 1 days supply | Qty: 1 | Fill #0

## 2019-08-18 MED FILL — METFORMIN HCL 500 MG TABS: 500 | 30 days supply | Qty: 30 | Fill #0

## 2019-08-18 MED FILL — GABAPENTIN 300 MG CAPSULE: 300 | 30 days supply | Qty: 30 | Fill #0

## 2019-09-03 ENCOUNTER — Encounter: Payer: Self-pay | Admitting: Internal Medicine

## 2019-09-03 ENCOUNTER — Ambulatory Visit: Payer: Self-pay | Attending: Internal Medicine | Admitting: Internal Medicine

## 2019-09-03 ENCOUNTER — Other Ambulatory Visit: Payer: Self-pay

## 2019-09-03 VITALS — BP 126/82 | HR 62 | Temp 97.5°F | Resp 16 | Ht 66.0 in | Wt 248.8 lb

## 2019-09-03 DIAGNOSIS — R600 Localized edema: Secondary | ICD-10-CM | POA: Insufficient documentation

## 2019-09-03 DIAGNOSIS — Z2821 Immunization not carried out because of patient refusal: Secondary | ICD-10-CM | POA: Insufficient documentation

## 2019-09-03 DIAGNOSIS — E559 Vitamin D deficiency, unspecified: Secondary | ICD-10-CM

## 2019-09-03 DIAGNOSIS — Z6841 Body Mass Index (BMI) 40.0 and over, adult: Secondary | ICD-10-CM

## 2019-09-03 DIAGNOSIS — F1290 Cannabis use, unspecified, uncomplicated: Secondary | ICD-10-CM | POA: Insufficient documentation

## 2019-09-03 DIAGNOSIS — E1169 Type 2 diabetes mellitus with other specified complication: Secondary | ICD-10-CM

## 2019-09-03 DIAGNOSIS — Z0000 Encounter for general adult medical examination without abnormal findings: Secondary | ICD-10-CM

## 2019-09-03 DIAGNOSIS — N644 Mastodynia: Secondary | ICD-10-CM

## 2019-09-03 DIAGNOSIS — R030 Elevated blood-pressure reading, without diagnosis of hypertension: Secondary | ICD-10-CM

## 2019-09-03 HISTORY — DX: Immunization not carried out because of patient refusal: Z28.21

## 2019-09-03 MED ORDER — FUROSEMIDE 20 MG PO TABS: ORAL_TABLET | ORAL | 3 refills | Status: DC

## 2019-09-03 MED FILL — FUROSEMIDE 20 MG TABS: 20 | 70 days supply | Qty: 30 | Fill #0

## 2019-09-03 NOTE — Patient Instructions (Addendum)
Please call the BCCCP (breast and cervical cancer control program) at [phone] to schedule diagnostic mammogram   Obesity, Adult Obesity is having too much body fat. Being obese means that your weight is more than what is healthy for you. BMI is a number that explains how much body fat you have. If you have a BMI of 30 or more, you are obese. Obesity is often caused by eating or drinking more calories than your body uses. Changing your lifestyle can help you lose weight. Obesity can cause serious health problems, such as:  Stroke.  Coronary artery disease (CAD).  Type 2 diabetes.  Some types of cancer, including cancers of the colon, breast, uterus, and gallbladder.  Osteoarthritis.  High blood pressure (hypertension).  High cholesterol.  Sleep apnea.  Gallbladder stones.  Infertility problems. What are the causes?  Eating meals each day that are high in calories, sugar, and fat.  Being born with genes that may make you more likely to become obese.  Having a medical condition that causes obesity.  Taking certain medicines.  Sitting a lot (having a sedentary lifestyle).  Not getting enough sleep.  Drinking a lot of drinks that have sugar in them. What increases the risk?  Having a family history of obesity.  Being an [place] American woman.  Being a Hispanic man.  Living in an area with limited access to: ? Romilda Garret, recreation centers, or sidewalks. ? Healthy food choices, such as grocery stores and farmers' markets. What are the signs or symptoms? The main sign is having too much body fat. How is this treated?  Treatment for this condition often includes changing your lifestyle. Treatment may include: ? Changing your diet. This may include making a healthy meal plan. ? Exercise. This may include activity that causes your heart to beat faster (aerobic exercise) and strength training. Work with your doctor to design a program that works for you. ? Medicine  to help you lose weight. This may be used if you are not able to lose 1 pound a week after 6 weeks of healthy eating and more exercise. ? Treating conditions that cause the obesity. ? Surgery. Options may include gastric banding and gastric bypass. This may be done if:  Other treatments have not helped to improve your condition.  You have a BMI of 40 or higher.  You have life-threatening health problems related to obesity. Follow these instructions at home: Eating and drinking   Follow advice from your doctor about what to eat and drink. Your doctor may tell you to: ? Limit fast food, sweets, and processed snack foods. ? Choose low-fat options. For example, choose low-fat milk instead of whole milk. ? Eat 5 or more servings of fruits or vegetables each day. ? Eat at home more often. This gives you more control over what you eat. ? Choose healthy foods when you eat out. ? Learn to read food labels. This will help you learn how much food is in 1 serving. ? Keep low-fat snacks available. ? Avoid drinks that have a lot of sugar in them. These include soda, fruit juice, iced tea with sugar, and flavored milk.  Drink enough water to keep your pee (urine) pale yellow.  Do not go on fad diets. Physical activity  Exercise often, as told by your doctor. Most adults should get up to 150 minutes of moderate-intensity exercise every week.Ask your doctor: ? What types of exercise are safe for you. ? How often you should exercise.  Warm  up and stretch before being active.  Do slow stretching after being active (cool down).  Rest between times of being active. Lifestyle  Work with your doctor and a food expert (dietitian) to set a weight-loss goal that is best for you.  Limit your screen time.  Find ways to reward yourself that do not involve food.  Do not drink alcohol if: ? Your doctor tells you not to drink. ? You are pregnant, may be pregnant, or are planning to become  pregnant.  If you drink alcohol: ? Limit how much you use to:  0-1 drink a day for women.  0-2 drinks a day for men. ? Be aware of how much alcohol is in your drink. In the [place] one drink equals one 12 oz bottle of beer (355 mL), one 5 oz glass of wine (148 mL), or one 1 oz glass of hard liquor (44 mL). General instructions  Keep a weight-loss journal. This can help you keep track of: ? The food that you eat. ? How much exercise you get.  Take over-the-counter and prescription medicines only as told by your doctor.  Take vitamins and supplements only as told by your doctor.  Think about joining a support group.  Keep all follow-up visits as told by your doctor. This is important. Contact a doctor if:  You cannot meet your weight loss goal after you have changed your diet and lifestyle for 6 weeks. Get help right away if you:  Are having trouble breathing.  Are having thoughts of harming yourself. Summary  Obesity is having too much body fat.  Being obese means that your weight is more than what is healthy for you.  Work with your doctor to set a weight-loss goal.  Get regular exercise as told by your doctor. This information is not intended to replace advice given to you by your health care provider. Make sure you discuss any questions you have with your health care provider. Document Revised: 10/17/2017 Document Reviewed: 10/17/2017 Elsevier Patient Education  2020 [employer].

## 2019-09-03 NOTE — Progress Notes (Signed)
[name], female    DOB: 1981-01-16  MRN: [id number]  CC: Annual Exam   Subjective: Colleen Downs is a 39 y.o. female who presents for physical Her concerns today include:  Patient with history of DM type II, irritable bowel, obesity, bipolar Frustartated that she sees different provider each time she comes  HM:  Does not want COVID or Pneumovax. Over due for eye exam. Decline hep C screen.  Recent urine without protein  DM:  Checks BS every other wk. Range 90-120; reports compliance with taking Metformin. She is wanting to lose weight.  Wants to get to 170-180 lbs She has done a little research and states that she tries to run a "calorie deficit."  Sticking to 2040 cal/day.  Using Fitness app on her phone.  Partially vegetarian time 7 years.  Eats only fish for meat. Does not like fruits, causes diarrhea Walks and was going to gym 4 days a wk for 4 mths but fell off. Plans to get back to gym.  Walks in her neighborhood but not often  BP elev today.  She thinks it is elevated because she was rushing to get here.  No issues with blood pressure in the past  Vit D level low 6.9 on recent visit with PA.  Taking the high dose vit D once a week as prescribed  On last visit with PA she complained of some soreness on the left breast.  She was referred for mammogram.  She tried to call for MMG and can not get through.  Still has a sore area on the breast.  She has a nipple piercing which she has had for a while   Smokes weed every day.  She is wanting to quit.  At one point she had quit for 50 days but then she restarted.  Denies tobacco use or any other street drug use.  Complains of ankle edema x 6 mths or more. First LT ankle. Now just RT.  No shortness of breath  Patient Active Problem List   Diagnosis Date Noted  . Abnormal uterine bleeding 07/29/2017  . Bipolar depression ([place]) 03/19/2017  . Irritable bowel syndrome with both constipation and diarrhea 12/25/2016  .  New onset type 2 diabetes mellitus ([place]) 12/25/2016  . Class 3 severe obesity due to excess calories without serious comorbidity with body mass index (BMI) of 40.0 to 44.9 in adult ([place]) 12/25/2016  . Irregular menses 12/25/2016  . Immunization due 12/24/2016     Current Outpatient Medications on File Prior to Visit  Medication Sig Dispense Refill  . Blood Glucose Monitoring Suppl (TRUE METRIX METER) w/Device KIT Use to check blood sugar as directed. 1 kit 0  . cephALEXin (KEFLEX) 500 MG capsule Take 1 capsule (500 mg total) by mouth 3 (three) times daily. 21 capsule 0  . gabapentin (NEURONTIN) 300 MG capsule Take 1 capsule (300 mg total) by mouth at bedtime. 30 capsule 2  . glucose blood (TRUE METRIX BLOOD GLUCOSE TEST) test strip Use as instructed 100 each 11  . Insulin Pen Needle 31G X 5 MM MISC 1 each by Does not apply route at bedtime. 30 each 5  . metFORMIN (GLUCOPHAGE) 500 MG tablet Take 1 pill as tolerated once daily after the largest meal of the day 30 tablet 2  . naproxen (NAPROSYN) 500 MG tablet Take 1 tablet (500 mg total) by mouth 2 (two) times daily. 14 tablet 0  . nitrofurantoin, macrocrystal-monohydrate, (MACROBID) 100 MG capsule  Take 1 capsule (100 mg total) by mouth 2 (two) times daily. 10 capsule 0  . Norgestimate-Ethinyl Estradiol Triphasic (TRI-SPRINTEC) 0.18/0.215/0.25 MG-35 MCG tablet Take 1 tablet by mouth daily. (Patient not taking: Reported on 04/07/2018) 1 Package 11  . omeprazole (PRILOSEC) 40 MG capsule Take 1 capsule (40 mg total) by mouth daily. To decrease stomach acid (Patient not taking: Reported on 05/07/2018) 30 capsule 3  . TRUEplus Lancets 28G MISC USE AS DIRECTED 100 each 1  . Vitamin D, Ergocalciferol, (DRISDOL) 1.25 MG (50000 UNIT) CAPS capsule Take 1 capsule (50,000 Units total) by mouth every 7 (seven) days. 16 capsule 0   No current facility-administered medications on file prior to visit.    Allergies  Allergen Reactions  . Sulfa Drugs Cross  Reactors Anaphylaxis    Social History   Socioeconomic History  . Marital status: Single    Spouse name: Not on file  . Number of children: Not on file  . Years of education: Not on file  . Highest education level: Not on file  Occupational History  . Not on file  Tobacco Use  . Smoking status: Former Research scientist (life sciences)  . Smokeless tobacco: Never Used  Vaping Use  . Vaping Use: Never used  Substance and Sexual Activity  . Alcohol use: Yes    Comment: social  . Drug use: Yes    Types: Marijuana  . Sexual activity: Not Currently    Birth control/protection: None  Other Topics Concern  . Not on file  Social History Narrative  . Not on file   Social Determinants of Health   Financial Resource Strain:   . Difficulty of Paying Living Expenses:   Food Insecurity:   . Worried About Charity fundraiser in the Last Year:   . Arboriculturist in the Last Year:   Transportation Needs:   . Film/video editor (Medical):   Marland Kitchen Lack of Transportation (Non-Medical):   Physical Activity:   . Days of Exercise per Week:   . Minutes of Exercise per Session:   Stress:   . Feeling of Stress :   Social Connections:   . Frequency of Communication with Friends and Family:   . Frequency of Social Gatherings with Friends and Family:   . Attends Religious Services:   . Active Member of Clubs or Organizations:   . Attends Archivist Meetings:   Marland Kitchen Marital Status:   Intimate Partner Violence:   . Fear of Current or Ex-Partner:   . Emotionally Abused:   Marland Kitchen Physically Abused:   . Sexually Abused:     Family History  Problem Relation Age of Onset  . Rheum arthritis Mother   . Rheum arthritis Sister   . Arthritis Maternal Uncle   . Arthritis Paternal Uncle   . Diabetes Maternal Grandmother     Past Surgical History:  Procedure Laterality Date  . CHOLECYSTECTOMY      ROS: Review of Systems  Constitutional: Negative for fatigue and fever.  HENT: Negative for dental problem,  hearing loss and sore throat.   Eyes: Visual disturbance: problems with distant vision.  Has not had eye exam in yrs.   Respiratory: Negative for chest tightness and shortness of breath.   Cardiovascular: Negative for chest pain.    PHYSICAL EXAM: BP 126/82   Pulse 62   Temp (!) 97.5 F (36.4 C)   Resp 16   Ht '5\' 6"'$  (1.676 m)   Wt 248 lb 12.8 oz (112.9 kg)  SpO2 99%   BMI 40.16 kg/m   Wt Readings from Last 3 Encounters:  09/03/19 248 lb 12.8 oz (112.9 kg)  08/13/19 250 lb (113.4 kg)  08/05/19 250 lb (113.4 kg)    Physical Exam  General appearance - alert, well appearing, obese middle-age African-American female and in no distress Mental status - normal mood, behavior, speech, dress, motor activity, and thought processes Eyes - pupils equal and reactive, extraocular eye movements intact Ears - bilateral TM's and external ear canals normal Nose - normal and patent, no erythema, discharge or polyps Mouth - mucous membranes moist, pharynx normal without lesions Neck - supple, no significant adenopathy Lymphatics - no palpable lymphadenopathy, no hepatosplenomegaly Chest - clear to auscultation, no wheezes, rales or rhonchi, symmetric air entry Heart - normal rate, regular rhythm, normal S1, S2, no murmurs, rubs, clicks or gallops Abdomen - soft, nontender, nondistended, no masses or organomegaly Breasts -CMA Pollock present no axillary lymphadenopathy.  She has a piercing in her left nipple.  No palpable masses or abnormalities felt in either breast.   Extremities -trace to 1+ edema in both lower legs.  1+ edema in the right ankle   CMP Latest Ref Rng & Units 08/13/2019 08/05/2019 05/08/2019  Glucose 65 - 99 mg/dL 91 143(H) 108(H)  BUN 6 - 20 mg/dL 6 10 5(L)  Creatinine 0.57 - 1.00 mg/dL 0.80 0.82 0.77  Sodium 134 - 144 mmol/L 136 134(L) 138  Potassium 3.5 - 5.2 mmol/L 4.2 3.5 4.5  Chloride 96 - 106 mmol/L 101 104 102  CO2 20 - 29 mmol/L '22 22 21  '$ Calcium 8.7 - 10.2 mg/dL 9.4  9.2 9.6  Total Protein 6.0 - 8.5 g/dL - - 7.1  Total Bilirubin 0.0 - 1.2 mg/dL - - 0.2  Alkaline Phos 39 - 117 IU/L - - 37(L)  AST 0 - 40 IU/L - - 21  ALT 0 - 32 IU/L - - 22   Lipid Panel  No results found for: CHOL, TRIG, HDL, CHOLHDL, VLDL, LDLCALC, LDLDIRECT  CBC    Component Value Date/Time   WBC 10.7 (H) 08/05/2019 2354   RBC 4.02 08/05/2019 2354   HGB 12.3 08/05/2019 2354   HGB 12.6 05/08/2019 1434   HCT 37.5 08/05/2019 2354   HCT 36.4 05/08/2019 1434   PLT 365 08/05/2019 2354   PLT 377 04/07/2018 1719   MCV 93.3 08/05/2019 2354   MCV 91 05/08/2019 1434   MCH 30.6 08/05/2019 2354   MCHC 32.8 08/05/2019 2354   RDW 13.4 08/05/2019 2354   RDW 13.4 05/08/2019 1434   LYMPHSABS 3.4 (H) 05/08/2019 1434   MONOABS 0.5 03/29/2018 1512   EOSABS 0.1 05/08/2019 1434   BASOSABS 0.0 05/08/2019 1434    ASSESSMENT AND PLAN: 1. Annual physical exam   2. Type 2 diabetes mellitus with morbid obesity ([place]) Dietary counseling given.  Patient advised to eliminate sugary drinks from the diet, cut back on white carbohydrates, try to incorporate fresh fruits and vegetables into the diet.  She will apply for the orange card/cone discount.  We will refer to nutritionist - Lipid panel - Hemoglobin A1c  3. Class 3 severe obesity due to excess calories with serious comorbidity and body mass index (BMI) of 40.0 to 44.9 in adult [facility]) See dietary counseling above. Advised to aim to get in at least 150 minutes/week of moderate intensity exercise - Amb ref to Medical Nutrition Therapy-MNT  4. Elevated blood-pressure reading, without diagnosis of hypertension DASH diet discussed and encouraged.  Repeat blood pressure was better  5. Vitamin D deficiency On vitamin D supplements  6. 23-polyvalent pneumococcal polysaccharide vaccine declined Offered but patient declined  7. COVID-19 virus vaccination declined Patient absolutely declined getting the Covid vaccine at this time  8. Marijuana  use, continuous Encourage her to cut back on use  9. Edema leg We will give a trial of furosemide to use as needed - furosemide (LASIX) 20 MG tablet; 1 tab PO 2-3 times a week as needed  Dispense: 30 tablet; Refill: 3  10. Soreness breast My CMA will give her phone contact again for her to call to try to get her mammogram scheduled    Patient was given the opportunity to ask questions.  Patient verbalized understanding of the plan and was able to repeat key elements of the plan.   Orders Placed This Encounter  Procedures  . Lipid panel  . Hemoglobin A1c  . Amb ref to Medical Nutrition Therapy-MNT     Requested Prescriptions   Signed Prescriptions Disp Refills  . furosemide (LASIX) 20 MG tablet 30 tablet 3    Sig: 1 tab PO 2-3 times a week as needed    Return in about 4 months (around 01/04/2020).  Karle Plumber, MD, FACP

## 2019-09-04 ENCOUNTER — Other Ambulatory Visit: Payer: Self-pay | Admitting: Internal Medicine

## 2019-09-04 DIAGNOSIS — E1169 Type 2 diabetes mellitus with other specified complication: Secondary | ICD-10-CM | POA: Insufficient documentation

## 2019-09-04 DIAGNOSIS — E782 Mixed hyperlipidemia: Secondary | ICD-10-CM | POA: Insufficient documentation

## 2019-09-04 DIAGNOSIS — E785 Hyperlipidemia, unspecified: Secondary | ICD-10-CM

## 2019-09-04 LAB — LIPID PANEL
Chol/HDL Ratio: 4.9 ratio — ABNORMAL HIGH (ref 0.0–4.4)
Cholesterol, Total: 226 mg/dL — ABNORMAL HIGH (ref 100–199)
HDL: 46 mg/dL (ref >39)
LDL Chol Calc (NIH): 153 mg/dL — ABNORMAL HIGH (ref 0–99)
Triglycerides: 149 mg/dL (ref 0–149)
VLDL Cholesterol Cal: 27 mg/dL (ref 5–40)

## 2019-09-04 LAB — HEMOGLOBIN A1C
Est. average glucose Bld gHb Est-mCnc: 157 mg/dL
Hgb A1c MFr Bld: 7.1 % — ABNORMAL HIGH (ref 4.8–5.6)

## 2019-09-04 MED ORDER — ATORVASTATIN CALCIUM 10 MG PO TABS: 10.0000 mg | ORAL_TABLET | Freq: Every day | ORAL | 3 refills | Status: DC

## 2019-09-04 MED FILL — ATORVASTATIN 10 MG TABLET: 10 | 30 days supply | Qty: 30 | Fill #0

## 2019-09-04 NOTE — Progress Notes (Signed)
Atorvastatin

## 2019-09-06 ENCOUNTER — Emergency Department (HOSPITAL_BASED_OUTPATIENT_CLINIC_OR_DEPARTMENT_OTHER): Payer: Self-pay

## 2019-09-06 ENCOUNTER — Emergency Department (HOSPITAL_BASED_OUTPATIENT_CLINIC_OR_DEPARTMENT_OTHER)
Admission: EM | Admit: 2019-09-06 | Discharge: 2019-09-06 | Disposition: A | Discharge status: Home / Self Care | Payer: Self-pay | Attending: Emergency Medicine | Admitting: Emergency Medicine

## 2019-09-06 ENCOUNTER — Encounter (HOSPITAL_BASED_OUTPATIENT_CLINIC_OR_DEPARTMENT_OTHER): Payer: Self-pay | Admitting: Emergency Medicine

## 2019-09-06 ENCOUNTER — Other Ambulatory Visit: Payer: Self-pay

## 2019-09-06 DIAGNOSIS — Z87891 Personal history of nicotine dependence: Secondary | ICD-10-CM | POA: Insufficient documentation

## 2019-09-06 DIAGNOSIS — K209 Esophagitis, unspecified without bleeding: Secondary | ICD-10-CM | POA: Insufficient documentation

## 2019-09-06 DIAGNOSIS — E1169 Type 2 diabetes mellitus with other specified complication: Secondary | ICD-10-CM | POA: Insufficient documentation

## 2019-09-06 DIAGNOSIS — Z7984 Long term (current) use of oral hypoglycemic drugs: Secondary | ICD-10-CM | POA: Insufficient documentation

## 2019-09-06 DIAGNOSIS — T50905A Adverse effect of unspecified drugs, medicaments and biological substances, initial encounter: Secondary | ICD-10-CM

## 2019-09-06 MED ORDER — FAMOTIDINE 20 MG PO TABS
20.0000 mg | ORAL_TABLET | Freq: Two times a day (BID) | ORAL | 0 refills | Status: DC
Start: 2019-09-06 — End: 2020-01-04

## 2019-09-06 MED ORDER — SUCRALFATE 1 GM/10ML PO SUSP
1.0000 g | ORAL | Status: AC
  Administered 2019-09-06: 1 g via ORAL
  Filled 2019-09-06: qty 10

## 2019-09-06 MED ORDER — LIDOCAINE VISCOUS HCL 2 % MT SOLN
15.0000 mL | Freq: Once | OROMUCOSAL | Status: AC
  Administered 2019-09-06: 15 mL via OROMUCOSAL
  Filled 2019-09-06: qty 15

## 2019-09-06 MED ORDER — LIDOCAINE VISCOUS HCL 2 % MT SOLN
15.0000 mL | OROMUCOSAL | 0 refills | Status: DC | PRN
Start: 2019-09-06 — End: 2020-05-17

## 2019-09-06 NOTE — Discharge Instructions (Addendum)
Eat a soft diet for the next couple of days until symptoms improve.  If you develop any severe chest pain, pain with swallowing or difficulty breathing return to the ER immediately.

## 2019-09-06 NOTE — ED Triage Notes (Signed)
Pt reports sensation of something lodged in throat since 1500. States able to tolerate fluids.

## 2019-09-06 NOTE — ED Provider Notes (Addendum)
[facility] EMERGENCY DEPARTMENT Provider Note   CSN: [id number] Arrival date & time: 09/06/19  0227     History Chief Complaint  Patient presents with  . sore throat that she attributes to pill being stuck    Colleen Downs is a 39 y.o. female.  Patient reports that she has had a sensation of something stuck in her throat since she took a pill around 3 PM today.  No difficulty breathing.  She is able to swallow otherwise.        Past Medical History:  Diagnosis Date  . Diabetes mellitus without complication ([place])   . Diarrhea   . Irregular periods     Patient Active Problem List   Diagnosis Date Noted  . Hyperlipidemia associated with type 2 diabetes mellitus ([place]) 09/04/2019  . Vitamin D deficiency 09/03/2019  . COVID-19 virus vaccination declined 09/03/2019  . Marijuana use, continuous 09/03/2019  . Edema leg 09/03/2019  . Abnormal uterine bleeding 07/29/2017  . Bipolar depression ([place]) 03/19/2017  . Irritable bowel syndrome with both constipation and diarrhea 12/25/2016  . Type 2 diabetes mellitus with morbid obesity ([place]) 12/25/2016  . Class 3 severe obesity due to excess calories with serious comorbidity and body mass index (BMI) of 40.0 to 44.9 in adult ([place]) 12/25/2016  . Irregular menses 12/25/2016  . Immunization due 12/24/2016    Past Surgical History:  Procedure Laterality Date  . CHOLECYSTECTOMY       OB History    Gravida  0   Para  0   Term  0   Preterm  0   AB  0   Living  0     SAB  0   TAB  0   Ectopic  0   Multiple  0   Live Births  0           Family History  Problem Relation Age of Onset  . Rheum arthritis Mother   . Rheum arthritis Sister   . Arthritis Maternal Uncle   . Arthritis Paternal Uncle   . Diabetes Maternal Grandmother     Social History   Tobacco Use  . Smoking status: Former Research scientist (life sciences)  . Smokeless tobacco: Never Used  Vaping Use  . Vaping Use: Never used  Substance Use Topics  .  Alcohol use: Yes    Comment: social  . Drug use: Yes    Types: Marijuana    Home Medications Prior to Admission medications   Medication Sig Start Date End Date Taking? Authorizing Provider  atorvastatin (LIPITOR) 10 MG tablet Take 1 tablet (10 mg total) by mouth daily. 09/04/19   Ladell Pier, MD  Blood Glucose Monitoring Suppl (TRUE METRIX METER) w/Device KIT Use to check blood sugar as directed. 05/20/19   Fulp, Cammie, MD  cephALEXin (KEFLEX) 500 MG capsule Take 1 capsule (500 mg total) by mouth 3 (three) times daily. 08/17/19   Argentina Donovan, PA-C  furosemide (LASIX) 20 MG tablet 1 tab PO 2-3 times a week as needed 09/03/19   Ladell Pier, MD  gabapentin (NEURONTIN) 300 MG capsule Take 1 capsule (300 mg total) by mouth at bedtime. 08/13/19   Argentina Donovan, PA-C  glucose blood (TRUE METRIX BLOOD GLUCOSE TEST) test strip Use as instructed 05/11/19   Fulp, Cammie, MD  Insulin Pen Needle 31G X 5 MM MISC 1 each by Does not apply route at bedtime. 04/02/17   Charlott Rakes, MD  metFORMIN (GLUCOPHAGE) 500 MG tablet Take  1 pill as tolerated once daily after the largest meal of the day 08/13/19   Argentina Donovan, PA-C  naproxen (NAPROSYN) 500 MG tablet Take 1 tablet (500 mg total) by mouth 2 (two) times daily. 08/13/19   Argentina Donovan, PA-C  nitrofurantoin, macrocrystal-monohydrate, (MACROBID) 100 MG capsule Take 1 capsule (100 mg total) by mouth 2 (two) times daily. 08/13/19   Argentina Donovan, PA-C  Norgestimate-Ethinyl Estradiol Triphasic (TRI-SPRINTEC) 0.18/0.215/0.25 MG-35 MCG tablet Take 1 tablet by mouth daily. Patient not taking: Reported on 04/07/2018 11/25/17   Woodroe Mode, MD  omeprazole (PRILOSEC) 40 MG capsule Take 1 capsule (40 mg total) by mouth daily. To decrease stomach acid Patient not taking: Reported on 05/07/2018 04/07/18   Fulp, Ander Gaster, MD  TRUEplus Lancets 28G MISC USE AS DIRECTED 05/11/19   Fulp, Cammie, MD  Vitamin D, Ergocalciferol, (DRISDOL) 1.25 MG (50000  UNIT) CAPS capsule Take 1 capsule (50,000 Units total) by mouth every 7 (seven) days. 08/14/19   Argentina Donovan, PA-C    Allergies    Sulfa drugs cross reactors  Review of Systems   Review of Systems  HENT: Positive for sore throat.   All other systems reviewed and are negative.   Physical Exam Updated Vital Signs BP (!) 142/83 (BP Location: Right Arm)   Pulse 76   Temp 98.3 F (36.8 C) (Oral)   Resp 20   Ht _0  (1.676 m)   Wt 113.4 kg   SpO2 97%   BMI 40.35 kg/m   Physical Exam Vitals and nursing note reviewed.  Constitutional:      General: She is not in acute distress.    Appearance: Normal appearance. She is well-developed.  HENT:     Head: Normocephalic and atraumatic.     Right Ear: Hearing normal.     Left Ear: Hearing normal.     Nose: Nose normal.  Eyes:     Conjunctiva/sclera: Conjunctivae normal.     Pupils: Pupils are equal, round, and reactive to light.  Cardiovascular:     Rate and Rhythm: Regular rhythm.     Heart sounds: S1 normal and S2 normal. No murmur heard.  No friction rub. No gallop.   Pulmonary:     Effort: Pulmonary effort is normal. No respiratory distress.     Breath sounds: Normal breath sounds.  Chest:     Chest wall: No tenderness.  Abdominal:     General: Bowel sounds are normal.     Palpations: Abdomen is soft.     Tenderness: There is no abdominal tenderness. There is no guarding or rebound. Negative signs include Murphy's sign and McBurney's sign.     Hernia: No hernia is present.  Musculoskeletal:        General: Normal range of motion.     Cervical back: Normal range of motion and neck supple.  Skin:    General: Skin is warm and dry.     Findings: No rash.  Neurological:     Mental Status: She is alert and oriented to person, place, and time.     GCS: GCS eye subscore is 4. GCS verbal subscore is 5. GCS motor subscore is 6.     Cranial Nerves: No cranial nerve deficit.     Sensory: No sensory deficit.      Coordination: Coordination normal.  Psychiatric:        Speech: Speech normal.        Behavior: Behavior normal.  Thought Content: Thought content normal.     ED Results / Procedures / Treatments   Labs (all labs ordered are listed, but only abnormal results are displayed) Labs Reviewed - No data to display  EKG None  Radiology DG Neck Soft Tissue  Result Date: 09/06/2019 CLINICAL DATA:  Foreign body sensation. EXAM: NECK SOFT TISSUES - 1+ VIEW COMPARISON:  None. FINDINGS: There is no evidence of retropharyngeal soft tissue swelling or epiglottic enlargement. The cervical airway is unremarkable and no radio-opaque foreign body identified. IMPRESSION: Negative. Electronically Signed   By: Franki Cabot M.D.   On: 09/06/2019 04:00   DG Chest 1 View  Result Date: 09/06/2019 CLINICAL DATA:  Foreign body sensation. EXAM: CHEST  1 VIEW COMPARISON:  Chest x-ray dated 08/05/2019. FINDINGS: Heart size and mediastinal contours are within normal limits. Lungs are clear. Osseous and soft tissue structures about the chest are unremarkable. Nipple ring in place. No other radiopaque foreign body. IMPRESSION: Negative. No evidence of pneumonia or pulmonary edema. No intrathoracic foreign body seen. Electronically Signed   By: Franki Cabot M.D.   On: 09/06/2019 04:01    Procedures Procedures (including critical care time)  Medications Ordered in ED Medications  lidocaine (XYLOCAINE) 2 % viscous mouth solution 15 mL (15 mLs Mouth/Throat Given 09/06/19 0249)  sucralfate (CARAFATE) 1 GM/10ML suspension 1 g (1 g Oral Given 09/06/19 0334)    ED Course  I have reviewed the triage vital signs and the nursing notes.  Pertinent labs & imaging results that were available during my care of the patient were reviewed by me and considered in my medical decision making (see chart for details).    MDM Rules/Calculators/A&P                          Patient experiencing sensation of a pill being stuck  in her throat since she took her medication at 3 PM.  She is tolerating oral intake without difficulty.  This is consistent with a pill esophagitis, no work-up necessary.  Final Clinical Impression(s) / ED Diagnoses Final diagnoses:  Pill esophagitis    Rx / DC Orders ED Discharge Orders    None       Melva Faux, Gwenyth Allegra, MD 09/06/19 [id number]    Orpah Greek, MD 09/06/19 [phone]

## 2019-09-23 MED FILL — VIT D2 1.25 MG (50,000 UNIT: 1.25 MG | 28 days supply | Qty: 4 | Fill #1

## 2019-09-28 MED FILL — METFORMIN HCL 500 MG TABS: 500 | 30 days supply | Qty: 30 | Fill #1

## 2019-10-13 MED FILL — ATORVASTATIN 10 MG TABLET: 10 | 30 days supply | Qty: 30 | Fill #1

## 2019-10-13 MED FILL — VIT D2 1.25 MG (50,000 UNIT: 1.25 MG | 28 days supply | Qty: 4 | Fill #2

## 2019-10-13 MED FILL — GABAPENTIN 300 MG CAPSULE: 300 | 30 days supply | Qty: 30 | Fill #1

## 2019-11-26 ENCOUNTER — Encounter: Payer: Self-pay | Attending: Internal Medicine | Admitting: Dietician

## 2019-12-10 ENCOUNTER — Encounter (HOSPITAL_BASED_OUTPATIENT_CLINIC_OR_DEPARTMENT_OTHER): Payer: Self-pay | Admitting: Emergency Medicine

## 2019-12-10 ENCOUNTER — Emergency Department (HOSPITAL_BASED_OUTPATIENT_CLINIC_OR_DEPARTMENT_OTHER)
Admission: EM | Admit: 2019-12-10 | Discharge: 2019-12-10 | Disposition: A | Discharge status: Home / Self Care | Payer: Self-pay | Attending: Emergency Medicine | Admitting: Emergency Medicine

## 2019-12-10 ENCOUNTER — Other Ambulatory Visit: Payer: Self-pay

## 2019-12-10 DIAGNOSIS — E1169 Type 2 diabetes mellitus with other specified complication: Secondary | ICD-10-CM | POA: Insufficient documentation

## 2019-12-10 DIAGNOSIS — L298 Other pruritus: Secondary | ICD-10-CM | POA: Insufficient documentation

## 2019-12-10 DIAGNOSIS — B373 Candidiasis of vulva and vagina: Secondary | ICD-10-CM

## 2019-12-10 DIAGNOSIS — Z91040 Latex allergy status: Secondary | ICD-10-CM | POA: Insufficient documentation

## 2019-12-10 DIAGNOSIS — Z7984 Long term (current) use of oral hypoglycemic drugs: Secondary | ICD-10-CM | POA: Insufficient documentation

## 2019-12-10 DIAGNOSIS — Z87891 Personal history of nicotine dependence: Secondary | ICD-10-CM | POA: Insufficient documentation

## 2019-12-10 DIAGNOSIS — B3731 Acute candidiasis of vulva and vagina: Secondary | ICD-10-CM

## 2019-12-10 DIAGNOSIS — Z794 Long term (current) use of insulin: Secondary | ICD-10-CM | POA: Insufficient documentation

## 2019-12-10 DIAGNOSIS — E1140 Type 2 diabetes mellitus with diabetic neuropathy, unspecified: Secondary | ICD-10-CM | POA: Insufficient documentation

## 2019-12-10 DIAGNOSIS — M549 Dorsalgia, unspecified: Secondary | ICD-10-CM | POA: Insufficient documentation

## 2019-12-10 DIAGNOSIS — E785 Hyperlipidemia, unspecified: Secondary | ICD-10-CM | POA: Insufficient documentation

## 2019-12-10 DIAGNOSIS — Z79899 Other long term (current) drug therapy: Secondary | ICD-10-CM | POA: Insufficient documentation

## 2019-12-10 DIAGNOSIS — N898 Other specified noninflammatory disorders of vagina: Secondary | ICD-10-CM | POA: Insufficient documentation

## 2019-12-10 HISTORY — DX: Pure hypercholesterolemia, unspecified: E78.00

## 2019-12-10 HISTORY — DX: Type 2 diabetes mellitus with diabetic neuropathy, unspecified: E11.40

## 2019-12-10 LAB — URINALYSIS, ROUTINE W REFLEX MICROSCOPIC
Bilirubin Urine: NEGATIVE
Glucose, UA: NEGATIVE mg/dL
Ketones, ur: NEGATIVE mg/dL
Nitrite: NEGATIVE
Protein, ur: NEGATIVE mg/dL
Specific Gravity, Urine: 1.02 (ref 1.005–1.030)
pH: 7.5 (ref 5.0–8.0)

## 2019-12-10 LAB — WET PREP, GENITAL
Clue Cells Wet Prep HPF POC: NONE SEEN
Sperm: NONE SEEN
Trich, Wet Prep: NONE SEEN

## 2019-12-10 LAB — URINALYSIS, MICROSCOPIC (REFLEX)

## 2019-12-10 LAB — CBG MONITORING, ED: Glucose-Capillary: 145 mg/dL — ABNORMAL HIGH (ref 70–99)

## 2019-12-10 LAB — PREGNANCY, URINE: Preg Test, Ur: NEGATIVE

## 2019-12-10 MED ORDER — FLUCONAZOLE 150 MG PO TABS
150.0000 mg | ORAL_TABLET | Freq: Once | ORAL | Status: AC
  Administered 2019-12-10: 150 mg via ORAL
  Filled 2019-12-10: qty 1

## 2019-12-10 MED ORDER — NYSTATIN 100000 UNIT/GM EX CREA: 1.0000 "application " | TOPICAL_CREAM | Freq: Every day | CUTANEOUS | 0 refills | Status: AC

## 2019-12-10 NOTE — ED Provider Notes (Signed)
[facility] EMERGENCY DEPARTMENT Provider Note   CSN: [id number] Arrival date & time: 12/10/19  1851     History Chief Complaint  Patient presents with  . Vaginal Itching    Colleen Downs is a 39 y.o. female.  Colleen Downs is a 39 year old female who presents to the ED with 2 months of vaginal itching.  She is a previous medical history significant for diabetes.  She reports that she has been having ongoing vaginal itching for roughly the past 2 months.  This is been particularly irritating for her and she has been trying over-the-counter remedies for yeast infections and even took a Diflucan medicine from a friend without significant improvement in her vaginal itching.  In addition to her vaginal itching she has been experiencing some mild lower abdominal pain and back pain which is consistent with her menstrual cycles.  She feels a sense of dryness or tightness to her pelvis.  She denies vaginal discharge but would like to be tested for STIs.  On review of systems, she is negative for fever, nausea, vomiting, chills, shortness of breath.        Past Medical History:  Diagnosis Date  . Diabetes mellitus without complication ([place])   . Diabetic neuropathy ([place])   . Diarrhea   . High cholesterol   . Irregular periods     Patient Active Problem List   Diagnosis Date Noted  . Hyperlipidemia associated with type 2 diabetes mellitus ([place]) 09/04/2019  . Vitamin D deficiency 09/03/2019  . COVID-19 virus vaccination declined 09/03/2019  . Marijuana use, continuous 09/03/2019  . Edema leg 09/03/2019  . Abnormal uterine bleeding 07/29/2017  . Bipolar depression ([place]) 03/19/2017  . Irritable bowel syndrome with both constipation and diarrhea 12/25/2016  . Type 2 diabetes mellitus with morbid obesity ([place]) 12/25/2016  . Class 3 severe obesity due to excess calories with serious comorbidity and body mass index (BMI) of 40.0 to 44.9 in adult ([place]) 12/25/2016  . Irregular  menses 12/25/2016  . Immunization due 12/24/2016    Past Surgical History:  Procedure Laterality Date  . CHOLECYSTECTOMY       OB History    Gravida  0   Para  0   Term  0   Preterm  0   AB  0   Living  0     SAB  0   TAB  0   Ectopic  0   Multiple  0   Live Births  0           Family History  Problem Relation Age of Onset  . Rheum arthritis Mother   . Rheum arthritis Sister   . Arthritis Maternal Uncle   . Arthritis Paternal Uncle   . Diabetes Maternal Grandmother     Social History   Tobacco Use  . Smoking status: Former Research scientist (life sciences)  . Smokeless tobacco: Never Used  Vaping Use  . Vaping Use: Never used  Substance Use Topics  . Alcohol use: Yes    Comment: social  . Drug use: Yes    Types: Marijuana    Home Medications Prior to Admission medications   Medication Sig Start Date End Date Taking? Authorizing Provider  atorvastatin (LIPITOR) 10 MG tablet Take 1 tablet (10 mg total) by mouth daily. 09/04/19   Ladell Pier, MD  Blood Glucose Monitoring Suppl (TRUE METRIX METER) w/Device KIT Use to check blood sugar as directed. 05/20/19   Fulp, Cammie, MD  cephALEXin (KEFLEX) 500 MG  capsule Take 1 capsule (500 mg total) by mouth 3 (three) times daily. 08/17/19   Argentina Donovan, PA-C  famotidine (PEPCID) 20 MG tablet Take 1 tablet (20 mg total) by mouth 2 (two) times daily. 09/06/19   Orpah Greek, MD  furosemide (LASIX) 20 MG tablet 1 tab PO 2-3 times a week as needed 09/03/19   Ladell Pier, MD  gabapentin (NEURONTIN) 300 MG capsule Take 1 capsule (300 mg total) by mouth at bedtime. 08/13/19   Argentina Donovan, PA-C  glucose blood (TRUE METRIX BLOOD GLUCOSE TEST) test strip Use as instructed 05/11/19   Fulp, Cammie, MD  Insulin Pen Needle 31G X 5 MM MISC 1 each by Does not apply route at bedtime. 04/02/17   Charlott Rakes, MD  lidocaine (XYLOCAINE) 2 % solution Use as directed 15 mLs in the mouth or throat every 3 (three) hours as needed  (throat pain). 09/06/19   Orpah Greek, MD  metFORMIN (GLUCOPHAGE) 500 MG tablet Take 1 pill as tolerated once daily after the largest meal of the day 08/13/19   Argentina Donovan, PA-C  naproxen (NAPROSYN) 500 MG tablet Take 1 tablet (500 mg total) by mouth 2 (two) times daily. 08/13/19   Argentina Donovan, PA-C  nitrofurantoin, macrocrystal-monohydrate, (MACROBID) 100 MG capsule Take 1 capsule (100 mg total) by mouth 2 (two) times daily. 08/13/19   Argentina Donovan, PA-C  Norgestimate-Ethinyl Estradiol Triphasic (TRI-SPRINTEC) 0.18/0.215/0.25 MG-35 MCG tablet Take 1 tablet by mouth daily. Patient not taking: Reported on 04/07/2018 11/25/17   Woodroe Mode, MD  omeprazole (PRILOSEC) 40 MG capsule Take 1 capsule (40 mg total) by mouth daily. To decrease stomach acid Patient not taking: Reported on 05/07/2018 04/07/18   Fulp, Ander Gaster, MD  TRUEplus Lancets 28G MISC USE AS DIRECTED 05/11/19   Fulp, Cammie, MD  Vitamin D, Ergocalciferol, (DRISDOL) 1.25 MG (50000 UNIT) CAPS capsule Take 1 capsule (50,000 Units total) by mouth every 7 (seven) days. 08/14/19   Argentina Donovan, PA-C    Allergies    Sulfa drugs cross reactors and Latex  Review of Systems   Review of Systems  Constitutional: Negative for fatigue and fever.  HENT: Negative for sore throat.   Respiratory: Negative for cough, shortness of breath and wheezing.   Cardiovascular: Negative for chest pain.  Gastrointestinal: Positive for abdominal pain. Negative for nausea and vomiting.  Genitourinary: Negative for dysuria and frequency.  Skin: Negative for rash and wound.  Neurological: Negative for headaches.  Psychiatric/Behavioral: Negative for confusion.    Physical Exam Updated Vital Signs BP (!) 146/69 (BP Location: Left Arm)   Pulse 71   Temp 99.9 F (37.7 C) (Oral)   Resp 16   Ht 5' 6" (1.676 m)   Wt 112.5 kg   LMP 11/07/2019   SpO2 99%   BMI 40.03 kg/m   Physical Exam Constitutional:      General: She is not  in acute distress.    Appearance: Normal appearance. She is normal weight.  HENT:     Head: Normocephalic and atraumatic.     Right Ear: External ear normal.     Left Ear: External ear normal.     Nose: Nose normal.     Mouth/Throat:     Mouth: Mucous membranes are moist.     Pharynx: Oropharynx is clear.  Eyes:     Extraocular Movements: Extraocular movements intact.     Conjunctiva/sclera: Conjunctivae normal.     Pupils: Pupils are  equal, round, and reactive to light.  Pulmonary:     Effort: Pulmonary effort is normal.  Abdominal:     General: Abdomen is flat. Bowel sounds are normal.     Palpations: Abdomen is soft.  Genitourinary:    General: Normal vulva.     Vagina: Vaginal discharge present.     Comments: Normal-appearing external genitalia without evidence of lesions.  Pink, moist vaginal mucosa.  Thick white discharge Musculoskeletal:     Cervical back: Normal range of motion.  Neurological:     Mental Status: She is alert.     ED Results / Procedures / Treatments   Labs (all labs ordered are listed, but only abnormal results are displayed) Labs Reviewed  URINALYSIS, ROUTINE W REFLEX MICROSCOPIC - Abnormal; Notable for the following components:      Result Value   APPearance CLOUDY (*)    Hgb urine dipstick LARGE (*)    Leukocytes,Ua LARGE (*)    All other components within normal limits  URINALYSIS, MICROSCOPIC (REFLEX) - Abnormal; Notable for the following components:   Bacteria, UA MANY (*)    All other components within normal limits  CBG MONITORING, ED - Abnormal; Notable for the following components:   Glucose-Capillary 145 (*)    All other components within normal limits  WET PREP, GENITAL  PREGNANCY, URINE  GC/CHLAMYDIA PROBE AMP ([place]) NOT AT [facility]    EKG None  Radiology No results found.  Procedures Procedures (including critical care time)  Medications Ordered in ED Medications - No data to display  ED Course  I have reviewed  the triage vital signs and the nursing notes.  Pertinent labs & imaging results that were available during my care of the patient were reviewed by me and considered in my medical decision making (see chart for details).    MDM Rules/Calculators/A&P                          Ms. Pollok is a 39 year old female who presents to the ED with 2 months of vaginal itching.  She is a previous medical history significant for diabetes.  History is mildly suspicious for atrophic vaginitis.  Pelvic exam was entirely within normal limits with normal external and internal genitalia.  Wet prep was positive for yeast infection.  Will treat with yeast for now and send out GC/committee in addition to HIV, RPR.  In addition to Diflucan, she specifically requests additional treatment and was given a 14-day course vaginal nystatin. Final Clinical Impression(s) / ED Diagnoses Final diagnoses:  None    Rx / DC Orders ED Discharge Orders    None       Matilde Haymaker, MD 12/10/19 2058    Drenda Freeze, MD 12/11/19 2300

## 2019-12-10 NOTE — Discharge Instructions (Addendum)
Your work-up here in the ED was notable for a yeast infection.  We treated you for yeast infection with 1 pill of Diflucan and send you home with a 2-week supply of vaginal nystatin (another antifungal cream).  It is possible that you do have a latex allergy or potentially an allergy to a lubricant that you are using.  It is difficult to determine that based on interaction today.  I recommend that you follow-up with your primary care physician for ongoing care if you do continue to have yeast infections.  I do not think that diabetes is a big contributor at this time although you are correct that that does increase the chance to be getting a urinary tract infection.

## 2019-12-10 NOTE — ED Triage Notes (Signed)
Pt having Vaginal irritation, burning and itching since the beginning of August.  Has tried multiple meds thinking she had a yeast infection but it has not gotten better.  Also sts she is Type 2 Diabetic and has been out of her Metformin for "weeks".

## 2019-12-11 LAB — HIV ANTIBODY (ROUTINE TESTING W REFLEX): HIV Screen 4th Generation wRfx: NONREACTIVE

## 2019-12-11 LAB — RPR: RPR Ser Ql: NONREACTIVE

## 2019-12-11 LAB — GC/CHLAMYDIA PROBE AMP (~~LOC~~) NOT AT ARMC
Chlamydia: NEGATIVE
Comment: NEGATIVE
Comment: NORMAL
Neisseria Gonorrhea: NEGATIVE

## 2019-12-31 ENCOUNTER — Encounter: Payer: Self-pay | Admitting: Dietician

## 2019-12-31 ENCOUNTER — Encounter: Payer: Self-pay | Attending: Internal Medicine | Admitting: Dietician

## 2019-12-31 ENCOUNTER — Other Ambulatory Visit: Payer: Self-pay

## 2019-12-31 DIAGNOSIS — E1169 Type 2 diabetes mellitus with other specified complication: Secondary | ICD-10-CM | POA: Insufficient documentation

## 2019-12-31 NOTE — Patient Instructions (Addendum)
Remember your health when you are making food choices. Be honest with yourself! You know how certain food choices will make you feel!  Take your Vitamin D as recommended. Consider taking a B12 supplement.  Walk 30 minutes a day. Resistance exercise 2-3 days.  Eat 3 meals a day, and have 3 carb choices at each meal (45g carbs). Balance Each meal with your carb/starches, protein, and non starchy vegetables.  Talk to your doctor about fixing your medications.
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
## 2020-01-04 ENCOUNTER — Other Ambulatory Visit: Payer: Self-pay

## 2020-01-04 ENCOUNTER — Other Ambulatory Visit: Payer: Self-pay | Admitting: Family Medicine

## 2020-01-04 ENCOUNTER — Encounter: Payer: Self-pay | Admitting: Family Medicine

## 2020-01-04 ENCOUNTER — Ambulatory Visit: Payer: Self-pay | Attending: Family Medicine | Admitting: Family Medicine

## 2020-01-04 DIAGNOSIS — R197 Diarrhea, unspecified: Secondary | ICD-10-CM

## 2020-01-04 DIAGNOSIS — N750 Cyst of Bartholin's gland: Secondary | ICD-10-CM

## 2020-01-04 DIAGNOSIS — E119 Type 2 diabetes mellitus without complications: Secondary | ICD-10-CM

## 2020-01-04 DIAGNOSIS — N898 Other specified noninflammatory disorders of vagina: Secondary | ICD-10-CM

## 2020-01-04 DIAGNOSIS — E1169 Type 2 diabetes mellitus with other specified complication: Secondary | ICD-10-CM

## 2020-01-04 DIAGNOSIS — R1013 Epigastric pain: Secondary | ICD-10-CM

## 2020-01-04 LAB — POCT GLYCOSYLATED HEMOGLOBIN (HGB A1C): Hemoglobin A1C: 8.1 % — AB (ref 4.0–5.6)

## 2020-01-04 LAB — GLUCOSE, POCT (MANUAL RESULT ENTRY): POC Glucose: 278 mg/dL — AB (ref 70–99)

## 2020-01-04 MED ORDER — TRUE METRIX METER W/DEVICE KIT: PACK | 0 refills | Status: DC

## 2020-01-04 MED ORDER — FAMOTIDINE 20 MG PO TABS: 20.0000 mg | ORAL_TABLET | Freq: Two times a day (BID) | ORAL | 3 refills | Status: DC

## 2020-01-04 MED ORDER — TRUEPLUS LANCETS 28G MISC: 1 refills | Status: DC

## 2020-01-04 MED ORDER — TRUE METRIX BLOOD GLUCOSE TEST VI STRP: ORAL_STRIP | 3 refills | Status: DC

## 2020-01-04 MED ORDER — DAPAGLIFLOZIN PROPANEDIOL 5 MG PO TABS: 5.0000 mg | ORAL_TABLET | Freq: Every day | ORAL | 1 refills | Status: DC

## 2020-01-04 MED FILL — !FARXIGA 5 MG TABLET: 5 | 30 days supply | Qty: 30 | Fill #0

## 2020-01-04 MED FILL — TRUEplus LANCETS 28G MISC: 90 days supply | Qty: 100 | Fill #0

## 2020-01-04 MED FILL — !TRUE METRIX BLOOD GLUCOSE: 1 days supply | Qty: 1 | Fill #0

## 2020-01-04 MED FILL — TRUE METRIX TEST STRIP: 90 days supply | Qty: 100 | Fill #0

## 2020-01-04 MED FILL — FAMOTIDINE 20 MG TABS: 20 | 30 days supply | Qty: 60 | Fill #0

## 2020-01-04 NOTE — Progress Notes (Signed)
Established Patient Office Visit  Subjective:  Patient ID: Skarlet Lyons, female    DOB: [birth year]  Age: 39 y.o. MRN: [id number]  CC: No chief complaint on file.   HPI Sadiya Durand, 39 yo female who is seen in follow-up of chronic medical issues including Type 2 DM and her last Hgb A1c done 09/03/2019 was good at 7.1.  She reports that she has been having issues with recurrent diarrhea/loose stools and stopped the use of Metformin as she felt that this medication contributed to her diarrhea.  Diarrhea typically occurs within 2 hours after eating.  Patient has also had some mid upper abdominal pain.  She was previously on omeprazole but stopped the use of this medication as she also felt that this medication was not helpful and made her feel bad.  She has not been able to check her home blood sugars as she does not have testing supplies.  She has not really been following a diabetic diet.         She reports that she is noticed a growth on the inside of the right wall of her vagina.  The area is not tender.  Patient states that the area is about the size of a peanut.  She does have some occasional mild vaginal discharge.  She denies any abdominal or pelvic pain.  She has had no urinary frequency or dysuria.  She has had prior removal of her gallbladder.  Past Medical History:  Diagnosis Date  . Diabetes mellitus without complication ([place])   . Diabetic neuropathy ([place])   . Diarrhea   . High cholesterol   . Irregular periods     Past Surgical History:  Procedure Laterality Date  . CHOLECYSTECTOMY      Family History  Problem Relation Age of Onset  . Rheum arthritis Mother   . Rheum arthritis Sister   . Arthritis Maternal Uncle   . Arthritis Paternal Uncle   . Diabetes Maternal Grandmother     Social History   Socioeconomic History  . Marital status: Single    Spouse name: Not on file  . Number of children: Not on file  . Years of education: Not on file  . Highest education  level: Not on file  Occupational History  . Not on file  Tobacco Use  . Smoking status: Former Research scientist (life sciences)  . Smokeless tobacco: Never Used  Vaping Use  . Vaping Use: Never used  Substance and Sexual Activity  . Alcohol use: Yes    Comment: social  . Drug use: Yes    Types: Marijuana  . Sexual activity: Not Currently    Birth control/protection: Condom  Other Topics Concern  . Not on file  Social History Narrative  . Not on file   Social Determinants of Health   Financial Resource Strain:   . Difficulty of Paying Living Expenses: Not on file  Food Insecurity:   . Worried About Charity fundraiser in the Last Year: Not on file  . Ran Out of Food in the Last Year: Not on file  Transportation Needs:   . Lack of Transportation (Medical): Not on file  . Lack of Transportation (Non-Medical): Not on file  Physical Activity:   . Days of Exercise per Week: Not on file  . Minutes of Exercise per Session: Not on file  Stress:   . Feeling of Stress : Not on file  Social Connections:   . Frequency of Communication with Friends and Family: Not on  file  . Frequency of Social Gatherings with Friends and Family: Not on file  . Attends Religious Services: Not on file  . Active Member of Clubs or Organizations: Not on file  . Attends Archivist Meetings: Not on file  . Marital Status: Not on file  Intimate Partner Violence:   . Fear of Current or Ex-Partner: Not on file  . Emotionally Abused: Not on file  . Physically Abused: Not on file  . Sexually Abused: Not on file    Outpatient Medications Prior to Visit  Medication Sig Dispense Refill  . atorvastatin (LIPITOR) 10 MG tablet Take 1 tablet (10 mg total) by mouth daily. 90 tablet 3  . Blood Glucose Monitoring Suppl (TRUE METRIX METER) w/Device KIT Use to check blood sugar as directed. 1 kit 0  . cephALEXin (KEFLEX) 500 MG capsule Take 1 capsule (500 mg total) by mouth 3 (three) times daily. 21 capsule 0  . famotidine  (PEPCID) 20 MG tablet Take 1 tablet (20 mg total) by mouth 2 (two) times daily. 30 tablet 0  . furosemide (LASIX) 20 MG tablet 1 tab PO 2-3 times a week as needed 30 tablet 3  . gabapentin (NEURONTIN) 300 MG capsule Take 1 capsule (300 mg total) by mouth at bedtime. 30 capsule 2  . glucose blood (TRUE METRIX BLOOD GLUCOSE TEST) test strip Use as instructed 100 each 11  . Insulin Pen Needle 31G X 5 MM MISC 1 each by Does not apply route at bedtime. 30 each 5  . lidocaine (XYLOCAINE) 2 % solution Use as directed 15 mLs in the mouth or throat every 3 (three) hours as needed (throat pain). 150 mL 0  . metFORMIN (GLUCOPHAGE) 500 MG tablet Take 1 pill as tolerated once daily after the largest meal of the day 30 tablet 2  . naproxen (NAPROSYN) 500 MG tablet Take 1 tablet (500 mg total) by mouth 2 (two) times daily. 14 tablet 0  . nitrofurantoin, macrocrystal-monohydrate, (MACROBID) 100 MG capsule Take 1 capsule (100 mg total) by mouth 2 (two) times daily. 10 capsule 0  . TRUEplus Lancets 28G MISC USE AS DIRECTED 100 each 1  . Vitamin D, Ergocalciferol, (DRISDOL) 1.25 MG (50000 UNIT) CAPS capsule Take 1 capsule (50,000 Units total) by mouth every 7 (seven) days. 16 capsule 0  . Norgestimate-Ethinyl Estradiol Triphasic (TRI-SPRINTEC) 0.18/0.215/0.25 MG-35 MCG tablet Take 1 tablet by mouth daily. (Patient not taking: Reported on 04/07/2018) 1 Package 11  . omeprazole (PRILOSEC) 40 MG capsule Take 1 capsule (40 mg total) by mouth daily. To decrease stomach acid (Patient not taking: Reported on 05/07/2018) 30 capsule 3   No facility-administered medications prior to visit.    Allergies  Allergen Reactions  . Sulfa Drugs Cross Reactors Anaphylaxis  . Latex     ROS Review of Systems  Constitutional: Positive for fatigue. Negative for chills and fever.  HENT: Negative for sore throat and trouble swallowing.   Eyes: Negative for photophobia and visual disturbance.  Respiratory: Negative for cough and  shortness of breath.   Cardiovascular: Negative for chest pain and palpitations.  Gastrointestinal: Positive for abdominal pain and diarrhea. Negative for constipation and nausea.  Endocrine: Positive for polyphagia. Negative for polydipsia and polyuria.  Genitourinary: Positive for vaginal discharge. Negative for dysuria, frequency, pelvic pain and vaginal pain.  Musculoskeletal: Negative for arthralgias and back pain.  Skin: Negative for rash and wound.  Neurological: Negative for dizziness and headaches.  Hematological: Negative for adenopathy. Does not bruise/bleed  easily.  Psychiatric/Behavioral: Positive for sleep disturbance. The patient is nervous/anxious.       Objective:    Physical Exam Vitals and nursing note reviewed. Exam conducted with a chaperone present.  Constitutional:      General: She is not in acute distress.    Appearance: Normal appearance. She is obese.  Cardiovascular:     Rate and Rhythm: Normal rate and regular rhythm.  Pulmonary:     Effort: Pulmonary effort is normal.     Breath sounds: Normal breath sounds.  Abdominal:     Palpations: Abdomen is soft.     Tenderness: There is abdominal tenderness (Epigastric tenderness). There is no right CVA tenderness, left CVA tenderness, guarding or rebound.  Genitourinary:    Vagina: Vaginal discharge present.     Comments: Patient with a nodule/cyst with yellow appearance within the right proximal vaginal canal.  Presence of whitish vaginal discharge.  No CMT. Musculoskeletal:        General: No tenderness.     Cervical back: Neck supple.     Right lower leg: No edema.     Left lower leg: No edema.  Lymphadenopathy:     Cervical: No cervical adenopathy.  Skin:    General: Skin is warm and dry.  Neurological:     General: No focal deficit present.     Mental Status: She is alert and oriented to person, place, and time.  Psychiatric:        Mood and Affect: Mood normal.        Behavior: Behavior normal.       BP 125/85 (BP Location: Left Arm, Patient Position: Sitting)   Pulse 77   Wt 252 lb (114.3 kg)   SpO2 98%   BMI 40.67 kg/m  Wt Readings from Last 3 Encounters:  12/10/19 248 lb (112.5 kg)  09/06/19 250 lb (113.4 kg)  09/03/19 248 lb 12.8 oz (112.9 kg)     Health Maintenance Due  Topic Date Due  . OPHTHALMOLOGY EXAM  Never done  . COVID-19 Vaccine (1) Never done  . URINE MICROALBUMIN  07/30/2018  . INFLUENZA VACCINE  Never done  . PAP SMEAR-Modifier  02/08/2020     Lab Results  Component Value Date   TSH 1.260 05/08/2019   Lab Results  Component Value Date   WBC 10.7 (H) 08/05/2019   HGB 12.3 08/05/2019   HCT 37.5 08/05/2019   MCV 93.3 08/05/2019   PLT 365 08/05/2019   Lab Results  Component Value Date   NA 136 08/13/2019   K 4.2 08/13/2019   CO2 22 08/13/2019   GLUCOSE 91 08/13/2019   BUN 6 08/13/2019   CREATININE 0.80 08/13/2019   BILITOT 0.2 05/08/2019   ALKPHOS 37 (L) 05/08/2019   AST 21 05/08/2019   ALT 22 05/08/2019   PROT 7.1 05/08/2019   ALBUMIN 4.1 05/08/2019   CALCIUM 9.4 08/13/2019   ANIONGAP 8 08/05/2019   Lab Results  Component Value Date   CHOL 226 (H) 09/03/2019   Lab Results  Component Value Date   HDL 46 09/03/2019   Lab Results  Component Value Date   LDLCALC 153 (H) 09/03/2019   Lab Results  Component Value Date   TRIG 149 09/03/2019   Lab Results  Component Value Date   CHOLHDL 4.9 (H) 09/03/2019   Lab Results  Component Value Date   HGBA1C 7.1 (H) 09/03/2019      Assessment & Plan:  1. Type 2 diabetes mellitus with  morbid obesity ([place]) Patient's last hemoglobin A1c in July was 7.1 but at today's visit, patient's hemoglobin A1c is 8.1 with random glucose of 278.  Patient reports that she has not been taking her Metformin as she feels that this may be contributing to her issues with diarrhea.  She has also not been monitoring her home blood sugars.  Patient is provided with diabetes monitoring supplies.  She is  encouraged to keep a blood sugar diary and to follow-up with the clinical pharmacist in the next few weeks regarding her diabetes.  Patient would like a medication other than the Metformin to take for control of her blood sugars.  Prescription provided for Farxiga 5 mg once daily as she has a sulfa allergy and both glipizide and glimepiride could potentially cause a reaction due to her sulfa allergy.  Low carbohydrate/no concentrated sweets diet and regular exercise encouraged.  She will have a urine microalbumin creatinine ratio today's visit along with comprehensive metabolic panel and follow-up of her diabetes.  She will additionally be referred to ophthalmology for diabetic eye exam.  She will also have lipase at today's visit as she also reports issues with epigastric pain. - POCT glycosylated hemoglobin (Hb A1C) - POCT glucose (manual entry) - Microalbumin/Creatinine Ratio, Urine - Comprehensive metabolic panel - Ambulatory referral to Ophthalmology - dapagliflozin propanediol (FARXIGA) 5 MG TABS tablet; Take 1 tablet (5 mg total) by mouth daily before breakfast. To lower blood sugar  Dispense: 30 tablet; Refill: 1 - Blood Glucose Monitoring Suppl (TRUE METRIX METER) w/Device KIT; Use to check blood sugar as directed.  Dispense: 1 kit; Refill: 0 - glucose blood (TRUE METRIX BLOOD GLUCOSE TEST) test strip; Use as instructed to check blood sugars once per day  Dispense: 100 each; Refill: 3 - TRUEplus Lancets 28G MISC; Use when checking blood sugars once per day  Dispense: 100 each; Refill: 1  2. Vaginal discharge Patient with vaginal discharge on pelvic exam and cervicovaginal ancillary testing done.  Patient will be notified of the results and if further treatment is needed based on the results - Cervicovaginal ancillary only  3. Bartholin gland cyst On pelvic exam, patient has what appears to be a Bartholin gland cyst along the right wall of the vaginal canal.  She is being referred to  gynecology for further evaluation and treatment - Ambulatory referral to Gynecology  4. Epigastric pain Patient with complaint of epigastric pain for which she will have lipase level done.  Prescription provided for famotidine 20 mg twice daily to help reduce stomach acid and patient has been referred to gastroenterology for further evaluation and treatment.  Patient had been on omeprazole but discontinued the use of this medication.  Omeprazole can cause diarrhea in some patients. - Ambulatory referral to Gastroenterology - famotidine (PEPCID) 20 MG tablet; Take 1 tablet (20 mg total) by mouth 2 (two) times daily.  Dispense: 60 tablet; Refill: 3 - Lipase  5. Diarrhea, unspecified type Patient with complaint of recurrent episodes of diarrhea which usually occurs a few hours after eating.  Patient has stopped the use of both Metformin and omeprazole without any changes in her diarrhea.  She may continue to take over-the-counter Imodium as needed.  She is also advised to avoid spicy/greasy foods.  She is to be referred to gastroenterology for further evaluation and treatment. - Ambulatory referral to Gastroenterology     Follow-up: Return in about 6 weeks (around 02/15/2020) for DM: 2-3 weeks with Luke/CPP.   Antony Blackbird, MD

## 2020-01-04 NOTE — Progress Notes (Signed)
Has growth in vagina  Diarrhea when eat Stomach always have pain after eating  Has not been taking any meds

## 2020-01-05 ENCOUNTER — Encounter: Payer: Self-pay | Admitting: Gastroenterology

## 2020-01-05 LAB — COMPREHENSIVE METABOLIC PANEL WITH GFR
ALT: 25 IU/L (ref 0–32)
AST: 18 IU/L (ref 0–40)
Albumin/Globulin Ratio: 1.3 (ref 1.2–2.2)
Albumin: 4.2 g/dL (ref 3.8–4.8)
Alkaline Phosphatase: 49 IU/L (ref 44–121)
BUN/Creatinine Ratio: 12 (ref 9–23)
BUN: 8 mg/dL (ref 6–20)
Bilirubin Total: <0.2 mg/dL (ref 0.0–1.2)
CO2: 21 mmol/L (ref 20–29)
Calcium: 9.6 mg/dL (ref 8.7–10.2)
Chloride: 101 mmol/L (ref 96–106)
Creatinine, Ser: 0.67 mg/dL (ref 0.57–1.00)
GFR calc Af Amer: 128 mL/min/1.73
GFR calc non Af Amer: 111 mL/min/1.73
Globulin, Total: 3.2 g/dL (ref 1.5–4.5)
Glucose: 237 mg/dL — ABNORMAL HIGH (ref 65–99)
Potassium: 3.8 mmol/L (ref 3.5–5.2)
Sodium: 135 mmol/L (ref 134–144)
Total Protein: 7.4 g/dL (ref 6.0–8.5)

## 2020-01-05 LAB — LIPASE: Lipase: 32 U/L (ref 14–72)

## 2020-01-05 LAB — MICROALBUMIN / CREATININE URINE RATIO
Creatinine, Urine: 230.5 mg/dL
Microalb/Creat Ratio: 13 mg/g{creat} (ref 0–29)
Microalbumin, Urine: 29.6 ug/mL

## 2020-01-05 MED FILL — VIT D2 1.25 MG (50,000 UNIT: 1.25 MG | 28 days supply | Qty: 4 | Fill #3

## 2020-01-06 LAB — CERVICOVAGINAL ANCILLARY ONLY
Bacterial Vaginitis (gardnerella): NEGATIVE
Candida Glabrata: NEGATIVE
Candida Vaginitis: NEGATIVE
Chlamydia: NEGATIVE
Comment: NEGATIVE
Comment: NEGATIVE
Comment: NEGATIVE
Comment: NEGATIVE
Comment: NEGATIVE
Comment: NORMAL
Neisseria Gonorrhea: NEGATIVE
Trichomonas: NEGATIVE

## 2020-01-18 ENCOUNTER — Other Ambulatory Visit: Payer: Self-pay

## 2020-01-18 ENCOUNTER — Ambulatory Visit: Payer: Self-pay | Attending: Family Medicine | Admitting: Licensed Clinical Social Worker

## 2020-01-18 ENCOUNTER — Institutional Professional Consult (permissible substitution): Payer: Self-pay | Admitting: Licensed Clinical Social Worker

## 2020-01-18 DIAGNOSIS — F331 Major depressive disorder, recurrent, moderate: Secondary | ICD-10-CM

## 2020-01-19 ENCOUNTER — Ambulatory Visit: Payer: Self-pay | Admitting: Pharmacist

## 2020-01-25 NOTE — BH Specialist Note (Signed)
Integrated Behavioral Health Initial In-Person Visit  MRN: [id number] Name: Colleen Downs  Number of [place] Clinician visits:: 1/6 Session Start time: 2:15 PM  Session End time: 2:45 PM Total time: 30 minutes  Types of Service: Individual psychotherapy  Interpretor:No. Interpretor Name and Language: NA   Subjective: Colleen Downs is a 39 y.o. female accompanied by self Patient was referred by Dr. Chapman Fitch for depression. Patient reports the following symptoms/concerns: Pt reports difficulty managing increase in depression and anxiety symptoms triggered by stress Duration of problem: May 2021; Severity of problem: moderate  Objective: Mood: Anxious and Affect: Appropriate Risk of harm to self or others: No plan to harm self or others  Life Context: Family and Social: Pt receives support from family and partner/friend School/Work: Pt is employed and recently began own business Self-Care: Pt is open to initiating therapy and medication management Life Changes: Pt reports increase in tension in the home and is open to obtaining independent housing.   Patient and/or Family's Strengths/Protective Factors: Social connections, Social and Emotional competence, Concrete supports in place (healthy food, safe environments, etc.) and Sense of purpose  Goals Addressed: Patient will: 1. Reduce symptoms of: anxiety and depression Pt agreed to utilize healthy coping skills to manage stress 2. Demonstrate ability to: Increase healthy adjustment to current life circumstances and Increase adequate support systems for patient/family Pt agreed to follow up on resources to strengthen support system Paramedic, [place], [place], Affordable Care Act)  Progress towards Goals: Ongoing  Interventions: Interventions utilized: Solution-Focused Strategies, Supportive Counseling and Link to [employer]  Standardized Assessments  completed: Not Needed  Patient Response: Pt was engaged during session and was successful in identifying healthy coping skills to assist with the management of symptoms.  Patient Centered Plan: Patient is on the following Treatment Plan(s):  Anxiety and Depression  Assessment: Patient currently experiencing increase in depression and anxiety triggered by psychosocial stressors. Pt is interested in obtaining independent housing to minimize strained relationship with father.   Patient may benefit from medication management and therapy. Pt agreed to follow up with [facility] to initiate services.  Plan: 1. Follow up with behavioral health clinician on : Contact LCSW with additional behavioral health and/or resource needs 2. Behavioral recommendations: Utilize strategies discussed and resources provided 3. Referral(s): [facility] (In Clinic) and [facility] (LME/Outside Clinic) 4. "From scale of 1-10, how likely are you to follow plan?":   Rebekah Chesterfield, LCSW 01/25/2020 9:50 AM

## 2020-01-26 ENCOUNTER — Ambulatory Visit: Payer: Self-pay | Admitting: Pharmacist

## 2020-01-28 ENCOUNTER — Ambulatory Visit: Payer: Self-pay | Admitting: Dietician

## 2020-02-01 ENCOUNTER — Ambulatory Visit: Payer: Self-pay | Admitting: Critical Care Medicine

## 2020-02-10 ENCOUNTER — Other Ambulatory Visit: Payer: Self-pay | Admitting: Physician Assistant

## 2020-02-10 MED FILL — FARXIGA 5 MG TABLET: 5 | 30 days supply | Qty: 30 | Fill #1

## 2020-02-10 NOTE — Telephone Encounter (Signed)
Requested medication (s) are due for refill today: Yes  Requested medication (s) are on the active medication list: Yes  Last refill:  08/14/19  Future visit scheduled: No  Notes to clinic:  See request.    Requested Prescriptions  Pending Prescriptions Disp Refills   Vitamin D, Ergocalciferol, (DRISDOL) 1.25 MG (50000 UNIT) CAPS capsule [Pharmacy Med Name: VIT D2 1.25 MG (50,000 UNIT 1.25 MG Capsule] 4 capsule 0    Sig: Take 1 capsule (50,000 Units total) by mouth every 7 (seven) days.      Endocrinology:  Vitamins - Vitamin D Supplementation Failed - 02/10/2020  3:41 PM      Failed - 50,000 IU strengths are not delegated      Failed - Phosphate in normal range and within 360 days    No results found for: PHOS        Failed - Vitamin D in normal range and within 360 days    Vit D, 25-Hydroxy  Date Value Ref Range Status  08/13/2019 6.9 (L) 30.0 - 100.0 ng/mL Final    Comment:    Vitamin D deficiency has been defined by the Institute of Medicine and an Endocrine Society practice guideline as a level of serum 25-OH vitamin D less than 20 ng/mL (1,2). The Endocrine Society went on to further define vitamin D insufficiency as a level between 21 and 29 ng/mL (2). 1. IOM (Institute of Medicine). 2010. Dietary reference    intakes for calcium and D. [place]: The    [employer]. 2. Holick MF, Binkley Bethany, Bischoff-Ferrari HA, et al.    Evaluation, treatment, and prevention of vitamin D    deficiency: an Endocrine Society clinical practice    guideline. JCEM. 2011 Jul; 96(7):1911-30.           Passed - Ca in normal range and within 360 days    Calcium  Date Value Ref Range Status  01/04/2020 9.6 8.7 - 10.2 mg/dL Final          Passed - Valid encounter within last 12 months    Recent Outpatient Visits           1 month ago Type 2 diabetes mellitus with morbid obesity ([place])   [place], [place], MD   5 months ago  Annual physical exam   [place], Neoma Laming B, MD   6 months ago Type 2 diabetes mellitus with hyperglycemia, unspecified whether long term insulin use [facility])   [place], [place], [place]   9 months ago Encounter for screening for [place], [place], NP   1 year ago    [place]

## 2020-02-15 ENCOUNTER — Ambulatory Visit: Payer: Self-pay | Admitting: Family Medicine

## 2020-02-16 ENCOUNTER — Ambulatory Visit: Payer: Self-pay | Admitting: Gastroenterology

## 2020-02-18 ENCOUNTER — Ambulatory Visit: Payer: Self-pay | Admitting: Family Medicine

## 2020-02-25 ENCOUNTER — Encounter: Payer: Self-pay | Admitting: Family Medicine

## 2020-03-21 ENCOUNTER — Other Ambulatory Visit: Payer: Self-pay | Admitting: Pharmacist

## 2020-03-21 DIAGNOSIS — E1169 Type 2 diabetes mellitus with other specified complication: Secondary | ICD-10-CM

## 2020-03-21 MED ORDER — DAPAGLIFLOZIN PROPANEDIOL 5 MG PO TABS: 5.0000 mg | ORAL_TABLET | Freq: Every day | ORAL | 1 refills | Status: DC

## 2020-03-21 MED FILL — FARXIGA 5 MG TABLET: 5 | 30 days supply | Qty: 30 | Fill #0

## 2020-03-31 ENCOUNTER — Ambulatory Visit: Payer: Self-pay | Admitting: Critical Care Medicine

## 2020-03-31 NOTE — Progress Notes (Deleted)
Subjective:    [name], female    DOB: 09/22/1980, 40 y.o.   MRN: [id number]  39 y.o.F here to est PCP   03/31/2020 Pt saw Dr Colleen Downs once in November 2021:  Colleen Downs, 40 yo female who is seen in follow-up of chronic medical issues including Type 2 DM and her last Hgb A1c done 09/03/2019 was good at 7.1.  She reports that she has been having issues with recurrent diarrhea/loose stools and stopped the use of Metformin as she felt that this medication contributed to her diarrhea.  Diarrhea typically occurs within 2 hours after eating.  Patient has also had some mid upper abdominal pain.  She was previously on omeprazole but stopped the use of this medication as she also felt that this medication was not helpful and made her feel bad.  She has not been able to check her home blood sugars as she does not have testing supplies.  She has not really been following a diabetic diet.         She reports that she is noticed a growth on the inside of the right wall of her vagina.  The area is not tender.  Patient states that the area is about the size of a peanut.  She does have some occasional mild vaginal discharge.  She denies any abdominal or pelvic pain.  She has had no urinary frequency or dysuria.  She has had prior removal of her gallbladder.  Type 2 diabetes mellitus with morbid obesity ([place]) Patient's last hemoglobin A1c in July was 7.1 but at today's visit, patient's hemoglobin A1c is 8.1 with random glucose of 278.  Patient reports that she has not been taking her Metformin as she feels that this may be contributing to her issues with diarrhea.  She has also not been monitoring her home blood sugars.  Patient is provided with diabetes monitoring supplies.  She is encouraged to keep a blood sugar diary and to follow-up with the clinical pharmacist in the next few weeks regarding her diabetes.  Patient would like a medication other than the Metformin to take for control of her blood  sugars.  Prescription provided for Farxiga 5 mg once daily as she has a sulfa allergy and both glipizide and glimepiride could potentially cause a reaction due to her sulfa allergy.  Low carbohydrate/no concentrated sweets diet and regular exercise encouraged.  She will have a urine microalbumin creatinine ratio today's visit along with comprehensive metabolic panel and follow-up of her diabetes.  She will additionally be referred to ophthalmology for diabetic eye exam.  She will also have lipase at today's visit as she also reports issues with epigastric pain. - POCT glycosylated hemoglobin (Hb A1C) - POCT glucose (manual entry) - Microalbumin/Creatinine Ratio, Urine - Comprehensive metabolic panel - Ambulatory referral to Ophthalmology - dapagliflozin propanediol (FARXIGA) 5 MG TABS tablet; Take 1 tablet (5 mg total) by mouth daily before breakfast. To lower blood sugar  Dispense: 30 tablet; Refill: 1 - Blood Glucose Monitoring Suppl (TRUE METRIX METER) w/Device KIT; Use to check blood sugar as directed.  Dispense: 1 kit; Refill: 0 - glucose blood (TRUE METRIX BLOOD GLUCOSE TEST) test strip; Use as instructed to check blood sugars once per day  Dispense: 100 each; Refill: 3 - TRUEplus Lancets 28G MISC; Use when checking blood sugars once per day  Dispense: 100 each; Refill: 1  2. Vaginal discharge Patient with vaginal discharge on pelvic exam and cervicovaginal ancillary testing done.  Patient will be notified of the results and if further treatment is needed based on the results - Cervicovaginal ancillary only  3. Bartholin gland cyst On pelvic exam, patient has what appears to be a Bartholin gland cyst along the right wall of the vaginal canal.  She is being referred to gynecology for further evaluation and treatment - Ambulatory referral to Gynecology  4. Epigastric pain Patient with complaint of epigastric pain for which she will have lipase level done.  Prescription provided for  famotidine 20 mg twice daily to help reduce stomach acid and patient has been referred to gastroenterology for further evaluation and treatment.  Patient had been on omeprazole but discontinued the use of this medication.  Omeprazole can cause diarrhea in some patients. - Ambulatory referral to Gastroenterology - famotidine (PEPCID) 20 MG tablet; Take 1 tablet (20 mg total) by mouth 2 (two) times daily.  Dispense: 60 tablet; Refill: 3 - Lipase  5. Diarrhea, unspecified type Patient with complaint of recurrent episodes of diarrhea which usually occurs a few hours after eating.  Patient has stopped the use of both Metformin and omeprazole without any changes in her diarrhea.  She may continue to take over-the-counter Imodium as needed.  She is also advised to avoid spicy/greasy foods.  She is to be referred to gastroenterology for further evaluation and treatment. - Ambulatory referral to Gastroenterology       Review of Systems     Objective:   Physical Exam        Assessment & Plan:

## 2020-05-17 ENCOUNTER — Other Ambulatory Visit: Payer: Self-pay | Admitting: Critical Care Medicine

## 2020-05-17 ENCOUNTER — Encounter: Payer: Self-pay | Admitting: Critical Care Medicine

## 2020-05-17 ENCOUNTER — Other Ambulatory Visit: Payer: Self-pay

## 2020-05-17 ENCOUNTER — Ambulatory Visit: Payer: Self-pay | Attending: Critical Care Medicine | Admitting: Critical Care Medicine

## 2020-05-17 DIAGNOSIS — N750 Cyst of Bartholin's gland: Secondary | ICD-10-CM

## 2020-05-17 DIAGNOSIS — Z124 Encounter for screening for malignant neoplasm of cervix: Secondary | ICD-10-CM

## 2020-05-17 DIAGNOSIS — E785 Hyperlipidemia, unspecified: Secondary | ICD-10-CM

## 2020-05-17 DIAGNOSIS — E1169 Type 2 diabetes mellitus with other specified complication: Secondary | ICD-10-CM

## 2020-05-17 DIAGNOSIS — B9689 Other specified bacterial agents as the cause of diseases classified elsewhere: Secondary | ICD-10-CM

## 2020-05-17 DIAGNOSIS — E559 Vitamin D deficiency, unspecified: Secondary | ICD-10-CM

## 2020-05-17 DIAGNOSIS — N926 Irregular menstruation, unspecified: Secondary | ICD-10-CM

## 2020-05-17 DIAGNOSIS — L089 Local infection of the skin and subcutaneous tissue, unspecified: Secondary | ICD-10-CM

## 2020-05-17 DIAGNOSIS — E1165 Type 2 diabetes mellitus with hyperglycemia: Secondary | ICD-10-CM

## 2020-05-17 MED ORDER — TRUE METRIX BLOOD GLUCOSE TEST VI STRP: ORAL_STRIP | 3 refills | Status: DC

## 2020-05-17 MED ORDER — CEPHALEXIN 500 MG PO CAPS: 500.0000 mg | ORAL_CAPSULE | Freq: Four times a day (QID) | ORAL | 0 refills | Status: DC

## 2020-05-17 MED ORDER — TRUEPLUS LANCETS 28G MISC: 1 refills | Status: DC

## 2020-05-17 MED ORDER — DAPAGLIFLOZIN PROPANEDIOL 10 MG PO TABS
10.0000 mg | ORAL_TABLET | Freq: Every day | ORAL | 6 refills | Status: DC
  Filled 2020-06-22: qty 30, 30d supply, fill #0
  Filled 2020-08-04: qty 30, 30d supply, fill #1

## 2020-05-17 MED ORDER — VITAMIN D (ERGOCALCIFEROL) 1.25 MG (50000 UNIT) PO CAPS
50000.0000 [IU] | ORAL_CAPSULE | ORAL | 0 refills | Status: DC
Start: 2020-05-17 — End: 2020-05-17

## 2020-05-17 MED ORDER — TRUE METRIX METER W/DEVICE KIT: PACK | 0 refills | Status: DC

## 2020-05-17 MED ORDER — ATORVASTATIN CALCIUM 10 MG PO TABS
10.0000 mg | ORAL_TABLET | Freq: Every day | ORAL | 3 refills | Status: DC
Start: 2020-05-17 — End: 2020-05-17

## 2020-05-17 NOTE — Assessment & Plan Note (Signed)
Referral to gynecology

## 2020-05-17 NOTE — Assessment & Plan Note (Signed)
Bacterial skin infection administer Keflex for 7-day course

## 2020-05-17 NOTE — Assessment & Plan Note (Signed)
Refill vitamin D supplement

## 2020-05-17 NOTE — Assessment & Plan Note (Signed)
Refill atorvastatin

## 2020-05-17 NOTE — Progress Notes (Deleted)
Established Patient Office Visit  Subjective:  [name], female    DOB: 29-Feb-1980  Age: 40 y.o. MRN: [id number]  CC: No chief complaint on file.   HPI Colleen Downs presents for ***  Past Medical History:  Diagnosis Date  . Diabetes mellitus without complication ([place])   . Diabetic neuropathy ([place])   . Diarrhea   . High cholesterol   . Irregular periods     Past Surgical History:  Procedure Laterality Date  . CHOLECYSTECTOMY      Family History  Problem Relation Age of Onset  . Rheum arthritis Mother   . Rheum arthritis Sister   . Arthritis Maternal Uncle   . Arthritis Paternal Uncle   . Diabetes Maternal Grandmother     Social History   Socioeconomic History  . Marital status: Single    Spouse name: Not on file  . Number of children: Not on file  . Years of education: Not on file  . Highest education level: Not on file  Occupational History  . Not on file  Tobacco Use  . Smoking status: Former Research scientist (life sciences)  . Smokeless tobacco: Never Used  Vaping Use  . Vaping Use: Never used  Substance and Sexual Activity  . Alcohol use: Yes    Comment: social  . Drug use: Yes    Types: Marijuana  . Sexual activity: Not Currently    Birth control/protection: Condom  Other Topics Concern  . Not on file  Social History Narrative  . Not on file   Social Determinants of Health   Financial Resource Strain: Not on file  Food Insecurity: Not on file  Transportation Needs: Not on file  Physical Activity: Not on file  Stress: Not on file  Social Connections: Not on file  Intimate Partner Violence: Not on file    Outpatient Medications Prior to Visit  Medication Sig Dispense Refill  . atorvastatin (LIPITOR) 10 MG tablet Take 1 tablet (10 mg total) by mouth daily. (Patient not taking: Reported on 01/04/2020) 90 tablet 3  . Blood Glucose Monitoring Suppl (TRUE METRIX METER) w/Device KIT Use to check blood sugar as directed. 1 kit 0  . cephALEXin (KEFLEX) 500  MG capsule Take 1 capsule (500 mg total) by mouth 3 (three) times daily. (Patient not taking: Reported on 01/04/2020) 21 capsule 0  . dapagliflozin propanediol (FARXIGA) 5 MG TABS tablet Take 1 tablet (5 mg total) by mouth daily before breakfast. To lower blood sugar 30 tablet 1  . famotidine (PEPCID) 20 MG tablet Take 1 tablet (20 mg total) by mouth 2 (two) times daily. 60 tablet 3  . furosemide (LASIX) 20 MG tablet 1 tab PO 2-3 times a week as needed (Patient not taking: Reported on 01/04/2020) 30 tablet 3  . gabapentin (NEURONTIN) 300 MG capsule Take 1 capsule (300 mg total) by mouth at bedtime. (Patient not taking: Reported on 01/04/2020) 30 capsule 2  . glucose blood (TRUE METRIX BLOOD GLUCOSE TEST) test strip Use as instructed to check blood sugars once per day 100 each 3  . Insulin Pen Needle 31G X 5 MM MISC 1 each by Does not apply route at bedtime. (Patient not taking: Reported on 01/04/2020) 30 each 5  . Lancets MISC See admin instructions.    . lidocaine (XYLOCAINE) 2 % solution Use as directed 15 mLs in the mouth or throat every 3 (three) hours as needed (throat pain). (Patient not taking: Reported on 01/04/2020) 150 mL 0  . metFORMIN (GLUCOPHAGE)  500 MG tablet Take 1 pill as tolerated once daily after the largest meal of the day (Patient not taking: Reported on 01/04/2020) 30 tablet 2  . naproxen (NAPROSYN) 500 MG tablet Take 1 tablet (500 mg total) by mouth 2 (two) times daily. (Patient not taking: Reported on 01/04/2020) 14 tablet 0  . nitrofurantoin, macrocrystal-monohydrate, (MACROBID) 100 MG capsule Take 1 capsule (100 mg total) by mouth 2 (two) times daily. (Patient not taking: Reported on 01/04/2020) 10 capsule 0  . Norgestimate-Ethinyl Estradiol Triphasic (TRI-SPRINTEC) 0.18/0.215/0.25 MG-35 MCG tablet Take 1 tablet by mouth daily. (Patient not taking: Reported on 04/07/2018) 1 Package 11  . omeprazole (PRILOSEC) 40 MG capsule Take 1 capsule (40 mg total) by mouth daily. To decrease stomach  acid (Patient not taking: Reported on 05/07/2018) 30 capsule 3  . TRUEplus Lancets 28G MISC Use when checking blood sugars once per day 100 each 1  . Vitamin D, Ergocalciferol, (DRISDOL) 1.25 MG (50000 UNIT) CAPS capsule Take 1 capsule (50,000 Units total) by mouth every 7 (seven) days. (Patient not taking: Reported on 01/04/2020) 16 capsule 0   No facility-administered medications prior to visit.    Allergies  Allergen Reactions  . Sulfa Drugs Cross Reactors Anaphylaxis  . Latex     ROS Review of Systems    Objective:    Physical Exam  There were no vitals taken for this visit. Wt Readings from Last 3 Encounters:  01/04/20 114.3 kg  12/10/19 112.5 kg  09/06/19 113.4 kg     Health Maintenance Due  Topic Date Due  . COVID-19 Vaccine (1) Never done  . OPHTHALMOLOGY EXAM  Never done  . INFLUENZA VACCINE  Never done  . PAP SMEAR-Modifier  02/08/2020    There are no preventive care reminders to display for this patient.  Lab Results  Component Value Date   TSH 1.260 05/08/2019   Lab Results  Component Value Date   WBC 10.7 (H) 08/05/2019   HGB 12.3 08/05/2019   HCT 37.5 08/05/2019   MCV 93.3 08/05/2019   PLT 365 08/05/2019   Lab Results  Component Value Date   NA 135 01/04/2020   K 3.8 01/04/2020   CO2 21 01/04/2020   GLUCOSE 237 (H) 01/04/2020   BUN 8 01/04/2020   CREATININE 0.67 01/04/2020   BILITOT <0.2 01/04/2020   ALKPHOS 49 01/04/2020   AST 18 01/04/2020   ALT 25 01/04/2020   PROT 7.4 01/04/2020   ALBUMIN 4.2 01/04/2020   CALCIUM 9.6 01/04/2020   ANIONGAP 8 08/05/2019   Lab Results  Component Value Date   CHOL 226 (H) 09/03/2019   Lab Results  Component Value Date   HDL 46 09/03/2019   Lab Results  Component Value Date   LDLCALC 153 (H) 09/03/2019   Lab Results  Component Value Date   TRIG 149 09/03/2019   Lab Results  Component Value Date   CHOLHDL 4.9 (H) 09/03/2019   Lab Results  Component Value Date   HGBA1C 8.1 (A)  01/04/2020      Assessment & Plan:   Problem List Items Addressed This Visit   None     No orders of the defined types were placed in this encounter.   Follow-up: No follow-ups on file.    Kathlyn [place], RN

## 2020-05-17 NOTE — Assessment & Plan Note (Signed)
History of Bartholin cyst  Referral to gynecology

## 2020-05-17 NOTE — Assessment & Plan Note (Addendum)
Type 2 diabetes not well controlled off all medications  Begin Farxiga 10 mg daily  Glucose testing supplies given to the patient  Obtain CBC and complete metabolic panel Obtain eye exam

## 2020-05-17 NOTE — Assessment & Plan Note (Signed)
Refer to gynecology for cancer screening Pap smear

## 2020-05-17 NOTE — Patient Instructions (Signed)
Antibiotic given for infections of the skin take it 4 times daily  Please obtain an eye exam this year to screen for retinal damage of the eyes  Gynecology referral made for a Pap smear and pelvic exam  Refill on Farxiga given have increased the dose to 10 mg daily  Refills on your insulin testing supplies and a new diabetic blood glucose kit sent to the pharmacy  Refills on vitamin D sent and also a prescription for atorvastatin for cholesterol  Financial assistance paperwork will be given  Appointment with Lurena Joiner our clinical pharmacist will be made in the next 2 weeks to go over your diabetes care return to see Dr. Joya Gaskins in 6 weeks

## 2020-05-17 NOTE — Progress Notes (Signed)
Subjective:    [name], female    DOB: [birth year], 40 y.o.   MRN: [id number]  39 y.o.F T2DM  HLD  Obese  Former fulp pt  05/17/2020 This patient is seen for the first time since November 2021 when she was last seen by Dr. Chapman Fitch.  Is a former primary care patient of Dr. Chapman Fitch now here to establish.  She had difficulty getting into the clinic and getting back in his run out of all her medications occluding all her diabetic medicines.  On arrival A1c is 7.9.  Blood pressure 129/80.  She complains of boils on the back of her neck left ear and lower abdomen.  She is out of her vitamin D and has severe vitamin D deficiency.  She is needing financial assistance as well.  Also she is due up a Pap smear.  Patient states that the vaginal discharge she had previously was unchanged but now has resolved  Below is documentation from the last note from Dr. Chapman Fitch in 2021 Type 2 diabetes mellitus with morbid obesity ([place]) Patient's last hemoglobin A1c in July was 7.1 but at today's visit, patient's hemoglobin A1c is 8.1 with random glucose of 278.  Patient reports that she has not been taking her Metformin as she feels that this may be contributing to her issues with diarrhea.  She has also not been monitoring her home blood sugars.  Patient is provided with diabetes monitoring supplies.  She is encouraged to keep a blood sugar diary and to follow-up with the clinical pharmacist in the next few weeks regarding her diabetes.  Patient would like a medication other than the Metformin to take for control of her blood sugars.  Prescription provided for Farxiga 5 mg once daily as she has a sulfa allergy and both glipizide and glimepiride could potentially cause a reaction due to her sulfa allergy.  Low carbohydrate/no concentrated sweets diet and regular exercise encouraged.  She will have a urine microalbumin creatinine ratio today's visit along with comprehensive metabolic panel and follow-up of her diabetes.   She will additionally be referred to ophthalmology for diabetic eye exam.  She will also have lipase at today's visit as she also reports issues with epigastric pain. - POCT glycosylated hemoglobin (Hb A1C) - POCT glucose (manual entry) - Microalbumin/Creatinine Ratio, Urine - Comprehensive metabolic panel - Ambulatory referral to Ophthalmology - dapagliflozin propanediol (FARXIGA) 5 MG TABS tablet; Take 1 tablet (5 mg total) by mouth daily before breakfast. To lower blood sugar  Dispense: 30 tablet; Refill: 1 - Blood Glucose Monitoring Suppl (TRUE METRIX METER) w/Device KIT; Use to check blood sugar as directed.  Dispense: 1 kit; Refill: 0 - glucose blood (TRUE METRIX BLOOD GLUCOSE TEST) test strip; Use as instructed to check blood sugars once per day  Dispense: 100 each; Refill: 3 - TRUEplus Lancets 28G MISC; Use when checking blood sugars once per day  Dispense: 100 each; Refill: 1  2. Vaginal discharge Patient with vaginal discharge on pelvic exam and cervicovaginal ancillary testing done.  Patient will be notified of the results and if further treatment is needed based on the results - Cervicovaginal ancillary only  3. Bartholin gland cyst On pelvic exam, patient has what appears to be a Bartholin gland cyst along the right wall of the vaginal canal.  She is being referred to gynecology for further evaluation and treatment - Ambulatory referral to Gynecology  4. Epigastric pain Patient with complaint of epigastric pain for which she  will have lipase level done.  Prescription provided for famotidine 20 mg twice daily to help reduce stomach acid and patient has been referred to gastroenterology for further evaluation and treatment.  Patient had been on omeprazole but discontinued the use of this medication.  Omeprazole can cause diarrhea in some patients. - Ambulatory referral to Gastroenterology - famotidine (PEPCID) 20 MG tablet; Take 1 tablet (20 mg total) by mouth 2 (two) times  daily.  Dispense: 60 tablet; Refill: 3 - Lipase  5. Diarrhea, unspecified type Patient with complaint of recurrent episodes of diarrhea which usually occurs a few hours after eating.  Patient has stopped the use of both Metformin and omeprazole without any changes in her diarrhea.  She may continue to take over-the-counter Imodium as needed.  She is also advised to avoid spicy/greasy foods.  She is to be referred to gastroenterology for further evaluation and treatment. - Ambulatory referral to Gastroenterology   The patient actually never got in with gastroenterology but her diarrhea now has resolved itself.  Patient is requesting financial assistance and assistance with her medications.   Past Medical History:  Diagnosis Date  . Diabetes mellitus without complication ([place])   . Diabetic neuropathy ([place])   . Diarrhea   . High cholesterol   . Irregular periods      Family History  Problem Relation Age of Onset  . Rheum arthritis Mother   . Rheum arthritis Sister   . Arthritis Maternal Uncle   . Arthritis Paternal Uncle   . Diabetes Maternal Grandmother      Social History   Socioeconomic History  . Marital status: Single    Spouse name: Not on file  . Number of children: Not on file  . Years of education: Not on file  . Highest education level: Not on file  Occupational History  . Not on file  Tobacco Use  . Smoking status: Former Research scientist (life sciences)  . Smokeless tobacco: Never Used  Vaping Use  . Vaping Use: Never used  Substance and Sexual Activity  . Alcohol use: Yes    Comment: social  . Drug use: Yes    Types: Marijuana  . Sexual activity: Not Currently    Birth control/protection: Condom  Other Topics Concern  . Not on file  Social History Narrative  . Not on file   Social Determinants of Health   Financial Resource Strain: Not on file  Food Insecurity: Not on file  Transportation Needs: Not on file  Physical Activity: Not on file  Stress: Not on file  Social  Connections: Not on file  Intimate Partner Violence: Not on file     Allergies  Allergen Reactions  . Sulfa Drugs Cross Reactors Anaphylaxis  . Latex      Outpatient Medications Prior to Visit  Medication Sig Dispense Refill  . atorvastatin (LIPITOR) 10 MG tablet Take 1 tablet (10 mg total) by mouth daily. (Patient not taking: No sig reported) 90 tablet 3  . Blood Glucose Monitoring Suppl (TRUE METRIX METER) w/Device KIT Use to check blood sugar as directed. (Patient not taking: Reported on 05/17/2020) 1 kit 0  . cephALEXin (KEFLEX) 500 MG capsule Take 1 capsule (500 mg total) by mouth 3 (three) times daily. (Patient not taking: No sig reported) 21 capsule 0  . dapagliflozin propanediol (FARXIGA) 5 MG TABS tablet Take 1 tablet (5 mg total) by mouth daily before breakfast. To lower blood sugar (Patient not taking: Reported on 05/17/2020) 30 tablet 1  . famotidine (PEPCID)  20 MG tablet Take 1 tablet (20 mg total) by mouth 2 (two) times daily. (Patient not taking: Reported on 05/17/2020) 60 tablet 3  . furosemide (LASIX) 20 MG tablet 1 tab PO 2-3 times a week as needed (Patient not taking: No sig reported) 30 tablet 3  . gabapentin (NEURONTIN) 300 MG capsule Take 1 capsule (300 mg total) by mouth at bedtime. (Patient not taking: No sig reported) 30 capsule 2  . glucose blood (TRUE METRIX BLOOD GLUCOSE TEST) test strip Use as instructed to check blood sugars once per day (Patient not taking: Reported on 05/17/2020) 100 each 3  . Insulin Pen Needle 31G X 5 MM MISC 1 each by Does not apply route at bedtime. (Patient not taking: No sig reported) 30 each 5  . Lancets MISC See admin instructions. (Patient not taking: Reported on 05/17/2020)    . lidocaine (XYLOCAINE) 2 % solution Use as directed 15 mLs in the mouth or throat every 3 (three) hours as needed (throat pain). (Patient not taking: No sig reported) 150 mL 0  . metFORMIN (GLUCOPHAGE) 500 MG tablet Take 1 pill as tolerated once daily after the  largest meal of the day (Patient not taking: No sig reported) 30 tablet 2  . naproxen (NAPROSYN) 500 MG tablet Take 1 tablet (500 mg total) by mouth 2 (two) times daily. (Patient not taking: No sig reported) 14 tablet 0  . nitrofurantoin, macrocrystal-monohydrate, (MACROBID) 100 MG capsule Take 1 capsule (100 mg total) by mouth 2 (two) times daily. (Patient not taking: No sig reported) 10 capsule 0  . Norgestimate-Ethinyl Estradiol Triphasic (TRI-SPRINTEC) 0.18/0.215/0.25 MG-35 MCG tablet Take 1 tablet by mouth daily. (Patient not taking: No sig reported) 1 Package 11  . omeprazole (PRILOSEC) 40 MG capsule Take 1 capsule (40 mg total) by mouth daily. To decrease stomach acid (Patient not taking: No sig reported) 30 capsule 3  . TRUEplus Lancets 28G MISC Use when checking blood sugars once per day (Patient not taking: Reported on 05/17/2020) 100 each 1  . Vitamin D, Ergocalciferol, (DRISDOL) 1.25 MG (50000 UNIT) CAPS capsule Take 1 capsule (50,000 Units total) by mouth every 7 (seven) days. (Patient not taking: No sig reported) 16 capsule 0   No facility-administered medications prior to visit.     Review of Systems  Constitutional: Positive for fatigue.  HENT: Negative.   Respiratory: Negative.   Cardiovascular: Negative.   Gastrointestinal: Negative.   Endocrine: Positive for polydipsia, polyphagia and polyuria.  Musculoskeletal: Negative.   Skin: Positive for wound.  Neurological: Negative.   Hematological: Negative.   Psychiatric/Behavioral: Negative.        Objective:   Physical Exam Vitals:   05/17/20 1641  BP: 129/80  Pulse: 80  Resp: 16  SpO2: 98%  Weight: 246 lb 3.2 oz (111.7 kg)    Gen: Pleasant, well-nourished, in no distress,  normal affect  ENT: No lesions,  mouth clear,  oropharynx clear, no postnasal drip  Neck: No JVD, no TMG, no carotid bruits  Lungs: No use of accessory muscles, no dullness to percussion, clear without rales or rhonchi  Cardiovascular:  RRR, heart sounds normal, no murmur or gallops, no peripheral edema  Abdomen: soft and NT, no HSM,  BS normal  Musculoskeletal: No deformities, no cyanosis or clubbing  Neuro: alert, non focal  Skin: Warm, subcutaneous boils over the right upper shoulder left anterior ear and suprapubic area  Hemoglobin A1c 7.9      Assessment & Plan:  I personally reviewed  all images and lab data in the [facility] system as well as any outside material available during this office visit and agree with the  radiology impressions.   Type 2 diabetes mellitus with morbid obesity ([facility]) Type 2 diabetes not well controlled off all medications  Begin Farxiga 10 mg daily  Glucose testing supplies given to the patient  Obtain CBC and complete metabolic panel Obtain eye exam  Skin infection, bacterial Bacterial skin infection administer Keflex for 7-day course  Bartholin cyst History of Bartholin cyst  Referral to gynecology  Irregular menses Referral to gynecology  Vitamin D deficiency Refill vitamin D supplement  Cervical cancer screening Refer to gynecology for cancer screening Pap smear  Hyperlipidemia associated with type 2 diabetes mellitus ([place]) Refill atorvastatin   Colleen Downs was seen today for re-estasblish and diabetes.  Diagnoses and all orders for this visit:  Type 2 diabetes mellitus with morbid obesity ([place]) -     POCT glucose (manual entry) -     POCT glycosylated hemoglobin (Hb A1C) -     Blood Glucose Monitoring Suppl (TRUE METRIX METER) w/Device KIT; Use to check blood sugar as directed. -     dapagliflozin propanediol (FARXIGA) 10 MG TABS tablet; Take 1 tablet (10 mg total) by mouth daily before breakfast. To lower blood sugar -     glucose blood (TRUE METRIX BLOOD GLUCOSE TEST) test strip; Use as instructed to check blood sugars once per day -     TRUEplus Lancets 28G MISC; Use when checking blood sugars once per day  Type 2 diabetes mellitus with hyperglycemia, unspecified  whether long term insulin use (HCC) -     CBC with Differential/Platelet -     Comprehensive metabolic panel  Skin infection, bacterial -     CBC with Differential/Platelet  Cervical cancer screening -     Ambulatory referral to Gynecology  Bartholin cyst -     Ambulatory referral to Gynecology  Irregular menses  Vitamin D deficiency  Hyperlipidemia associated with type 2 diabetes mellitus ([place])  Other orders -     atorvastatin (LIPITOR) 10 MG tablet; Take 1 tablet (10 mg total) by mouth daily. -     Vitamin D, Ergocalciferol, (DRISDOL) 1.25 MG (50000 UNIT) CAPS capsule; Take 1 capsule (50,000 Units total) by mouth every 7 (seven) days. -     cephALEXin (KEFLEX) 500 MG capsule; Take 1 capsule (500 mg total) by mouth 4 (four) times daily.

## 2020-05-18 LAB — GLUCOSE, POCT (MANUAL RESULT ENTRY): POC Glucose: 155 mg/dl — AB (ref 70–99)

## 2020-05-18 LAB — POCT GLYCOSYLATED HEMOGLOBIN (HGB A1C): HbA1c, POC (controlled diabetic range): 7.9 % — AB (ref 0.0–7.0)

## 2020-05-18 MED FILL — CEPHALEXIN 500 MG CAPSULE: 500 | 7 days supply | Qty: 28 | Fill #0

## 2020-05-18 MED FILL — ATORVASTATIN 10 MG TABLET: 10 | 30 days supply | Qty: 30 | Fill #0

## 2020-05-18 MED FILL — !TRUE METRIX BLOOD GLUCOSE: 30 days supply | Qty: 1 | Fill #0

## 2020-05-18 MED FILL — VIT D2 1.25 MG (50,000 UNIT: 1.25 MG | 28 days supply | Qty: 4 | Fill #0

## 2020-05-18 MED FILL — TRUE METRIX GLUCOSE TEST ST: 25 days supply | Qty: 100 | Fill #0

## 2020-05-18 MED FILL — TRUEplus LANCETS 28G MISC: 25 days supply | Qty: 100 | Fill #0

## 2020-05-18 MED FILL — !FARXIGA 10MG TABLET: 10 | 30 days supply | Qty: 30 | Fill #0

## 2020-05-19 LAB — COMPREHENSIVE METABOLIC PANEL
ALT: 33 IU/L — ABNORMAL HIGH (ref 0–32)
AST: 26 IU/L (ref 0–40)
Albumin/Globulin Ratio: 1.3 (ref 1.2–2.2)
Albumin: 4.5 g/dL (ref 3.8–4.8)
Alkaline Phosphatase: 50 IU/L (ref 44–121)
BUN/Creatinine Ratio: 12 (ref 9–23)
BUN: 10 mg/dL (ref 6–20)
Bilirubin Total: 0.2 mg/dL (ref 0.0–1.2)
CO2: 19 mmol/L — ABNORMAL LOW (ref 20–29)
Calcium: 10.3 mg/dL — ABNORMAL HIGH (ref 8.7–10.2)
Chloride: 99 mmol/L (ref 96–106)
Creatinine, Ser: 0.86 mg/dL (ref 0.57–1.00)
Globulin, Total: 3.6 g/dL (ref 1.5–4.5)
Glucose: 137 mg/dL — ABNORMAL HIGH (ref 65–99)
Potassium: 4.2 mmol/L (ref 3.5–5.2)
Sodium: 139 mmol/L (ref 134–144)
Total Protein: 8.1 g/dL (ref 6.0–8.5)
eGFR: 88 mL/min/{1.73_m2} (ref >59)

## 2020-05-19 LAB — CBC WITH DIFFERENTIAL/PLATELET
Basophils Absolute: 0 10*3/uL (ref 0.0–0.2)
Basos: 0 %
EOS (ABSOLUTE): 0.1 10*3/uL (ref 0.0–0.4)
Eos: 1 %
Hematocrit: 40.9 % (ref 34.0–46.6)
Hemoglobin: 14.3 g/dL (ref 11.1–15.9)
Immature Grans (Abs): 0 10*3/uL (ref 0.0–0.1)
Immature Granulocytes: 0 %
Lymphocytes Absolute: 3.8 10*3/uL — ABNORMAL HIGH (ref 0.7–3.1)
Lymphs: 35 %
MCH: 31.4 pg (ref 26.6–33.0)
MCHC: 35 g/dL (ref 31.5–35.7)
MCV: 90 fL (ref 79–97)
Monocytes Absolute: 0.6 10*3/uL (ref 0.1–0.9)
Monocytes: 6 %
Neutrophils Absolute: 6.2 10*3/uL (ref 1.4–7.0)
Neutrophils: 58 %
Platelets: 412 10*3/uL (ref 150–450)
RBC: 4.56 x10E6/uL (ref 3.77–5.28)
RDW: 13.5 % (ref 11.7–15.4)
WBC: 10.8 10*3/uL (ref 3.4–10.8)

## 2020-05-31 ENCOUNTER — Ambulatory Visit: Payer: Self-pay | Admitting: Pharmacist

## 2020-06-06 ENCOUNTER — Emergency Department (HOSPITAL_BASED_OUTPATIENT_CLINIC_OR_DEPARTMENT_OTHER)
Admission: EM | Admit: 2020-06-06 | Discharge: 2020-06-06 | Disposition: A | Discharge status: Home / Self Care | Payer: Self-pay | Attending: Emergency Medicine | Admitting: Emergency Medicine

## 2020-06-06 ENCOUNTER — Other Ambulatory Visit: Payer: Self-pay

## 2020-06-06 ENCOUNTER — Emergency Department (HOSPITAL_BASED_OUTPATIENT_CLINIC_OR_DEPARTMENT_OTHER): Payer: Self-pay

## 2020-06-06 ENCOUNTER — Other Ambulatory Visit (HOSPITAL_BASED_OUTPATIENT_CLINIC_OR_DEPARTMENT_OTHER): Payer: Self-pay

## 2020-06-06 ENCOUNTER — Encounter (HOSPITAL_BASED_OUTPATIENT_CLINIC_OR_DEPARTMENT_OTHER): Payer: Self-pay

## 2020-06-06 DIAGNOSIS — R109 Unspecified abdominal pain: Secondary | ICD-10-CM

## 2020-06-06 DIAGNOSIS — Z91040 Latex allergy status: Secondary | ICD-10-CM | POA: Insufficient documentation

## 2020-06-06 DIAGNOSIS — Z87891 Personal history of nicotine dependence: Secondary | ICD-10-CM | POA: Insufficient documentation

## 2020-06-06 DIAGNOSIS — N200 Calculus of kidney: Secondary | ICD-10-CM | POA: Insufficient documentation

## 2020-06-06 DIAGNOSIS — L089 Local infection of the skin and subcutaneous tissue, unspecified: Secondary | ICD-10-CM | POA: Insufficient documentation

## 2020-06-06 DIAGNOSIS — E1140 Type 2 diabetes mellitus with diabetic neuropathy, unspecified: Secondary | ICD-10-CM | POA: Insufficient documentation

## 2020-06-06 LAB — URINALYSIS, ROUTINE W REFLEX MICROSCOPIC
Bilirubin Urine: NEGATIVE
Glucose, UA: >=500 mg/dL — AB
Ketones, ur: NEGATIVE mg/dL
Leukocytes,Ua: NEGATIVE
Nitrite: NEGATIVE
Protein, ur: NEGATIVE mg/dL
Specific Gravity, Urine: 1.025 (ref 1.005–1.030)
pH: 5.5 (ref 5.0–8.0)

## 2020-06-06 LAB — URINALYSIS, MICROSCOPIC (REFLEX)

## 2020-06-06 LAB — CBG MONITORING, ED: Glucose-Capillary: 151 mg/dL — ABNORMAL HIGH (ref 70–99)

## 2020-06-06 LAB — PREGNANCY, URINE: Preg Test, Ur: NEGATIVE

## 2020-06-06 MED ORDER — METHOCARBAMOL 1000 MG/10ML IJ SOLN: 1000.0000 mg | Freq: Once | INTRAMUSCULAR | Status: DC

## 2020-06-06 MED ORDER — CYCLOBENZAPRINE HCL 10 MG PO TABS
10.0000 mg | ORAL_TABLET | Freq: Two times a day (BID) | ORAL | 0 refills | Status: DC | PRN
  Filled 2020-06-06: qty 20, 10d supply, fill #0

## 2020-06-06 MED ORDER — KETOROLAC TROMETHAMINE 15 MG/ML IJ SOLN: 30.0000 mg | Freq: Once | INTRAMUSCULAR | Status: DC

## 2020-06-06 MED ORDER — KETOROLAC TROMETHAMINE 60 MG/2ML IM SOLN
30.0000 mg | Freq: Once | INTRAMUSCULAR | Status: AC
  Administered 2020-06-06: 30 mg via INTRAMUSCULAR
  Filled 2020-06-06: qty 2

## 2020-06-06 MED ORDER — CYCLOBENZAPRINE HCL 10 MG PO TABS: 10.0000 mg | ORAL_TABLET | Freq: Two times a day (BID) | ORAL | 0 refills | Status: DC | PRN

## 2020-06-06 MED ORDER — METHOCARBAMOL 500 MG PO TABS
1000.0000 mg | ORAL_TABLET | Freq: Once | ORAL | Status: AC
  Administered 2020-06-06: 1000 mg via ORAL
  Filled 2020-06-06: qty 2

## 2020-06-06 NOTE — ED Triage Notes (Signed)
Pt complains of bilateral flank pain x3days. Denies abdominal pain or urinary symptoms. Pain worsens with movement. Recently increased amount of walking/cardio exercise. Denies known injury. States recently changed diabetic medication.  Also states started on antibiotic yesterday for facial abscess.

## 2020-06-06 NOTE — ED Notes (Signed)
Pt ambulatory to restroom without assistance 

## 2020-06-06 NOTE — ED Notes (Signed)
Patient transported to CT 

## 2020-06-06 NOTE — Discharge Instructions (Addendum)
You have a kidney stone and I suspect a muscular strain.  I have given you a prescription for a muscle relaxer please take as prescribed, please beware this medication make you drowsy do not consume alcohol or operate heavy machinery while taking this medication.  I recommend taking over-the-counter pain medications like ibuprofen and/or Tylenol every 6 as needed.  Please follow dosage and on the back of bottle.  I also recommend applying heat to the area and stretching out the muscles as this will help decrease stiffness and pain.  I have given you information on exercises please follow.  Like you to follow-up with your primary doctor and/or a urologist for further evaluation of your kidney stone.  Come back to the emergency department if you develop chest pain, shortness of breath, severe abdominal pain, uncontrolled nausea, vomiting, diarrhea.

## 2020-06-06 NOTE — ED Provider Notes (Signed)
[facility] Provider Note   CSN: [id number] Arrival date & time: 06/06/20  9702     History Chief Complaint  Patient presents with  . Flank Pain    [facility] is a 40 y.o. female.  HPI   Patient with significant medical history of diabetes, type II, neuropathy, hypercholesterol, kidney stones presents with chief complaint of bilateral flank pain and sore on the left side of her temple.  Patient [facility] is that flank pain started approximately 2 to 3 days ago, it started as a dull nagging sensation which has increased in intensity.  She now has worsening pain in the right flank versus the left flank, pain does not radiate, is not associated with urinary symptoms, no urinary urgency, frequency, dysuria, hematuria, no vaginal discharge or vaginal bleeding present.  She has no abdominal pain, no nausea or vomiting, no systemic infection like fevers or chills.   Patient endorses that pain is exacerbated with movement, especially with bending her back, she denies any worsening paresthesias in her leg, no urinary incontinency or retention.  Patient denies  recent trauma to the area.  Patient also endorsed that she has a skin infection on the left side of her temple, she was started on Keflex yesterday, she has associated weeping but no purulent discharge, no increased redness, no fevers or chills.  Patient denies alleviating factors.  Patient denies headaches, fevers, chills, shortness breath, chest pain, abdominal pain, nausea, vomiting, diarrhea, worsening pedal edema.  Past Medical History:  Diagnosis Date  . Diabetes mellitus without complication ([place])   . Diabetic neuropathy ([place])   . Diarrhea   . High cholesterol   . Irregular periods     Patient Active Problem List   Diagnosis Date Noted  . Skin infection, bacterial 05/17/2020  . Cervical cancer screening 05/17/2020  . Bartholin cyst 05/17/2020  . Hyperlipidemia associated with type 2 diabetes mellitus  ([place]) 09/04/2019  . Vitamin D deficiency 09/03/2019  . COVID-19 virus vaccination declined 09/03/2019  . Marijuana use, continuous 09/03/2019  . Abnormal uterine bleeding 07/29/2017  . Bipolar depression ([place]) 03/19/2017  . Irritable bowel syndrome with both constipation and diarrhea 12/25/2016  . Type 2 diabetes mellitus with morbid obesity ([place]) 12/25/2016  . Class 3 severe obesity due to excess calories with serious comorbidity and body mass index (BMI) of 40.0 to 44.9 in adult ([place]) 12/25/2016  . Irregular menses 12/25/2016    Past Surgical History:  Procedure Laterality Date  . CHOLECYSTECTOMY       OB History    Gravida  0   Para  0   Term  0   Preterm  0   AB  0   Living  0     SAB  0   IAB  0   Ectopic  0   Multiple  0   Live Births  0           Family History  Problem Relation Age of Onset  . Rheum arthritis Mother   . Rheum arthritis Sister   . Arthritis Maternal Uncle   . Arthritis Paternal Uncle   . Diabetes Maternal Grandmother     Social History   Tobacco Use  . Smoking status: Former Research scientist (life sciences)  . Smokeless tobacco: Never Used  Vaping Use  . Vaping Use: Never used  Substance Use Topics  . Alcohol use: Yes    Comment: social  . Drug use: Yes    Types: Marijuana  Home Medications Prior to Admission medications   Medication Sig Start Date End Date Taking? Authorizing Provider  atorvastatin (LIPITOR) 10 MG tablet TAKE 1 TABLET (10 MG TOTAL) BY MOUTH DAILY. 05/17/20 05/17/21 Yes Elsie Stain, MD  cephALEXin (KEFLEX) 500 MG capsule TAKE 1 CAPSULE (500 MG TOTAL) BY MOUTH 4 (FOUR) TIMES DAILY. 05/17/20 05/17/21 Yes Elsie Stain, MD  cyclobenzaprine (FLEXERIL) 10 MG tablet Take 1 tablet (10 mg total) by mouth 2 (two) times daily as needed for muscle spasms. 06/06/20  Yes Marcello Fennel, PA-C  dapagliflozin propanediol (FARXIGA) 10 MG TABS tablet Take 1 tablet (10 mg total) by mouth daily before breakfast. To lower blood sugar  05/17/20  Yes Elsie Stain, MD  Vitamin D, Ergocalciferol, (DRISDOL) 1.25 MG (50000 UNIT) CAPS capsule TAKE 1 CAPSULE (50,000 UNITS TOTAL) BY MOUTH EVERY 7 (SEVEN) DAYS. 05/17/20 05/17/21 Yes Elsie Stain, MD  Blood Glucose Monitoring Suppl (TRUE METRIX METER) w/Device KIT Use to check blood sugar as directed. 05/17/20   Elsie Stain, MD  glucose blood test strip USE AS INSTRUCTED TO CHECK BLOOD SUGARS ONCE PER DAY 05/17/20 05/17/21  Elsie Stain, MD  TRUEplus Lancets 28G MISC USE WHEN CHECKING BLOOD SUGARS ONCE PER DAY 05/17/20 05/17/21  Elsie Stain, MD  famotidine (PEPCID) 20 MG tablet Take 1 tablet (20 mg total) by mouth 2 (two) times daily. Patient not taking: Reported on 05/17/2020 01/04/20 05/17/20  Antony Blackbird, MD    Allergies    Sulfa drugs cross reactors and Latex  Review of Systems   Review of Systems  Constitutional: Negative for chills and fever.  HENT: Negative for congestion, ear discharge and ear pain.   Eyes: Negative for visual disturbance.  Respiratory: Negative for shortness of breath.   Cardiovascular: Negative for chest pain.  Gastrointestinal: Positive for constipation. Negative for abdominal pain, diarrhea, nausea and vomiting.  Genitourinary: Positive for flank pain. Negative for difficulty urinating, dysuria, enuresis, vaginal bleeding and vaginal discharge.  Musculoskeletal: Negative for back pain.  Skin: Negative for rash.  Neurological: Negative for dizziness.  Hematological: Does not bruise/bleed easily.    Physical Exam Updated Vital Signs BP 128/73 (BP Location: Right Arm)   Pulse 62   Temp 98.2 F (36.8 C)   Resp 17   Ht 5' 6" (1.676 m)   Wt 111.1 kg   LMP 05/24/2020   SpO2 94%   BMI 39.54 kg/m   Physical Exam Vitals and nursing note reviewed.  Constitutional:      General: She is not in acute distress.    Appearance: She is not ill-appearing.  HENT:     Head: Normocephalic and atraumatic.     Comments: Patient's head was  evaluated, there is no noted erythema or edema on patient's left temporal region, she was slightly tender to palpation, there is slight induration no fluctuance noted, it was not warm to the touch.    Right Ear: Tympanic membrane, ear canal and external ear normal.     Left Ear: Tympanic membrane, ear canal and external ear normal.     Nose: No congestion.  Eyes:     Conjunctiva/sclera: Conjunctivae normal.  Cardiovascular:     Rate and Rhythm: Normal rate and regular rhythm.     Pulses: Normal pulses.     Heart sounds: No murmur heard. No friction rub. No gallop.   Pulmonary:     Effort: No respiratory distress.     Breath sounds: No wheezing, rhonchi or rales.  Abdominal:     Palpations: Abdomen is soft.     Tenderness: There is no abdominal tenderness. There is no right CVA tenderness or left CVA tenderness.  Musculoskeletal:     Comments: Patient spine was palpated it was nontender to palpation, no step-off or deformities present.  She was notably tender within the musculature surrounding the right iliac crest, she had positive straight leg raise on the right side, strength and intact bilaterally in the lower extremities  Skin:    General: Skin is warm and dry.  Neurological:     Mental Status: She is alert.  Psychiatric:        Mood and Affect: Mood normal.     ED Results / Procedures / Treatments   Labs (all labs ordered are listed, but only abnormal results are displayed) Labs Reviewed  URINALYSIS, ROUTINE W REFLEX MICROSCOPIC - Abnormal; Notable for the following components:      Result Value   APPearance CLOUDY (*)    Glucose, UA >=500 (*)    Hgb urine dipstick MODERATE (*)    All other components within normal limits  URINALYSIS, MICROSCOPIC (REFLEX) - Abnormal; Notable for the following components:   Bacteria, UA FEW (*)    All other components within normal limits  CBG MONITORING, ED - Abnormal; Notable for the following components:   Glucose-Capillary 151 (*)     All other components within normal limits  PREGNANCY, URINE    EKG None  Radiology CT Renal Stone Study  Result Date: 06/06/2020 CLINICAL DATA:  Bilateral flank pain, right greater than left. Microscopic hematuria EXAM: CT ABDOMEN AND PELVIS WITHOUT CONTRAST TECHNIQUE: Multidetector CT imaging of the abdomen and pelvis was performed following the standard protocol without IV contrast. COMPARISON:  None. FINDINGS: Lower chest: Lung bases are clear. No effusions. Heart is normal size. Hepatobiliary: Prior cholecystectomy. Diffuse fatty infiltration of the liver. No focal hepatic abnormality. Pancreas: No focal abnormality or ductal dilatation. Spleen: No focal abnormality.  Normal size. Adrenals/Urinary Tract: Punctate nonobstructing stone in the proximal right ureter, 1 mm. No hydronephrosis. No ureteral stones on the left. Adrenal glands and urinary bladder unremarkable. Stomach/Bowel: Normal appendix. Stomach, large and small bowel grossly unremarkable. Vascular/Lymphatic: No evidence of aneurysm or adenopathy. Reproductive: Uterus and adnexa unremarkable.  No mass. Other: No free fluid or free air. Musculoskeletal: No acute bony abnormality. IMPRESSION: Punctate 1 mm proximal right ureteral stone, nonobstructing. No hydronephrosis. Hepatic steatosis. Electronically Signed   By: Rolm Baptise M.D.   On: 06/06/2020 11:42    Procedures Procedures   Medications Ordered in ED Medications  ketorolac (TORADOL) 15 MG/ML injection 30 mg (has no administration in time range)  methocarbamol (ROBAXIN) tablet 1,000 mg (1,000 mg Oral Given 06/06/20 1044)    ED Course  I have reviewed the triage vital signs and the nursing notes.  Pertinent labs & imaging results that were available during my care of the patient were reviewed by me and considered in my medical decision making (see chart for details).    MDM Rules/Calculators/A&P                         Initial impression-patient presents with  bilateral flank pain and skin infection on her left temporal lobe.  She is alert, does not appear in acute distress, vital signs reassuring.  Suspect this is muscular, will obtain UA urine pregnancy to rule out infectious cause.  Provide Robaxin reassess  Work-up-UA shows negative nitrites or  leukocytes, shows 6-10 red blood cells, few bacteria.  Urine pregnancy negative, CBG 151, CT renal shows 1 mm proximal right urethral stone nonobstructing no hydronephrosis present.   Reassessment patient reassessed at providing her with a muscle relaxer, she states she feels little bit better, has no recommends at this time.  UA negative for signs of infection but she does have red blood cells with a history of kidney stones will obtain CT renal for further evaluation.   Rule out- I have low suspicion for spinal fracture or spinal cord abnormality as patient denies urinary incontinency, retention, difficulty with bowel movements, denies saddle paresthesias.  Spine was palpated there is no step-off, crepitus or gross deformities felt, patient had  5/5 strength, full range of motion, neurovascular fully intact in the lower extremities.  Will defer imaging as there is no traumatic injury associated the pain.  Low suspicion for UTI,  Pyelonephritis as UA is negative for signs of infection.  Low suspicion for abscess along patient's left temporal lobe is there is no fluctuance present, there is some induration present.  Will defer I&D as this will cause more harm than benefit.  Will encourage patient to continue with her antibiotics.  Low suspicion for septic arthritis as patient denies IV drug use, skin exam was performed no erythematous, edema or warm joints noted.   Plan-patient has right-sided flank pain, suspect this may be multifactorial, possible muscular strain as well as overlying kidney stone.  Will provide patient with Flexeril for muscle pain, and recommend over-the-counter pain medication like ibuprofen and  Tylenol for kidney stone.  Recommend that she follows up with urology for further evaluation.  Vital signs have remained stable, no indication for hospital admission.   Patient given at home care as well strict return precautions.  Patient verbalized that they understood agreed to said plan.   Final Clinical Impression(s) / ED Diagnoses Final diagnoses:  Skin infection  Kidney stone  Flank pain    Rx / DC Orders ED Discharge Orders         Ordered    cyclobenzaprine (FLEXERIL) 10 MG tablet  2 times daily PRN        06/06/20 1217           Marcello Fennel, PA-C 06/06/20 1220    Long, Wonda Olds, MD 06/07/20 [phone]

## 2020-06-13 ENCOUNTER — Other Ambulatory Visit (HOSPITAL_BASED_OUTPATIENT_CLINIC_OR_DEPARTMENT_OTHER): Payer: Self-pay

## 2020-06-20 ENCOUNTER — Other Ambulatory Visit: Payer: Self-pay

## 2020-06-20 MED FILL — Ergocalciferol Cap 1.25 MG (50000 Unit): ORAL | 84 days supply | Qty: 12 | Fill #0 | Status: AC

## 2020-06-22 ENCOUNTER — Other Ambulatory Visit (HOSPITAL_COMMUNITY): Payer: Self-pay

## 2020-06-22 ENCOUNTER — Other Ambulatory Visit: Payer: Self-pay

## 2020-06-23 ENCOUNTER — Other Ambulatory Visit: Payer: Self-pay

## 2020-06-27 ENCOUNTER — Other Ambulatory Visit: Payer: Self-pay

## 2020-06-29 ENCOUNTER — Encounter: Payer: Self-pay | Admitting: Obstetrics & Gynecology

## 2020-07-10 ENCOUNTER — Emergency Department (HOSPITAL_BASED_OUTPATIENT_CLINIC_OR_DEPARTMENT_OTHER)
Admission: EM | Admit: 2020-07-10 | Discharge: 2020-07-10 | Disposition: A | Discharge status: Home / Self Care | Payer: Self-pay | Attending: Emergency Medicine | Admitting: Emergency Medicine

## 2020-07-10 ENCOUNTER — Encounter (HOSPITAL_BASED_OUTPATIENT_CLINIC_OR_DEPARTMENT_OTHER): Payer: Self-pay | Admitting: Emergency Medicine

## 2020-07-10 ENCOUNTER — Other Ambulatory Visit: Payer: Self-pay

## 2020-07-10 DIAGNOSIS — R300 Dysuria: Secondary | ICD-10-CM | POA: Insufficient documentation

## 2020-07-10 DIAGNOSIS — E1169 Type 2 diabetes mellitus with other specified complication: Secondary | ICD-10-CM | POA: Insufficient documentation

## 2020-07-10 DIAGNOSIS — R197 Diarrhea, unspecified: Secondary | ICD-10-CM | POA: Insufficient documentation

## 2020-07-10 DIAGNOSIS — R1084 Generalized abdominal pain: Secondary | ICD-10-CM | POA: Insufficient documentation

## 2020-07-10 DIAGNOSIS — E785 Hyperlipidemia, unspecified: Secondary | ICD-10-CM | POA: Insufficient documentation

## 2020-07-10 DIAGNOSIS — B379 Candidiasis, unspecified: Secondary | ICD-10-CM | POA: Insufficient documentation

## 2020-07-10 DIAGNOSIS — Z8616 Personal history of COVID-19: Secondary | ICD-10-CM | POA: Insufficient documentation

## 2020-07-10 DIAGNOSIS — Z91040 Latex allergy status: Secondary | ICD-10-CM | POA: Insufficient documentation

## 2020-07-10 DIAGNOSIS — E1140 Type 2 diabetes mellitus with diabetic neuropathy, unspecified: Secondary | ICD-10-CM | POA: Insufficient documentation

## 2020-07-10 LAB — COMPREHENSIVE METABOLIC PANEL
ALT: 26 U/L (ref 0–44)
AST: 21 U/L (ref 15–41)
Albumin: 3.8 g/dL (ref 3.5–5.0)
Alkaline Phosphatase: 37 U/L — ABNORMAL LOW (ref 38–126)
Anion gap: 10 (ref 5–15)
BUN: 9 mg/dL (ref 6–20)
CO2: 24 mmol/L (ref 22–32)
Calcium: 9 mg/dL (ref 8.9–10.3)
Chloride: 102 mmol/L (ref 98–111)
Creatinine, Ser: 0.72 mg/dL (ref 0.44–1.00)
GFR, Estimated: >60 mL/min (ref >60)
Glucose, Bld: 168 mg/dL — ABNORMAL HIGH (ref 70–99)
Potassium: 4.1 mmol/L (ref 3.5–5.1)
Sodium: 136 mmol/L (ref 135–145)
Total Bilirubin: 0.1 mg/dL — ABNORMAL LOW (ref 0.3–1.2)
Total Protein: 8 g/dL (ref 6.5–8.1)

## 2020-07-10 LAB — CBC WITH DIFFERENTIAL/PLATELET
Abs Immature Granulocytes: 0.04 10*3/uL (ref 0.00–0.07)
Basophils Absolute: 0 10*3/uL (ref 0.0–0.1)
Basophils Relative: 0 %
Eosinophils Absolute: 0 10*3/uL (ref 0.0–0.5)
Eosinophils Relative: 0 %
HCT: 41.1 % (ref 36.0–46.0)
Hemoglobin: 13.8 g/dL (ref 12.0–15.0)
Immature Granulocytes: 0 %
Lymphocytes Relative: 18 %
Lymphs Abs: 2 10*3/uL (ref 0.7–4.0)
MCH: 31.5 pg (ref 26.0–34.0)
MCHC: 33.6 g/dL (ref 30.0–36.0)
MCV: 93.8 fL (ref 80.0–100.0)
Monocytes Absolute: 0.6 10*3/uL (ref 0.1–1.0)
Monocytes Relative: 5 %
Neutro Abs: 8.5 10*3/uL — ABNORMAL HIGH (ref 1.7–7.7)
Neutrophils Relative %: 77 %
Platelets: 386 10*3/uL (ref 150–400)
RBC: 4.38 MIL/uL (ref 3.87–5.11)
RDW: 13.1 % (ref 11.5–15.5)
WBC: 11.2 10*3/uL — ABNORMAL HIGH (ref 4.0–10.5)
nRBC: 0 % (ref 0.0–0.2)

## 2020-07-10 LAB — URINALYSIS, ROUTINE W REFLEX MICROSCOPIC
Bilirubin Urine: NEGATIVE
Glucose, UA: 100 mg/dL — AB
Ketones, ur: 15 mg/dL — AB
Leukocytes,Ua: NEGATIVE
Nitrite: NEGATIVE
Protein, ur: NEGATIVE mg/dL
Specific Gravity, Urine: 1.02 (ref 1.005–1.030)
pH: 6.5 (ref 5.0–8.0)

## 2020-07-10 LAB — URINALYSIS, MICROSCOPIC (REFLEX)

## 2020-07-10 LAB — LIPASE, BLOOD: Lipase: 34 U/L (ref 11–51)

## 2020-07-10 LAB — CBG MONITORING, ED: Glucose-Capillary: 185 mg/dL — ABNORMAL HIGH (ref 70–99)

## 2020-07-10 MED ORDER — FLUCONAZOLE 150 MG PO TABS: 150.0000 mg | ORAL_TABLET | Freq: Every day | ORAL | 1 refills | Status: AC

## 2020-07-10 MED ORDER — ONDANSETRON HCL 4 MG/2ML IJ SOLN
4.0000 mg | Freq: Once | INTRAMUSCULAR | Status: AC
  Administered 2020-07-10: 4 mg via INTRAVENOUS
  Filled 2020-07-10: qty 2

## 2020-07-10 MED ORDER — SODIUM CHLORIDE 0.9 % IV BOLUS
1000.0000 mL | Freq: Once | INTRAVENOUS | Status: AC
  Administered 2020-07-10: 1000 mL via INTRAVENOUS

## 2020-07-10 NOTE — Discharge Instructions (Addendum)
I have provided a prescription to treat your yeast infection, please take 1 tablet today. You may repeat this in 3 days if you experience no improvement in symptoms.   Follow-up with your primary care physician in about 1 week in order to reevaluate your symptoms.

## 2020-07-10 NOTE — ED Triage Notes (Addendum)
Pt brought in by EMS with c/o woke up this morning unable to urinate or have bowel movement. Pt reports right sided abdominal pain and possible UTI. Pt told EMS that she ate 2 day old [employer] around 3 am.

## 2020-07-10 NOTE — ED Notes (Signed)
Reports no n/v after drinking fluid.

## 2020-07-10 NOTE — ED Provider Notes (Signed)
[facility] EMERGENCY DEPARTMENT Provider Note   CSN: [id number] Arrival date & time: 07/10/20  1036     History Chief Complaint  Patient presents with  . Abdominal Pain    [facility] is a 40 y.o. female.  40 y.o female with a PMH of DM, high cholesterol, bipolar depression presents to the ED via ambulance with a chief complaint of abdominal pain that began this morning.  Patient reports eating Janine [employer]bo that she had purchased on Friday and this morning when all the symptoms began.  Reports pain along the right flank described as pressure without radiation.  This was of gradual onset.  Does report thinking that she likely had a UTI, attempted to treat this at home stating "I think I did not get rid of it ".  She also reports pain with urination, feeling like "trickling out with the burning sensation".  Reports 2 episodes of numb bilious emesis prior to arrival.  In addition, she reports seeing "a little blood" in my emesis. She denies any fever, chest pain, shortness of breath.   The history is provided by the patient.  Abdominal Pain Pain location:  R flank Pain quality: pressure   Pain radiates to:  Does not radiate Pain severity:  Moderate Onset quality:  Gradual Chronicity:  New Context: suspicious food intake   Context: not awakening from sleep, not previous surgeries, not recent illness and not sick contacts   Associated symptoms: diarrhea, nausea and vomiting   Associated symptoms: no chest pain, no chills, no cough, no fever, no hematuria, no shortness of breath and no sore throat        Past Medical History:  Diagnosis Date  . Diabetes mellitus without complication ([place])   . Diabetic neuropathy ([place])   . Diarrhea   . High cholesterol   . Irregular periods     Patient Active Problem List   Diagnosis Date Noted  . Skin infection, bacterial 05/17/2020  . Cervical cancer screening 05/17/2020  . Bartholin cyst 05/17/2020  . Hyperlipidemia associated  with type 2 diabetes mellitus ([place]) 09/04/2019  . Vitamin D deficiency 09/03/2019  . COVID-19 virus vaccination declined 09/03/2019  . Marijuana use, continuous 09/03/2019  . Abnormal uterine bleeding 07/29/2017  . Bipolar depression ([place]) 03/19/2017  . Irritable bowel syndrome with both constipation and diarrhea 12/25/2016  . Type 2 diabetes mellitus with morbid obesity ([place]) 12/25/2016  . Class 3 severe obesity due to excess calories with serious comorbidity and body mass index (BMI) of 40.0 to 44.9 in adult ([place]) 12/25/2016  . Irregular menses 12/25/2016    Past Surgical History:  Procedure Laterality Date  . CHOLECYSTECTOMY       OB History    Gravida  0   Para  0   Term  0   Preterm  0   AB  0   Living  0     SAB  0   IAB  0   Ectopic  0   Multiple  0   Live Births  0           Family History  Problem Relation Age of Onset  . Rheum arthritis Mother   . Rheum arthritis Sister   . Arthritis Maternal Uncle   . Arthritis Paternal Uncle   . Diabetes Maternal Grandmother     Social History   Tobacco Use  . Smoking status: Former Research scientist (life sciences)  . Smokeless tobacco: Never Used  Vaping Use  . Vaping Use: Never  used  Substance Use Topics  . Alcohol use: Yes    Comment: social  . Drug use: Yes    Types: Marijuana    Home Medications Prior to Admission medications   Medication Sig Start Date End Date Taking? Authorizing Provider  fluconazole (DIFLUCAN) 150 MG tablet Take 1 tablet (150 mg total) by mouth daily for 1 dose. 07/10/20 07/11/20 Yes Natassja Ollis, PA-C  atorvastatin (LIPITOR) 10 MG tablet TAKE 1 TABLET (10 MG TOTAL) BY MOUTH DAILY. 05/17/20 05/17/21  Elsie Stain, MD  Blood Glucose Monitoring Suppl (TRUE METRIX METER) w/Device KIT Use to check blood sugar as directed. 05/17/20   Elsie Stain, MD  cephALEXin (KEFLEX) 500 MG capsule TAKE 1 CAPSULE (500 MG TOTAL) BY MOUTH 4 (FOUR) TIMES DAILY. 05/17/20 05/17/21  Elsie Stain, MD   cyclobenzaprine (FLEXERIL) 10 MG tablet Take 1 tablet (10 mg total) by mouth 2 (two) times daily as needed for muscle spasms. 06/06/20   Marcello Fennel, PA-C  dapagliflozin propanediol (FARXIGA) 10 MG TABS tablet Take 1 tablet (10 mg total) by mouth daily before breakfast. To lower blood sugar 05/18/20   Elsie Stain, MD  glucose blood test strip USE AS INSTRUCTED TO CHECK BLOOD SUGARS ONCE PER DAY 05/17/20 05/17/21  Elsie Stain, MD  TRUEplus Lancets 28G MISC USE WHEN CHECKING BLOOD SUGARS ONCE PER DAY 05/17/20 05/17/21  Elsie Stain, MD  Vitamin D, Ergocalciferol, (DRISDOL) 1.25 MG (50000 UNIT) CAPS capsule TAKE 1 CAPSULE (50,000 UNITS TOTAL) BY MOUTH EVERY 7 (SEVEN) DAYS. 05/17/20 05/17/21  Elsie Stain, MD  famotidine (PEPCID) 20 MG tablet Take 1 tablet (20 mg total) by mouth 2 (two) times daily. Patient not taking: Reported on 05/17/2020 01/04/20 05/17/20  Antony Blackbird, MD    Allergies    Sulfa drugs cross reactors and Latex  Review of Systems   Review of Systems  Constitutional: Negative for chills and fever.  HENT: Negative for sore throat.   Respiratory: Negative for cough and shortness of breath.   Cardiovascular: Negative for chest pain.  Gastrointestinal: Positive for abdominal pain, diarrhea, nausea and vomiting.  Genitourinary: Negative for hematuria.  Musculoskeletal: Negative for back pain.  Neurological: Negative for light-headedness and headaches.  All other systems reviewed and are negative.   Physical Exam Updated Vital Signs BP (!) 97/51   Pulse (!) 58   Temp 98.3 F (36.8 C) (Oral)   Resp 18   Ht _0  (1.676 m)   Wt 108.9 kg   LMP 06/21/2020   SpO2 98%   BMI 38.74 kg/m   Physical Exam Vitals and nursing note reviewed.  Constitutional:      Appearance: She is well-developed. She is not ill-appearing or toxic-appearing.  HENT:     Head: Normocephalic and atraumatic.  Cardiovascular:     Rate and Rhythm: Normal rate.  Pulmonary:      Effort: Pulmonary effort is normal.     Breath sounds: No wheezing or rales.  Abdominal:     General: Abdomen is flat.     Tenderness: There is abdominal tenderness. There is right CVA tenderness.  Skin:    General: Skin is warm and dry.  Neurological:     Mental Status: She is alert and oriented to person, place, and time.     ED Results / Procedures / Treatments   Labs (all labs ordered are listed, but only abnormal results are displayed) Labs Reviewed  CBC WITH DIFFERENTIAL/PLATELET - Abnormal; Notable for the  following components:      Result Value   WBC 11.2 (*)    Neutro Abs 8.5 (*)    All other components within normal limits  COMPREHENSIVE METABOLIC PANEL - Abnormal; Notable for the following components:   Glucose, Bld 168 (*)    Alkaline Phosphatase 37 (*)    Total Bilirubin 0.1 (*)    All other components within normal limits  URINALYSIS, ROUTINE W REFLEX MICROSCOPIC - Abnormal; Notable for the following components:   APPearance CLOUDY (*)    Glucose, UA 100 (*)    Hgb urine dipstick MODERATE (*)    Ketones, ur 15 (*)    All other components within normal limits  URINALYSIS, MICROSCOPIC (REFLEX) - Abnormal; Notable for the following components:   Bacteria, UA MANY (*)    All other components within normal limits  CBG MONITORING, ED - Abnormal; Notable for the following components:   Glucose-Capillary 185 (*)    All other components within normal limits  LIPASE, BLOOD    EKG None  Radiology No results found.  Procedures Procedures   Medications Ordered in ED Medications  sodium chloride 0.9 % bolus 1,000 mL ( Intravenous Stopped 07/10/20 1436)  ondansetron (ZOFRAN) injection 4 mg (4 mg Intravenous Given 07/10/20 1330)    ED Course  I have reviewed the triage vital signs and the nursing notes.  Pertinent labs & imaging results that were available during my care of the patient were reviewed by me and considered in my medical decision making (see chart  for details).  Clinical Course as of 07/10/20 1604  Sun Jul 10, 2020  1403 Bacteria, UA(!): MANY [JS]  1438 Appearance(!): CLOUDY [JS]  1438 Hgb urine dipstickMarland Kitchen): MODERATE [JS]  2725 Budding Yeast: PRESENT [JS]    Clinical Course User Index [JS] Janeece Fitting, PA-C   MDM Rules/Calculators/A&P                           Patient with a PMH of DM presents to the ED with a chief complaint of right flank pain along with trouble urinating and unable to have a bowel movement after having taco bell this morning around 3 am. She does report feels like she has a UTI "trying to treat this at home", with "uti pills". She states has not taken any of her blood sugar medication, due to not eating anything.  CBG checked on arrival at 185.  During evaluation she appears overall uncomfortable, abdomen is soft, mildly tender to palpation.  There is right CVA tenderness.Lungs are clear to auscultation, no wheezing or rales. Moves all upper and lower extremities.   UA with cloudy appearance, moderate HGB, LMP within 2 weeks ago. Many bacteria and yeast present. Interpretation of her CBC with slight elevation of her WBC, no signs of anemia. CMP without any electrolyte abnormality.  Current levels within normal limits.  Her glucose is 168, she does report not taking any of her blood sugar medication as she has not been able to eat.  LFTs are within normal limits. Lipase level is normal. No CVA, no fever, lower suspicion for pyelonephritis.  Patient was reassessed after providing her with fluids, Zofran, has not had any emesis while in the ED.  She is tolerating p.o. adequately.  We did discussed urine remarkable for yeast, bacteria, recently was started on antibiotic to treat cellulitis of her skin, we discussed treatment of this with Diflucan, 1 dose can repeat this after 3  days.  He appears overall comfortable, has been tolerating p.o. without any further episodes of emesis.  We did discuss continue to hydrate with  plenty of water.  Should rest for the next couple days.  I personally sent the prescription of Diflucan to her pharmacy.  Patient shows and agrees with management, return precautions discussed at length.    Portions of this note were generated with [employer]. Dictation errors may occur despite best attempts at proofreading.  Final Clinical Impression(s) / ED Diagnoses Final diagnoses:  Yeast infection  Generalized abdominal pain    Rx / DC Orders ED Discharge Orders         Ordered    fluconazole (DIFLUCAN) 150 MG tablet  Daily        07/10/20 1601           Janeece Fitting, PA-C 07/10/20 1604    Little, Wenda Over[place], MD 07/13/20 [phone]

## 2020-07-10 NOTE — ED Notes (Signed)
Water and ginger ale provided for po challenge.

## 2020-07-12 ENCOUNTER — Ambulatory Visit: Payer: Self-pay | Admitting: Critical Care Medicine

## 2020-07-12 NOTE — Progress Notes (Deleted)
Subjective:    [name], female    DOB: 10/04/1980, 40 y.o.   MRN: [id number]  40 y.o.F T2DM  HLD  Obese  Former fulp pt  05/17/2020 This patient is seen for the first time since November 2021 when she was last seen by Dr. Jillyn Hidden.  Is a former primary care patient of Dr. Jillyn Hidden now here to establish.  She had difficulty getting into the clinic and getting back in his run out of all her medications occluding all her diabetic medicines.  On arrival A1c is 7.9.  Blood pressure 129/80.  She complains of boils on the back of her neck left ear and lower abdomen.  She is out of her vitamin D and has severe vitamin D deficiency.  She is needing financial assistance as well.  Also she is due up a Pap smear.  Patient states that the vaginal discharge she had previously was unchanged but now has resolved  Below is documentation from the last note from Dr. Jillyn Hidden in 2021 Type 2 diabetes mellitus with morbid obesity (HCC) Patient's last hemoglobin A1c in July was 7.1 but at today's visit, patient's hemoglobin A1c is 8.1 with random glucose of 278.  Patient reports that she has not been taking her Metformin as she feels that this may be contributing to her issues with diarrhea.  She has also not been monitoring her home blood sugars.  Patient is provided with diabetes monitoring supplies.  She is encouraged to keep a blood sugar diary and to follow-up with the clinical pharmacist in the next few weeks regarding her diabetes.  Patient would like a medication other than the Metformin to take for control of her blood sugars.  Prescription provided for Farxiga 5 mg once daily as she has a sulfa allergy and both glipizide and glimepiride could potentially cause a reaction due to her sulfa allergy.  Low carbohydrate/no concentrated sweets diet and regular exercise encouraged.  She will have a urine microalbumin creatinine ratio today's visit along with comprehensive metabolic panel and follow-up of her diabetes.   She will additionally be referred to ophthalmology for diabetic eye exam.  She will also have lipase at today's visit as she also reports issues with epigastric pain. - POCT glycosylated hemoglobin (Hb A1C) - POCT glucose (manual entry) - Microalbumin/Creatinine Ratio, Urine - Comprehensive metabolic panel - Ambulatory referral to Ophthalmology - dapagliflozin propanediol (FARXIGA) 5 MG TABS tablet; Take 1 tablet (5 mg total) by mouth daily before breakfast. To lower blood sugar  Dispense: 30 tablet; Refill: 1 - Blood Glucose Monitoring Suppl (TRUE METRIX METER) w/Device KIT; Use to check blood sugar as directed.  Dispense: 1 kit; Refill: 0 - glucose blood (TRUE METRIX BLOOD GLUCOSE TEST) test strip; Use as instructed to check blood sugars once per day  Dispense: 100 each; Refill: 3 - TRUEplus Lancets 28G MISC; Use when checking blood sugars once per day  Dispense: 100 each; Refill: 1  2. Vaginal discharge Patient with vaginal discharge on pelvic exam and cervicovaginal ancillary testing done.  Patient will be notified of the results and if further treatment is needed based on the results - Cervicovaginal ancillary only  3. Bartholin gland cyst On pelvic exam, patient has what appears to be a Bartholin gland cyst along the right wall of the vaginal canal.  She is being referred to gynecology for further evaluation and treatment - Ambulatory referral to Gynecology  4. Epigastric pain Patient with complaint of epigastric pain for which she  will have lipase level done.  Prescription provided for famotidine 20 mg twice daily to help reduce stomach acid and patient has been referred to gastroenterology for further evaluation and treatment.  Patient had been on omeprazole but discontinued the use of this medication.  Omeprazole can cause diarrhea in some patients. - Ambulatory referral to Gastroenterology - famotidine (PEPCID) 20 MG tablet; Take 1 tablet (20 mg total) by mouth 2 (two) times  daily.  Dispense: 60 tablet; Refill: 3 - Lipase  5. Diarrhea, unspecified type Patient with complaint of recurrent episodes of diarrhea which usually occurs a few hours after eating.  Patient has stopped the use of both Metformin and omeprazole without any changes in her diarrhea.  She may continue to take over-the-counter Imodium as needed.  She is also advised to avoid spicy/greasy foods.  She is to be referred to gastroenterology for further evaluation and treatment. - Ambulatory referral to Gastroenterology   The patient actually never got in with gastroenterology but her diarrhea now has resolved itself.  Patient is requesting financial assistance and assistance with her medications.  07/12/2020  Type 2 diabetes mellitus with morbid obesity ([place]) Type 2 diabetes not well controlled off all medications  Begin Farxiga 10 mg daily  Glucose testing supplies given to the patient  Obtain CBC and complete metabolic panel Obtain eye exam  Skin infection, bacterial Bacterial skin infection administer Keflex for 7-day course  Bartholin cyst History of Bartholin cyst  Referral to gynecology  Irregular menses Referral to gynecology  Vitamin D deficiency Refill vitamin D supplement  Cervical cancer screening Refer to gynecology for cancer screening Pap smear  Hyperlipidemia associated with type 2 diabetes mellitus ([place]) Refill atorvastatin   Colleen Downs was seen today for re-estasblish and diabetes.  Diagnoses and all orders for this visit:  Type 2 diabetes mellitus with morbid obesity ([place]) -     POCT glucose (manual entry) -     POCT glycosylated hemoglobin (Hb A1C) -     Blood Glucose Monitoring Suppl (TRUE METRIX METER) w/Device KIT; Use to check blood sugar as directed. -     dapagliflozin propanediol (FARXIGA) 10 MG TABS tablet; Take 1 tablet (10 mg total) by mouth daily before breakfast. To lower blood sugar -     glucose blood (TRUE METRIX BLOOD GLUCOSE TEST) test  strip; Use as instructed to check blood sugars once per day -     TRUEplus Lancets 28G MISC; Use when checking blood sugars once per day  Type 2 diabetes mellitus with hyperglycemia, unspecified whether long term insulin use (HCC) -     CBC with Differential/Platelet -     Comprehensive metabolic panel  Skin infection, bacterial -     CBC with Differential/Platelet  Cervical cancer screening -     Ambulatory referral to Gynecology  Bartholin cyst -     Ambulatory referral to Gynecology  Irregular menses  Vitamin D deficiency  Hyperlipidemia associated with type 2 diabetes mellitus ([place])  Other orders -     atorvastatin (LIPITOR) 10 MG tablet; Take 1 tablet (10 mg total) by mouth daily. -     Vitamin D, Ergocalciferol, (DRISDOL) 1.25 MG (50000 UNIT) CAPS capsule; Take 1 capsule (50,000 Units total) by mouth every 7 (seven) days. -     cephALEXin (KEFLEX) 500 MG capsule; Take 1 capsule (500 mg total) by mouth 4 (four) times daily.         Past Medical History:  Diagnosis Date  . Diabetes mellitus without  complication ([place])   . Diabetic neuropathy ([place])   . Diarrhea   . High cholesterol   . Irregular periods      Family History  Problem Relation Age of Onset  . Rheum arthritis Mother   . Rheum arthritis Sister   . Arthritis Maternal Uncle   . Arthritis Paternal Uncle   . Diabetes Maternal Grandmother      Social History   Socioeconomic History  . Marital status: Single    Spouse name: Not on file  . Number of children: Not on file  . Years of education: Not on file  . Highest education level: Not on file  Occupational History  . Not on file  Tobacco Use  . Smoking status: Former Research scientist (life sciences)  . Smokeless tobacco: Never Used  Vaping Use  . Vaping Use: Never used  Substance and Sexual Activity  . Alcohol use: Yes    Comment: social  . Drug use: Yes    Types: Marijuana  . Sexual activity: Not Currently    Birth control/protection: Condom  Other Topics  Concern  . Not on file  Social History Narrative  . Not on file   Social Determinants of Health   Financial Resource Strain: Not on file  Food Insecurity: Not on file  Transportation Needs: Not on file  Physical Activity: Not on file  Stress: Not on file  Social Connections: Not on file  Intimate Partner Violence: Not on file     Allergies  Allergen Reactions  . Sulfa Drugs Cross Reactors Anaphylaxis  . Latex      Outpatient Medications Prior to Visit  Medication Sig Dispense Refill  . atorvastatin (LIPITOR) 10 MG tablet TAKE 1 TABLET (10 MG TOTAL) BY MOUTH DAILY. 90 tablet 3  . Blood Glucose Monitoring Suppl (TRUE METRIX METER) w/Device KIT Use to check blood sugar as directed. 1 kit 0  . cephALEXin (KEFLEX) 500 MG capsule TAKE 1 CAPSULE (500 MG TOTAL) BY MOUTH 4 (FOUR) TIMES DAILY. 28 capsule 0  . cyclobenzaprine (FLEXERIL) 10 MG tablet Take 1 tablet (10 mg total) by mouth 2 (two) times daily as needed for muscle spasms. 20 tablet 0  . dapagliflozin propanediol (FARXIGA) 10 MG TABS tablet Take 1 tablet (10 mg total) by mouth daily before breakfast. To lower blood sugar 30 tablet 6  . glucose blood test strip USE AS INSTRUCTED TO CHECK BLOOD SUGARS ONCE PER DAY 100 strip 3  . TRUEplus Lancets 28G MISC USE WHEN CHECKING BLOOD SUGARS ONCE PER DAY 100 each 1  . Vitamin D, Ergocalciferol, (DRISDOL) 1.25 MG (50000 UNIT) CAPS capsule TAKE 1 CAPSULE (50,000 UNITS TOTAL) BY MOUTH EVERY 7 (SEVEN) DAYS. 16 capsule 0   No facility-administered medications prior to visit.     Review of Systems  Constitutional: Positive for fatigue.  HENT: Negative.   Respiratory: Negative.   Cardiovascular: Negative.   Gastrointestinal: Negative.   Endocrine: Positive for polydipsia, polyphagia and polyuria.  Musculoskeletal: Negative.   Skin: Positive for wound.  Neurological: Negative.   Hematological: Negative.   Psychiatric/Behavioral: Negative.        Objective:   Physical Exam There  were no vitals filed for this visit.  Gen: Pleasant, well-nourished, in no distress,  normal affect  ENT: No lesions,  mouth clear,  oropharynx clear, no postnasal drip  Neck: No JVD, no TMG, no carotid bruits  Lungs: No use of accessory muscles, no dullness to percussion, clear without rales or rhonchi  Cardiovascular: RRR, heart sounds  normal, no murmur or gallops, no peripheral edema  Abdomen: soft and NT, no HSM,  BS normal  Musculoskeletal: No deformities, no cyanosis or clubbing  Neuro: alert, non focal  Skin: Warm, subcutaneous boils over the right upper shoulder left anterior ear and suprapubic area  Hemoglobin A1c 7.9      Assessment & Plan:  I personally reviewed all images and lab data in the [facility] system as well as any outside material available during this office visit and agree with the  radiology impressions.   No problem-specific Assessment & Plan notes found for this encounter.   There are no diagnoses linked to this encounter.

## 2020-07-27 ENCOUNTER — Encounter: Payer: Self-pay | Admitting: Obstetrics and Gynecology

## 2020-08-04 ENCOUNTER — Other Ambulatory Visit: Payer: Self-pay

## 2020-08-05 ENCOUNTER — Other Ambulatory Visit: Payer: Self-pay

## 2020-08-18 ENCOUNTER — Encounter: Payer: Self-pay | Admitting: Obstetrics & Gynecology

## 2020-08-24 ENCOUNTER — Encounter: Payer: Self-pay | Admitting: Certified Nurse Midwife

## 2020-09-25 NOTE — Progress Notes (Signed)
Subjective:    [name], female    DOB: March 10, 1980, 40 y.o.   MRN: [id number]  39 y.o.F T2DM  HLD  Obese  Former fulp pt  05/17/2020 This patient is seen for the first time since November 2021 when she was last seen by Dr. Chapman Fitch.  Is a former primary care patient of Dr. Chapman Fitch now here to establish.  She had difficulty getting into the clinic and getting back in his run out of all her medications occluding all her diabetic medicines.  On arrival A1c is 7.9.  Blood pressure 129/80.  She complains of boils on the back of her neck left ear and lower abdomen.  She is out of her vitamin D and has severe vitamin D deficiency.  She is needing financial assistance as well.  Also she is due up a Pap smear.  Patient states that the vaginal discharge she had previously was unchanged but now has resolved  Below is documentation from the last note from Dr. Chapman Fitch in 2021 Type 2 diabetes mellitus with morbid obesity ([place]) Patient's last hemoglobin A1c in July was 7.1 but at today's visit, patient's hemoglobin A1c is 8.1 with random glucose of 278.  Patient reports that she has not been taking her Metformin as she feels that this may be contributing to her issues with diarrhea.  She has also not been monitoring her home blood sugars.  Patient is provided with diabetes monitoring supplies.  She is encouraged to keep a blood sugar diary and to follow-up with the clinical pharmacist in the next few weeks regarding her diabetes.  Patient would like a medication other than the Metformin to take for control of her blood sugars.  Prescription provided for Farxiga 5 mg once daily as she has a sulfa allergy and both glipizide and glimepiride could potentially cause a reaction due to her sulfa allergy.  Low carbohydrate/no concentrated sweets diet and regular exercise encouraged.  She will have a urine microalbumin creatinine ratio today's visit along with comprehensive metabolic panel and follow-up of her diabetes.   She will additionally be referred to ophthalmology for diabetic eye exam.  She will also have lipase at today's visit as she also reports issues with epigastric pain. - POCT glycosylated hemoglobin (Hb A1C) - POCT glucose (manual entry) - Microalbumin/Creatinine Ratio, Urine - Comprehensive metabolic panel - Ambulatory referral to Ophthalmology - dapagliflozin propanediol (FARXIGA) 5 MG TABS tablet; Take 1 tablet (5 mg total) by mouth daily before breakfast. To lower blood sugar  Dispense: 30 tablet; Refill: 1 - Blood Glucose Monitoring Suppl (TRUE METRIX METER) w/Device KIT; Use to check blood sugar as directed.  Dispense: 1 kit; Refill: 0 - glucose blood (TRUE METRIX BLOOD GLUCOSE TEST) test strip; Use as instructed to check blood sugars once per day  Dispense: 100 each; Refill: 3 - TRUEplus Lancets 28G MISC; Use when checking blood sugars once per day  Dispense: 100 each; Refill: 1   2. Vaginal discharge Patient with vaginal discharge on pelvic exam and cervicovaginal ancillary testing done.  Patient will be notified of the results and if further treatment is needed based on the results - Cervicovaginal ancillary only   3. Bartholin gland cyst On pelvic exam, patient has what appears to be a Bartholin gland cyst along the right wall of the vaginal canal.  She is being referred to gynecology for further evaluation and treatment - Ambulatory referral to Gynecology   4. Epigastric pain Patient with complaint of epigastric pain  for which she will have lipase level done.  Prescription provided for famotidine 20 mg twice daily to help reduce stomach acid and patient has been referred to gastroenterology for further evaluation and treatment.  Patient had been on omeprazole but discontinued the use of this medication.  Omeprazole can cause diarrhea in some patients. - Ambulatory referral to Gastroenterology - famotidine (PEPCID) 20 MG tablet; Take 1 tablet (20 mg total) by mouth 2 (two) times  daily.  Dispense: 60 tablet; Refill: 3 - Lipase   5. Diarrhea, unspecified type Patient with complaint of recurrent episodes of diarrhea which usually occurs a few hours after eating.  Patient has stopped the use of both Metformin and omeprazole without any changes in her diarrhea.  She may continue to take over-the-counter Imodium as needed.  She is also advised to avoid spicy/greasy foods.  She is to be referred to gastroenterology for further evaluation and treatment. - Ambulatory referral to Gastroenterology     The patient actually never got in with gastroenterology but her diarrhea now has resolved itself.  Patient is requesting financial assistance and assistance with her medications.  47/26 This 40 year old female seen in return visit today in clinic.  Patient complains of occasional headaches and left-sided facial pain at the site of a dermoid cyst its been recurrently infected in front of the left ear.  She is about to get full health insurance is and is interested in having this removed.  Patient has a type 2 diabetes with obesity she had been on Farxiga 10 mg daily with reasonably good control today's A1c is 7.3 blood sugar today is 163 she had been off medications for about 3 days because of access.  She is getting full medical accessed today but her medication access and her especially referrals are not kicking in until end of the month.  This would include vision and dental.  Patient does score high on her PHQ-9 PHQ 2 and GAD-7 She does not have a specific plan to end her life but has thought about being better off dead than alive.  She works as an Mining engineer.  Patient complains of some right-sided foot swelling at times.  Patient did have a yeast infection when she received Keflex for the left ear.  She is now resolved the yeast infection.  She has an upcoming appointment with gynecology for a Pap smear.  She is yet to get her ophthalmology visit but will achieve this when she  has the insurance.  She had a emergency room visit in April for back pain this is improved itself.  Patient continues to struggle with family conflicts    Past Medical History:  Diagnosis Date   Diabetes mellitus without complication ([place])    Diabetic neuropathy ([place])    Diarrhea    High cholesterol    Irregular periods      Family History  Problem Relation Age of Onset   Rheum arthritis Mother    Rheum arthritis Sister    Arthritis Maternal Uncle    Arthritis Paternal Uncle    Diabetes Maternal Grandmother      Social History   Socioeconomic History   Marital status: Single    Spouse name: Not on file   Number of children: Not on file   Years of education: Not on file   Highest education level: Not on file  Occupational History   Not on file  Tobacco Use   Smoking status: Former   Smokeless tobacco: Never  Vaping  Use   Vaping Use: Never used  Substance and Sexual Activity   Alcohol use: Yes    Comment: social   Drug use: Yes    Types: Marijuana   Sexual activity: Not Currently    Birth control/protection: Condom  Other Topics Concern   Not on file  Social History Narrative   Not on file   Social Determinants of Health   Financial Resource Strain: Not on file  Food Insecurity: Not on file  Transportation Needs: Not on file  Physical Activity: Not on file  Stress: Not on file  Social Connections: Not on file  Intimate Partner Violence: Not on file     Allergies  Allergen Reactions   Sulfa Drugs Cross Reactors Anaphylaxis   Latex      Outpatient Medications Prior to Visit  Medication Sig Dispense Refill   Blood Glucose Monitoring Suppl (TRUE METRIX METER) w/Device KIT Use to check blood sugar as directed. 1 kit 0   glucose blood test strip USE AS INSTRUCTED TO CHECK BLOOD SUGARS ONCE PER DAY 100 strip 3   TRUEplus Lancets 28G MISC USE WHEN CHECKING BLOOD SUGARS ONCE PER DAY 100 each 1   atorvastatin (LIPITOR) 10 MG tablet TAKE 1 TABLET (10 MG  TOTAL) BY MOUTH DAILY. (Patient not taking: Reported on 09/26/2020) 90 tablet 3   cephALEXin (KEFLEX) 500 MG capsule TAKE 1 CAPSULE (500 MG TOTAL) BY MOUTH 4 (FOUR) TIMES DAILY. (Patient not taking: Reported on 09/26/2020) 28 capsule 0   cyclobenzaprine (FLEXERIL) 10 MG tablet Take 1 tablet (10 mg total) by mouth 2 (two) times daily as needed for muscle spasms. (Patient not taking: Reported on 09/26/2020) 20 tablet 0   dapagliflozin propanediol (FARXIGA) 10 MG TABS tablet Take 1 tablet (10 mg total) by mouth daily before breakfast. To lower blood sugar (Patient not taking: Reported on 09/26/2020) 30 tablet 6   Vitamin D, Ergocalciferol, (DRISDOL) 1.25 MG (50000 UNIT) CAPS capsule TAKE 1 CAPSULE (50,000 UNITS TOTAL) BY MOUTH EVERY 7 (SEVEN) DAYS. (Patient not taking: Reported on 09/26/2020) 16 capsule 0   No facility-administered medications prior to visit.     Review of Systems  Constitutional:  Positive for fatigue.  HENT: Negative.    Respiratory: Negative.    Cardiovascular: Negative.   Gastrointestinal: Negative.   Endocrine: Positive for polydipsia, polyphagia and polyuria.  Musculoskeletal: Negative.   Skin:  Positive for wound.  Neurological: Negative.   Hematological: Negative.   Psychiatric/Behavioral: Negative.        Objective:   Physical Exam Vitals:   09/26/20 1538  BP: 136/81  Pulse: 87  SpO2: 97%  Weight: 242 lb 3.2 oz (109.9 kg)    Gen: obese, in no distress,  depressed affect  ENT: Dermoid cyst anterior to the left ear mouth clear,  oropharynx clear, no postnasal drip  Neck: No JVD, no TMG, no carotid bruits  Lungs: No use of accessory muscles, no dullness to percussion, clear without rales or rhonchi  Cardiovascular: RRR, heart sounds normal, no murmur or gallops, no peripheral edema  Abdomen: soft and NT, no HSM,  BS normal  Musculoskeletal: No deformities, no cyanosis or clubbing  Neuro: alert, non focal  Skin: Warm, subcutaneous boils over the right upper  shoulder left anterior ear and suprapubic area  Hemoglobin A1c 7.3 CBG 163  Foot exam is done and is normal      Assessment & Plan:  I personally reviewed all images and lab data in the [facility] system as well  as any outside material available during this office visit and agree with the  radiology impressions.   Type 2 diabetes mellitus with morbid obesity ([place]) Type 2 diabetes improved control on Farxiga needs patient access to the [place] until she gets full insurance the end of the month  Refills on the Farxiga given  I went over the patient appropriate diabetic diet  Hyperlipidemia associated with type 2 diabetes mellitus ([place]) Renewal on atorvastatin given to the patient  Class 3 severe obesity due to excess calories with serious comorbidity and body mass index (BMI) of 40.0 to 44.9 in adult ([place]) Again weight loss management education given  Vitamin D deficiency Continue vitamin D weekly  Cervical cancer screening Patient knows to keep cervical cancer screening appointment  Dermoid cyst of ear, left Plan referral to plastic surgery for removal  Major depression Moderate depression without concurrent plans of suicide  Begin sertraline 50 mg daily and refer to licensed clinical social worker for further counseling   Colleen Downs was seen today for follow-up.  Diagnoses and all orders for this visit:  Type 2 diabetes mellitus with morbid obesity ([place]) -     Glucose (CBG) -     HgB A1c -     dapagliflozin propanediol (FARXIGA) 10 MG TABS tablet; Take 1 tablet (10 mg total) by mouth daily before breakfast. To lower blood sugar  Cervical cancer screening -     Cancel: Ambulatory referral to Gynecology  Dermoid cyst of ear, left -     Ambulatory referral to Plastic Surgery  Need for hepatitis C screening test -     HCV Ab w Reflex to Quant PCR  Hyperlipidemia associated with type 2 diabetes mellitus ([place])  Class 3 severe obesity due to excess calories with serious  comorbidity and body mass index (BMI) of 40.0 to 44.9 in adult [facility])  Vitamin D deficiency  Moderate episode of recurrent major depressive disorder ([place])  Other orders -     atorvastatin (LIPITOR) 10 MG tablet; TAKE 1 TABLET (10 MG TOTAL) BY MOUTH DAILY. -     Vitamin D, Ergocalciferol, (DRISDOL) 1.25 MG (50000 UNIT) CAPS capsule; TAKE 1 CAPSULE (50,000 UNITS TOTAL) BY MOUTH EVERY 7 (SEVEN) DAYS. -     sertraline (ZOLOFT) 50 MG tablet; Take 1 tablet (50 mg total) by mouth daily.    Hepatitis C screening will be achieved  Referral to licensed clinical social worker will be made

## 2020-09-26 ENCOUNTER — Other Ambulatory Visit: Payer: Self-pay

## 2020-09-26 ENCOUNTER — Telehealth: Payer: Self-pay | Admitting: Critical Care Medicine

## 2020-09-26 ENCOUNTER — Ambulatory Visit: Payer: Self-pay | Attending: Critical Care Medicine | Admitting: Critical Care Medicine

## 2020-09-26 ENCOUNTER — Encounter: Payer: Self-pay | Admitting: Critical Care Medicine

## 2020-09-26 DIAGNOSIS — Z1159 Encounter for screening for other viral diseases: Secondary | ICD-10-CM

## 2020-09-26 DIAGNOSIS — E559 Vitamin D deficiency, unspecified: Secondary | ICD-10-CM

## 2020-09-26 DIAGNOSIS — Z6841 Body Mass Index (BMI) 40.0 and over, adult: Secondary | ICD-10-CM

## 2020-09-26 DIAGNOSIS — F329 Major depressive disorder, single episode, unspecified: Secondary | ICD-10-CM | POA: Insufficient documentation

## 2020-09-26 DIAGNOSIS — D2322 Other benign neoplasm of skin of left ear and external auricular canal: Secondary | ICD-10-CM | POA: Insufficient documentation

## 2020-09-26 DIAGNOSIS — Z124 Encounter for screening for malignant neoplasm of cervix: Secondary | ICD-10-CM

## 2020-09-26 DIAGNOSIS — E785 Hyperlipidemia, unspecified: Secondary | ICD-10-CM

## 2020-09-26 DIAGNOSIS — F331 Major depressive disorder, recurrent, moderate: Secondary | ICD-10-CM

## 2020-09-26 DIAGNOSIS — E1169 Type 2 diabetes mellitus with other specified complication: Secondary | ICD-10-CM

## 2020-09-26 LAB — GLUCOSE, POCT (MANUAL RESULT ENTRY): POC Glucose: 163 mg/dl — AB (ref 70–99)

## 2020-09-26 LAB — POCT GLYCOSYLATED HEMOGLOBIN (HGB A1C): Hemoglobin A1C: 7.3 % — AB (ref 4.0–5.6)

## 2020-09-26 MED ORDER — SERTRALINE HCL 50 MG PO TABS
50.0000 mg | ORAL_TABLET | Freq: Every day | ORAL | 3 refills | Status: DC
  Filled 2020-09-26 – 2020-10-14 (×2): qty 30, 30d supply, fill #0

## 2020-09-26 MED ORDER — ATORVASTATIN CALCIUM 10 MG PO TABS
ORAL_TABLET | Freq: Every day | ORAL | 3 refills | Status: DC
  Filled 2020-09-26: qty 30, 30d supply, fill #0
  Filled 2020-09-29: qty 30, 30d supply, fill #1

## 2020-09-26 MED ORDER — DAPAGLIFLOZIN PROPANEDIOL 10 MG PO TABS
10.0000 mg | ORAL_TABLET | Freq: Every day | ORAL | 6 refills | Status: DC
  Filled 2020-09-26: qty 30, 30d supply, fill #0
  Filled 2020-09-29: qty 30, 30d supply, fill #1
  Filled 2020-12-05 – 2020-12-30 (×2): qty 30, 30d supply, fill #2

## 2020-09-26 MED ORDER — VITAMIN D (ERGOCALCIFEROL) 1.25 MG (50000 UNIT) PO CAPS
ORAL_CAPSULE | ORAL | 0 refills | Status: DC
  Filled 2020-09-26: qty 4, 28d supply, fill #0
  Filled 2020-09-29: qty 4, 28d supply, fill #1
  Filled 2020-12-05 – 2020-12-30 (×2): qty 4, 28d supply, fill #2
  Filled 2021-02-07: qty 4, 28d supply, fill #3

## 2020-09-26 NOTE — Assessment & Plan Note (Signed)
Again weight loss management education given

## 2020-09-26 NOTE — Patient Instructions (Addendum)
Resume Farxiga 1 tablet daily  Resume atorvastatin 1 daily  Resume vitamin D weekly  Begin sertraline 1 daily for depression  Referral to our licensed clinical social worker [place] will be made she will call you  Resource for eye exam and dental exam was given to you once you get your dental and vision insurance you may call for appointments  Referral to gynecology is made for a Pap smear  Referral to plastic surgery made for the dermoid cyst that is in front of your left ear  Lab today was a hepatitis C virus screening  Follow healthy diet as outlined on the attachment  Return to see Dr. Joya Gaskins 2 months

## 2020-09-26 NOTE — Telephone Encounter (Signed)
Mod to severe depression   no defn plan but tendency to think is better off dead.  Started sertraline.  Can you call her soon?  She is getting private health insurance so ?options for private psychiatry?

## 2020-09-26 NOTE — Assessment & Plan Note (Signed)
Renewal on atorvastatin given to the patient

## 2020-09-26 NOTE — Assessment & Plan Note (Signed)
Plan referral to plastic surgery for removal

## 2020-09-26 NOTE — Assessment & Plan Note (Signed)
Continue vitamin D weekly.  

## 2020-09-26 NOTE — Assessment & Plan Note (Signed)
Patient knows to keep cervical cancer screening appointment

## 2020-09-26 NOTE — Assessment & Plan Note (Signed)
Type 2 diabetes improved control on Farxiga needs patient access to the [place] until she gets full insurance the end of the month  Refills on the Farxiga given  I went over the patient appropriate diabetic diet

## 2020-09-26 NOTE — Assessment & Plan Note (Signed)
Moderate depression without concurrent plans of suicide  Begin sertraline 50 mg daily and refer to licensed clinical social worker for further counseling

## 2020-09-27 LAB — HCV INTERPRETATION

## 2020-09-27 LAB — HCV AB W REFLEX TO QUANT PCR: HCV Ab: <0.1 s/co ratio (ref 0.0–0.9)

## 2020-09-28 ENCOUNTER — Other Ambulatory Visit: Payer: Self-pay

## 2020-09-28 ENCOUNTER — Other Ambulatory Visit (HOSPITAL_COMMUNITY)
Admission: RE | Admit: 2020-09-28 | Discharge: 2020-09-28 | Disposition: A | Discharge status: Home / Self Care | Payer: Self-pay | Source: Ambulatory Visit | Attending: Certified Nurse Midwife | Admitting: Certified Nurse Midwife

## 2020-09-28 ENCOUNTER — Encounter: Payer: Self-pay | Admitting: Certified Nurse Midwife

## 2020-09-28 ENCOUNTER — Ambulatory Visit (INDEPENDENT_AMBULATORY_CARE_PROVIDER_SITE_OTHER): Payer: Self-pay | Admitting: Certified Nurse Midwife

## 2020-09-28 VITALS — BP 126/73 | HR 70 | Wt 241.0 lb

## 2020-09-28 DIAGNOSIS — Z01419 Encounter for gynecological examination (general) (routine) without abnormal findings: Secondary | ICD-10-CM

## 2020-09-28 DIAGNOSIS — Z3202 Encounter for pregnancy test, result negative: Secondary | ICD-10-CM

## 2020-09-28 DIAGNOSIS — F419 Anxiety disorder, unspecified: Secondary | ICD-10-CM

## 2020-09-28 DIAGNOSIS — N926 Irregular menstruation, unspecified: Secondary | ICD-10-CM

## 2020-09-28 DIAGNOSIS — F3289 Other specified depressive episodes: Secondary | ICD-10-CM

## 2020-09-28 LAB — POCT PREGNANCY, URINE: Preg Test, Ur: NEGATIVE

## 2020-09-28 NOTE — Progress Notes (Signed)
GYNECOLOGY CLINIC ANNUAL PREVENTATIVE CARE ENCOUNTER NOTE  Subjective:   Colleen Downs is a 40 y.o. G0P0000 female here for a routine annual gynecologic exam.  Current complains of depression, anxiety and a missed mentrual period. Pt states that she has battled depression for  the last 4 years and has noticed that it has gotten worse in the last "few months." She denies suicidal or homicidal ideations. She endorses a high stress environment at home and financial insecurities. She denies emotional or physical abuse and states that she feels safe at home. Has had regular menstrual cycles for the last year, with a 5 day bleed, however she did not have a period last month. Pt is seen at [facility] for primary care.   Denies discharge, pelvic pain, problems with intercourse or other gynecologic concerns.    Gynecologic History Patient's last menstrual period was 08/22/2020. Contraception: condoms without complaints.  Last Pap: 02/11/2017 Results were: normal  Pt has never had a mammogram.   Obstetric History OB History  Gravida Para Term Preterm AB Living  0 0 0 0 0 0  SAB IAB Ectopic Multiple Live Births  0 0 0 0 0    Past Medical History:  Diagnosis Date   Diabetes mellitus without complication (HCC)    Diabetic neuropathy ([place])    Diarrhea    High cholesterol    Irregular periods     Past Surgical History:  Procedure Laterality Date   CHOLECYSTECTOMY      Current Outpatient Medications on File Prior to Visit  Medication Sig Dispense Refill   atorvastatin (LIPITOR) 10 MG tablet TAKE 1 TABLET (10 MG TOTAL) BY MOUTH DAILY. 90 tablet 3   Blood Glucose Monitoring Suppl (TRUE METRIX METER) w/Device KIT Use to check blood sugar as directed. 1 kit 0   dapagliflozin propanediol (FARXIGA) 10 MG TABS tablet Take 1 tablet (10 mg total) by mouth daily before breakfast. To lower blood sugar 30 tablet 6   glucose blood test strip USE AS INSTRUCTED TO CHECK BLOOD SUGARS ONCE  PER DAY 100 strip 3   sertraline (ZOLOFT) 50 MG tablet Take 1 tablet (50 mg total) by mouth daily. 30 tablet 3   TRUEplus Lancets 28G MISC USE WHEN CHECKING BLOOD SUGARS ONCE PER DAY 100 each 1   Vitamin D, Ergocalciferol, (DRISDOL) 1.25 MG (50000 UNIT) CAPS capsule TAKE 1 CAPSULE (50,000 UNITS TOTAL) BY MOUTH EVERY 7 (SEVEN) DAYS. 16 capsule 0   [DISCONTINUED] famotidine (PEPCID) 20 MG tablet Take 1 tablet (20 mg total) by mouth 2 (two) times daily. (Patient not taking: Reported on 05/17/2020) 60 tablet 3   No current facility-administered medications on file prior to visit.    Allergies  Allergen Reactions   Sulfa Drugs Cross Reactors Anaphylaxis   Latex     Social History   Socioeconomic History   Marital status: Single    Spouse name: Not on file   Number of children: Not on file   Years of education: Not on file   Highest education level: Not on file  Occupational History   Not on file  Tobacco Use   Smoking status: Former   Smokeless tobacco: Never  Vaping Use   Vaping Use: Never used  Substance and Sexual Activity   Alcohol use: Yes    Comment: social   Drug use: Yes    Types: Marijuana   Sexual activity: Not Currently    Birth control/protection: Condom  Other Topics Concern  Not on file  Social History Narrative   Not on file   Social Determinants of Health   Financial Resource Strain: Not on file  Food Insecurity: Not on file  Transportation Needs: Not on file  Physical Activity: Not on file  Stress: Not on file  Social Connections: Not on file  Intimate Partner Violence: Not on file    Family History  Problem Relation Age of Onset   Rheum arthritis Mother    Rheum arthritis Sister    Arthritis Maternal Uncle    Arthritis Paternal Uncle    Diabetes Maternal Grandmother     The following portions of the patient's history were reviewed and updated as appropriate: allergies, current medications, past family history, past medical history, past  social history, past surgical history and problem list.  Review of Systems Pertinent items noted in HPI and remainder of comprehensive ROS otherwise negative.   Objective:  BP 126/73   Pulse 70   Wt 109.3 kg   LMP 08/22/2020   BMI 38.90 kg/m  CONSTITUTIONAL: Well-developed, well-nourished female in no acute distress.  HENT:  Normocephalic, atraumatic, External right and left ear normal. Oropharynx is clear and moist EYES: Conjunctivae and EOM are normal. Pupils are equal, round, and reactive to light. No scleral icterus.  NECK: Normal range of motion, supple, no masses.  Normal thyroid.  SKIN: Skin is warm and dry. No rash noted. Not diaphoretic. No erythema. No pallor. Hirsutism present.  [place]: Alert and oriented to person, place, and time. Normal reflexes, muscle tone coordination. No cranial nerve deficit noted. PSYCHIATRIC: Depressed mood and flat affect. Normal judgment and thought content. CARDIOVASCULAR: Normal heart rate noted, regular rhythm RESPIRATORY: Clear to auscultation bilaterally. Effort and breath sounds normal, no problems with respiration noted. ABDOMEN: Soft, normal bowel sounds, no distention noted.  No tenderness, rebound or guarding.  PELVIC: Normal appearing external genitalia; normal appearing vaginal mucosa and cervix.  No abnormal discharge noted.  Pap smear obtained.  MUSCULOSKELETAL: Normal range of motion. No tenderness.  No cyanosis, clubbing, or edema.  2+ distal pulses.   Assessment &Plan :  Annual gynecologic examination with pap smear - Continue with routine GYN care.  -Pap collected results pending. Will follow up results of pap smear and manage accordingly. - Dicussed the necessity of mammograms starting at age 89.  - Routine preventative health maintenance measures emphasized. 2.   Anxiety and Depression  - Referral to Ambulatory behavorial health with [facility].   Irregular menses  - Referral to Juluis Mire, NP with [facility] for PCP.   Follow up in 1 year for annual exam.   Deloris Ping, Student-MidWife  [place] [facility]) Rollene Rotunda, BSN, RNC-OB  Student Nurse-Midwife   09/28/2020  6:06 PM

## 2020-09-28 NOTE — Telephone Encounter (Signed)
Spoke with pt and pt declined services. She stated that she was unhappy with services with [facility] and is leaving the clinic. Stated that she is already being referred to psychiatry and therapy

## 2020-09-29 ENCOUNTER — Other Ambulatory Visit: Payer: Self-pay

## 2020-09-29 NOTE — Telephone Encounter (Signed)
Not surprised based on my last encounter with the patient

## 2020-10-02 LAB — CYTOLOGY - PAP
Adequacy: ABSENT
Comment: NEGATIVE
Diagnosis: NEGATIVE
High risk HPV: NEGATIVE

## 2020-10-13 ENCOUNTER — Other Ambulatory Visit: Payer: Self-pay

## 2020-10-14 ENCOUNTER — Other Ambulatory Visit: Payer: Self-pay

## 2020-10-17 ENCOUNTER — Other Ambulatory Visit: Payer: Self-pay

## 2020-11-16 ENCOUNTER — Other Ambulatory Visit: Payer: Self-pay

## 2020-11-16 ENCOUNTER — Encounter (HOSPITAL_BASED_OUTPATIENT_CLINIC_OR_DEPARTMENT_OTHER): Payer: Self-pay | Admitting: *Deleted

## 2020-11-16 ENCOUNTER — Emergency Department (HOSPITAL_BASED_OUTPATIENT_CLINIC_OR_DEPARTMENT_OTHER)
Admission: EM | Admit: 2020-11-16 | Discharge: 2020-11-17 | Disposition: A | Discharge status: Home / Self Care | Payer: 59 | Attending: Emergency Medicine | Admitting: Emergency Medicine

## 2020-11-16 DIAGNOSIS — E1140 Type 2 diabetes mellitus with diabetic neuropathy, unspecified: Secondary | ICD-10-CM | POA: Insufficient documentation

## 2020-11-16 DIAGNOSIS — Z28310 Unvaccinated for covid-19: Secondary | ICD-10-CM | POA: Insufficient documentation

## 2020-11-16 DIAGNOSIS — Z91040 Latex allergy status: Secondary | ICD-10-CM | POA: Diagnosis not present

## 2020-11-16 DIAGNOSIS — U071 COVID-19: Secondary | ICD-10-CM | POA: Diagnosis not present

## 2020-11-16 DIAGNOSIS — J069 Acute upper respiratory infection, unspecified: Secondary | ICD-10-CM | POA: Diagnosis not present

## 2020-11-16 DIAGNOSIS — R059 Cough, unspecified: Secondary | ICD-10-CM | POA: Diagnosis present

## 2020-11-16 DIAGNOSIS — Z87891 Personal history of nicotine dependence: Secondary | ICD-10-CM | POA: Diagnosis not present

## 2020-11-16 NOTE — ED Provider Notes (Signed)
Emergency Department Provider Note   I have reviewed the triage vital signs and the nursing notes.   HISTORY  Chief Complaint Cough   HPI Colleen Downs is a 40 y.o. female with past medical history of diabetes, elevated BMI, high cholesterol presents to the emergency department with cough, congestion, body aches, headache.  Patient has had symptoms for the past 3 days.  She is not vaccinated or boosted against COVID.  No known prior infection.  She is not having particular chest pain.  She is feeling some mild dyspnea but mainly experiencing severe cough.  Symptoms are moderate and constant with no clear modifying factors or radiation of symptoms.   Past Medical History:  Diagnosis Date   Diabetes mellitus without complication ([place])    Diabetic neuropathy ([place])    Diarrhea    High cholesterol    Irregular periods     Patient Active Problem List   Diagnosis Date Noted   Dermoid cyst of ear, left 09/26/2020   Major depression 09/26/2020   Cervical cancer screening 05/17/2020   Bartholin cyst 05/17/2020   Hyperlipidemia associated with type 2 diabetes mellitus ([place]) 09/04/2019   Vitamin D deficiency 09/03/2019   COVID-19 virus vaccination declined 09/03/2019   Marijuana use, continuous 09/03/2019   Abnormal uterine bleeding 07/29/2017   Bipolar depression ([place]) 03/19/2017   Irritable bowel syndrome with both constipation and diarrhea 12/25/2016   Type 2 diabetes mellitus with morbid obesity ([place]) 12/25/2016   Class 3 severe obesity due to excess calories with serious comorbidity and body mass index (BMI) of 40.0 to 44.9 in adult ([place]) 12/25/2016   Irregular menses 12/25/2016    Past Surgical History:  Procedure Laterality Date   CHOLECYSTECTOMY      Allergies Sulfa drugs cross reactors and Latex  Family History  Problem Relation Age of Onset   Rheum arthritis Mother    Rheum arthritis Sister    Arthritis Maternal Uncle    Arthritis Paternal Uncle    Diabetes  Maternal Grandmother     Social History Social History   Tobacco Use   Smoking status: Former   Smokeless tobacco: Never  Scientific laboratory technician Use: Never used  Substance Use Topics   Alcohol use: Yes    Comment: social   Drug use: Yes    Types: Marijuana    Review of Systems  Constitutional: Positive fever/chills and diffuse body aches.  Eyes: No visual changes. ENT: Positive sore throat and congestion.  Cardiovascular: Denies chest pain. Respiratory: Mild shortness of breath w/ cough.  Gastrointestinal: No abdominal pain.  No nausea, no vomiting.  No diarrhea.  No constipation. Genitourinary: Negative for dysuria. Musculoskeletal: Negative for back pain. Skin: Negative for rash. Neurological: Negative for focal weakness or numbness. Positive HA.   10-point ROS otherwise negative.  ____________________________________________   PHYSICAL EXAM:  VITAL SIGNS: ED Triage Vitals  Enc Vitals Group     BP 11/16/20 2132 128/77     Pulse Rate 11/16/20 2132 89     Resp 11/16/20 2132 18     Temp 11/16/20 2132 99.6 F (37.6 C)     Temp Source 11/16/20 2132 Oral     SpO2 11/16/20 2132 100 %     Weight 11/16/20 2131 240 lb (108.9 kg)     Height 11/16/20 2131 5\' 6"  (1.676 m)    Constitutional: Alert and oriented. Well appearing and in no acute distress. Eyes: Conjunctivae are normal.  Head: Atraumatic. Nose: No congestion/rhinnorhea. Mouth/Throat: Mucous  membranes are moist.  Oropharynx non-erythematous. Neck: No stridor.   Cardiovascular: Normal rate, regular rhythm. Good peripheral circulation. Grossly normal heart sounds.   Respiratory: Normal respiratory effort.  No retractions. Lungs CTAB. No wheezing.  Gastrointestinal: Soft and nontender. No distention.  Musculoskeletal: No lower extremity tenderness nor edema. No gross deformities of extremities. Neurologic:  Normal speech and language. No gross focal neurologic deficits are appreciated.  Skin:  Skin is warm, dry  and intact. No rash noted.  ____________________________________________   LABS (all labs ordered are listed, but only abnormal results are displayed)  Labs Reviewed  RESP PANEL BY RT-PCR (FLU A&B, COVID) ARPGX2 - Abnormal; Notable for the following components:      Result Value   SARS Coronavirus 2 by RT PCR POSITIVE (*)    All other components within normal limits  BASIC METABOLIC PANEL - Abnormal; Notable for the following components:   Sodium 134 (*)    Potassium 3.4 (*)    Glucose, Bld 109 (*)    Calcium 8.6 (*)    All other components within normal limits   ____________________________________________  RADIOLOGY  None  ____________________________________________   PROCEDURES  Procedure(s) performed:   Procedures  None  ____________________________________________   INITIAL IMPRESSION / ASSESSMENT AND PLAN / ED COURSE  Pertinent labs & imaging results that were available during my care of the patient were reviewed by me and considered in my medical decision making (see chart for details).   Patient presents emergency department with upper respiratory infection symptoms.  Lungs are clear and oxygen saturations are 100%.  My suspicion for COVID is elevated.  Patient is unvaccinated and has several risk factors for worsening disease should her COVID test be positive.  She would be interested in Paxil that if her COVID test is positive and so we will obtain a basic metabolic panel to assess her kidney function along with COVID PCR.  Doubt bacterial pneumonia, sepsis, PE, or ACS although these were considered.   Labs reviewed. Normal renal function. COVID PCR is positive. Plan for Paxlovid, supportive care, and quarantine. Discussed results and treatment plan along with ED return precautions with the patient.   Colleen Downs was evaluated in Emergency Department on 11/17/2020 for the symptoms described in the history of present illness. She was evaluated in the context of  the global COVID-19 pandemic, which necessitated consideration that the patient might be at risk for infection with the SARS-CoV-2 virus that causes COVID-19. Institutional protocols and algorithms that pertain to the evaluation of patients at risk for COVID-19 are in a state of rapid change based on information released by regulatory bodies including the CDC and federal and state organizations. These policies and algorithms were followed during the patient's care in the ED.  ____________________________________________  FINAL CLINICAL IMPRESSION(S) / ED DIAGNOSES  Final diagnoses:  COVID-19  Viral URI with cough     NEW OUTPATIENT MEDICATIONS STARTED DURING THIS VISIT:  New Prescriptions   ALBUTEROL (VENTOLIN HFA) 108 (90 BASE) MCG/ACT INHALER    Inhale 1-2 puffs into the lungs every 6 (six) hours as needed for wheezing or shortness of breath.   BENZONATATE (TESSALON) 100 MG CAPSULE    Take 1 capsule (100 mg total) by mouth 3 (three) times daily as needed for cough.   NIRMATRELVIR/RITONAVIR EUA (PAXLOVID) 20 X 150 MG & 10 X 100MG  TABS    Take 3 tablets by mouth 2 (two) times daily for 5 days. Patient GFR is 60. Take nirmatrelvir (150 mg) two  tablets twice daily for 5 days and ritonavir (100 mg) one tablet twice daily for 5 days.    Note:  This document was prepared using Dragon voice recognition software and may include unintentional dictation errors.  Nanda Quinton, MD, [facility]    Murry Diaz, [facility], MD 11/17/20 [phone]

## 2020-11-16 NOTE — ED Triage Notes (Signed)
C/o cough, fever , body aches x 3 days

## 2020-11-17 LAB — BASIC METABOLIC PANEL
Anion gap: 8 (ref 5–15)
BUN: 6 mg/dL (ref 6–20)
CO2: 24 mmol/L (ref 22–32)
Calcium: 8.6 mg/dL — ABNORMAL LOW (ref 8.9–10.3)
Chloride: 102 mmol/L (ref 98–111)
Creatinine, Ser: 0.86 mg/dL (ref 0.44–1.00)
GFR, Estimated: >60 mL/min (ref >60)
Glucose, Bld: 109 mg/dL — ABNORMAL HIGH (ref 70–99)
Potassium: 3.4 mmol/L — ABNORMAL LOW (ref 3.5–5.1)
Sodium: 134 mmol/L — ABNORMAL LOW (ref 135–145)

## 2020-11-17 LAB — RESP PANEL BY RT-PCR (FLU A&B, COVID) ARPGX2
Influenza A by PCR: NEGATIVE
Influenza B by PCR: NEGATIVE
SARS Coronavirus 2 by RT PCR: POSITIVE — AB

## 2020-11-17 MED ORDER — BENZONATATE 100 MG PO CAPS
100.0000 mg | ORAL_CAPSULE | Freq: Three times a day (TID) | ORAL | 0 refills | Status: DC | PRN
Start: 2020-11-17 — End: 2021-06-13

## 2020-11-17 MED ORDER — ALBUTEROL SULFATE HFA 108 (90 BASE) MCG/ACT IN AERS: 1.0000 | INHALATION_SPRAY | Freq: Four times a day (QID) | RESPIRATORY_TRACT | 0 refills | Status: DC | PRN

## 2020-11-17 MED ORDER — NIRMATRELVIR/RITONAVIR (PAXLOVID)TABLET: 3.0000 | ORAL_TABLET | Freq: Two times a day (BID) | ORAL | 0 refills | Status: AC

## 2020-11-17 NOTE — Discharge Instructions (Signed)
You were seen in the emergency department today with cough and body aches.  Your COVID test is positive.  We are starting you on several medicines to help with symptoms along with antiviral medications to take for the next 5 days.  If he develop worsening shortness of breath, chest discomfort, confusion you should return to the emergency department for reevaluation.

## 2020-12-05 ENCOUNTER — Other Ambulatory Visit: Payer: Self-pay

## 2020-12-12 ENCOUNTER — Other Ambulatory Visit: Payer: Self-pay

## 2020-12-25 NOTE — Progress Notes (Deleted)
Established Patient Office Visit  Subjective:  [name], female    DOB: 28-Sep-1980  Age: 40 y.o. MRN: [id number]  CC: No chief complaint on file.   HPI Colleen Downs presents for ***  Past Medical History:  Diagnosis Date   Diabetes mellitus without complication ([place])    Diabetic neuropathy ([place])    Diarrhea    High cholesterol    Irregular periods     Past Surgical History:  Procedure Laterality Date   CHOLECYSTECTOMY      Family History  Problem Relation Age of Onset   Rheum arthritis Mother    Rheum arthritis Sister    Arthritis Maternal Uncle    Arthritis Paternal Uncle    Diabetes Maternal Grandmother     Social History   Socioeconomic History   Marital status: Single    Spouse name: Not on file   Number of children: Not on file   Years of education: Not on file   Highest education level: Not on file  Occupational History   Not on file  Tobacco Use   Smoking status: Former   Smokeless tobacco: Never  Vaping Use   Vaping Use: Never used  Substance and Sexual Activity   Alcohol use: Yes    Comment: social   Drug use: Yes    Types: Marijuana   Sexual activity: Not Currently    Birth control/protection: Condom  Other Topics Concern   Not on file  Social History Narrative   Not on file   Social Determinants of Health   Financial Resource Strain: Not on file  Food Insecurity: Not on file  Transportation Needs: Not on file  Physical Activity: Not on file  Stress: Not on file  Social Connections: Not on file  Intimate Partner Violence: Not on file    Outpatient Medications Prior to Visit  Medication Sig Dispense Refill   albuterol (VENTOLIN HFA) 108 (90 Base) MCG/ACT inhaler Inhale 1-2 puffs into the lungs every 6 (six) hours as needed for wheezing or shortness of breath. 6.7 g 0   atorvastatin (LIPITOR) 10 MG tablet TAKE 1 TABLET (10 MG TOTAL) BY MOUTH DAILY. 90 tablet 3   benzonatate (TESSALON) 100 MG capsule Take 1 capsule  (100 mg total) by mouth 3 (three) times daily as needed for cough. 21 capsule 0   Blood Glucose Monitoring Suppl (TRUE METRIX METER) w/Device KIT Use to check blood sugar as directed. 1 kit 0   dapagliflozin propanediol (FARXIGA) 10 MG TABS tablet Take 1 tablet (10 mg total) by mouth daily before breakfast. To lower blood sugar 30 tablet 6   glucose blood test strip USE AS INSTRUCTED TO CHECK BLOOD SUGARS ONCE PER DAY 100 strip 3   sertraline (ZOLOFT) 50 MG tablet Take 1 tablet (50 mg total) by mouth daily. 30 tablet 3   TRUEplus Lancets 28G MISC USE WHEN CHECKING BLOOD SUGARS ONCE PER DAY 100 each 1   Vitamin D, Ergocalciferol, (DRISDOL) 1.25 MG (50000 UNIT) CAPS capsule TAKE 1 CAPSULE (50,000 UNITS TOTAL) BY MOUTH EVERY 7 (SEVEN) DAYS. 16 capsule 0   No facility-administered medications prior to visit.    Allergies  Allergen Reactions   Sulfa Drugs Cross Reactors Anaphylaxis   Latex     ROS Review of Systems    Objective:    Physical Exam  There were no vitals taken for this visit. Wt Readings from Last 3 Encounters:  11/16/20 240 lb (108.9 kg)  09/28/20 241 lb (109.3 kg)  09/26/20 242 lb 3.2 oz (109.9 kg)     Health Maintenance Due  Topic Date Due   Pneumococcal Vaccine 64-28 Years old (1 - PCV) Never done   OPHTHALMOLOGY EXAM  Never done   URINE MICROALBUMIN  01/03/2021    There are no preventive care reminders to display for this patient.  Lab Results  Component Value Date   TSH 1.260 05/08/2019   Lab Results  Component Value Date   WBC 11.2 (H) 07/10/2020   HGB 13.8 07/10/2020   HCT 41.1 07/10/2020   MCV 93.8 07/10/2020   PLT 386 07/10/2020   Lab Results  Component Value Date   NA 134 (L) 11/17/2020   K 3.4 (L) 11/17/2020   CO2 24 11/17/2020   GLUCOSE 109 (H) 11/17/2020   BUN 6 11/17/2020   CREATININE 0.86 11/17/2020   BILITOT 0.1 (L) 07/10/2020   ALKPHOS 37 (L) 07/10/2020   AST 21 07/10/2020   ALT 26 07/10/2020   PROT 8.0 07/10/2020   ALBUMIN  3.8 07/10/2020   CALCIUM 8.6 (L) 11/17/2020   ANIONGAP 8 11/17/2020   EGFR 88 05/17/2020   Lab Results  Component Value Date   CHOL 226 (H) 09/03/2019   Lab Results  Component Value Date   HDL 46 09/03/2019   Lab Results  Component Value Date   LDLCALC 153 (H) 09/03/2019   Lab Results  Component Value Date   TRIG 149 09/03/2019   Lab Results  Component Value Date   CHOLHDL 4.9 (H) 09/03/2019   Lab Results  Component Value Date   HGBA1C 7.3 (A) 09/26/2020      Assessment & Plan:   Problem List Items Addressed This Visit   None   No orders of the defined types were placed in this encounter.   Follow-up: No follow-ups on file.    Asencion Noble, MD

## 2020-12-26 ENCOUNTER — Telehealth: Payer: Self-pay | Admitting: Critical Care Medicine

## 2020-12-26 ENCOUNTER — Ambulatory Visit: Payer: Self-pay | Admitting: Critical Care Medicine

## 2020-12-26 NOTE — Telephone Encounter (Signed)
Pt called to cancel appt today. She states that she woke up with chest pain and that she "would head to the ED at some point today." I offered to transfer pt to Triage after cancelling and she refused. Please advise.

## 2020-12-26 NOTE — Telephone Encounter (Signed)
She was on our schedule today and no showed

## 2020-12-30 ENCOUNTER — Other Ambulatory Visit: Payer: Self-pay

## 2021-02-07 ENCOUNTER — Other Ambulatory Visit: Payer: Self-pay

## 2021-05-26 ENCOUNTER — Ambulatory Visit: Payer: Self-pay

## 2021-05-26 NOTE — Telephone Encounter (Signed)
Pt is calling because she has having irregular bleeding - the condom failed after having sex. Pt had spoting after this. Pt is wanted advice where she can get STD testing. Pt set up a NPA at [facility].   ?  ? ?Chief Complaint: Pt. Having irregular menstrual periods. Has a history of this per pt. ?Symptoms: Mild cramping. ?Frequency: Started her period 8 days ago. Spotting. ?Pertinent Negatives: Patient denies  ?Disposition: '[]'$ ED /'[]'$ Urgent Care (no appt availability in office) / '[]'$ Appointment(In office/virtual)/ '[]'$  [facility] [place]/ '[]'$ Home Care/ '[]'$ Refused Recommended Disposition /'[]'$ [place] [facility]/ '[x]'$  Follow-up with PCP ?Additional Notes: Has appointment 06/14/21. States she has no signs of STD's. Instructed she can reach out to [facility] for screening.  ?Reason for Disposition ? [1] Bleeding or spotting between regular periods AND [2] occurs more than three cycles (3 months) this past year ? ?Answer Assessment - Initial Assessment Questions ?1. AMOUNT: "Describe the bleeding that you are having."  ?  - SPOTTING: spotting, or pinkish / brownish mucous discharge; does not fill panty liner or pad  ?  - MILD:  less than 1 pad / hour; less than patient's usual menstrual bleeding ?  - MODERATE: 1-2 pads / hour; 1 menstrual cup every 6 hours; small-medium blood clots (e.g., pea, grape, small coin) ?  - SEVERE: soaking 2 or more pads/hour for 2 or more hours; 1 menstrual cup every 2 hours; bleeding not contained by pads or continuous red blood from vagina; large blood clots (e.g., golf ball, large coin)  ?    Spotting ?2. ONSET: "When did the bleeding begin?" "Is it continuing now?" ?    8 days ago ?3. MENSTRUAL PERIOD: "When was the last normal menstrual period?" "How is this different than your period?" ?    Normally regular ?4. REGULARITY: "How regular are your periods?" ?    Regular ?5. ABDOMINAL PAIN: "Do you have any pain?" "How bad is the pain?"  (e.g., Scale 1-10; mild, moderate,  or severe) ?  - MILD (1-3): doesn't interfere with normal activities, abdomen soft and not tender to touch  ?  - MODERATE (4-7): interferes with normal activities or awakens from sleep, abdomen tender to touch  ?  - SEVERE (8-10): excruciating pain, doubled over, unable to do any normal activities  ?    Mild ?6. PREGNANCY: "Could you be pregnant?" "Are you sexually active?" "Did you recently give birth?" ?    No ?7. BREASTFEEDING: "Are you breastfeeding?" ?    No ?8. HORMONES: "Are you taking any hormone medications, prescription or OTC?" (e.g., birth control pills, estrogen) ?    No ?9. BLOOD THINNERS: "Do you take any blood thinners?" (e.g., Coumadin/warfarin, Pradaxa/dabigatran, aspirin) ?    No ?10. CAUSE: "What do you think is causing the bleeding?" (e.g., recent gyn surgery, recent gyn procedure; known bleeding disorder, cervical cancer, polycystic ovarian disease, fibroids)   ?      Unsure ?11. HEMODYNAMIC STATUS: "Are you weak or feeling lightheaded?" If Yes, ask: "Can you stand and walk normally?"  ?      No ?12. OTHER SYMPTOMS: "What other symptoms are you having with the bleeding?" (e.g., passed tissue, vaginal discharge, fever, menstrual-type cramps) ?      No ? ?Protocols used: Vaginal Bleeding - Abnormal-A-AH ? ?

## 2021-05-27 ENCOUNTER — Encounter (HOSPITAL_BASED_OUTPATIENT_CLINIC_OR_DEPARTMENT_OTHER): Payer: Self-pay | Admitting: Emergency Medicine

## 2021-05-27 ENCOUNTER — Other Ambulatory Visit: Payer: Self-pay

## 2021-05-27 ENCOUNTER — Emergency Department (HOSPITAL_BASED_OUTPATIENT_CLINIC_OR_DEPARTMENT_OTHER)
Admission: EM | Admit: 2021-05-27 | Discharge: 2021-05-27 | Disposition: A | Discharge status: Home / Self Care | Payer: 59 | Attending: Emergency Medicine | Admitting: Emergency Medicine

## 2021-05-27 DIAGNOSIS — Z91040 Latex allergy status: Secondary | ICD-10-CM | POA: Insufficient documentation

## 2021-05-27 DIAGNOSIS — N939 Abnormal uterine and vaginal bleeding, unspecified: Secondary | ICD-10-CM | POA: Diagnosis present

## 2021-05-27 DIAGNOSIS — N938 Other specified abnormal uterine and vaginal bleeding: Secondary | ICD-10-CM | POA: Insufficient documentation

## 2021-05-27 LAB — URINALYSIS, MICROSCOPIC (REFLEX)

## 2021-05-27 LAB — URINALYSIS, ROUTINE W REFLEX MICROSCOPIC
Bilirubin Urine: NEGATIVE
Glucose, UA: NEGATIVE mg/dL
Ketones, ur: NEGATIVE mg/dL
Leukocytes,Ua: NEGATIVE
Nitrite: NEGATIVE
Protein, ur: NEGATIVE mg/dL
Specific Gravity, Urine: >=1.03 (ref 1.005–1.030)
pH: 6 (ref 5.0–8.0)

## 2021-05-27 LAB — HIV ANTIBODY (ROUTINE TESTING W REFLEX): HIV Screen 4th Generation wRfx: NONREACTIVE

## 2021-05-27 LAB — PREGNANCY, URINE: Preg Test, Ur: NEGATIVE

## 2021-05-27 LAB — WET PREP, GENITAL
Sperm: NONE SEEN
Trich, Wet Prep: NONE SEEN
WBC, Wet Prep HPF POC: <10 (ref <10)
Yeast Wet Prep HPF POC: NONE SEEN

## 2021-05-27 MED ORDER — METRONIDAZOLE 500 MG PO TABS: 500.0000 mg | ORAL_TABLET | Freq: Two times a day (BID) | ORAL | 0 refills | Status: DC

## 2021-05-27 MED ORDER — KETOROLAC TROMETHAMINE 15 MG/ML IJ SOLN
15.0000 mg | Freq: Once | INTRAMUSCULAR | Status: AC
  Administered 2021-05-27: 15 mg via INTRAMUSCULAR
  Filled 2021-05-27: qty 1

## 2021-05-27 NOTE — ED Provider Notes (Signed)
?[facility] DEPARTMENT ?Provider Note ? ? ?CSN: [id number] ?Arrival date & time: 05/27/21  1336 ? ?  ? ?History ? ?Chief Complaint  ?Patient presents with  ? Vaginal Bleeding  ? ? ?Colleen Downs is a 41 y.o. female. ? ?41 yo F with chief complaints of vaginal bleeding.  This has been going on for about a week or so.  She had sexual intercourse with her partner and reportedly had lost the condom in her vagina was able to get it out.  Since then she has had intermittent bleeding.  Had her menstrual cycle and then had spotting afterwards.  Has never had a problem like this before.  She does not have current primary care and has a scheduled appointment in a couple weeks time but started having some worsening abdominal cramping and so came to the emergency department for evaluation. ? ? ?Vaginal Bleeding ? ?  ? ?Home Medications ?Prior to Admission medications   ?Medication Sig Start Date End Date Taking? Authorizing Provider  ?metroNIDAZOLE (FLAGYL) 500 MG tablet Take 1 tablet (500 mg total) by mouth 2 (two) times daily. 05/27/21  Yes Deno Etienne, DO  ?albuterol (VENTOLIN HFA) 108 (90 Base) MCG/ACT inhaler Inhale 1-2 puffs into the lungs every 6 (six) hours as needed for wheezing or shortness of breath. 11/17/20   Long, Wonda Olds, MD  ?atorvastatin (LIPITOR) 10 MG tablet TAKE 1 TABLET (10 MG TOTAL) BY MOUTH DAILY. 09/26/20 09/26/21  Elsie Stain, MD  ?benzonatate (TESSALON) 100 MG capsule Take 1 capsule (100 mg total) by mouth 3 (three) times daily as needed for cough. 11/17/20   Long, Wonda Olds, MD  ?Blood Glucose Monitoring Suppl (TRUE METRIX METER) w/Device KIT Use to check blood sugar as directed. 05/17/20   Elsie Stain, MD  ?dapagliflozin propanediol (FARXIGA) 10 MG TABS tablet Take 1 tablet (10 mg total) by mouth daily before breakfast. To lower blood sugar 09/26/20   Elsie Stain, MD  ?sertraline (ZOLOFT) 50 MG tablet Take 1 tablet (50 mg total) by mouth daily. 09/26/20   Elsie Stain, MD   ?Vitamin D, Ergocalciferol, (DRISDOL) 1.25 MG (50000 UNIT) CAPS capsule TAKE 1 CAPSULE (50,000 UNITS TOTAL) BY MOUTH EVERY 7 (SEVEN) DAYS. 09/26/20 09/26/21  Elsie Stain, MD  ?famotidine (PEPCID) 20 MG tablet Take 1 tablet (20 mg total) by mouth 2 (two) times daily. ?Patient not taking: Reported on 05/17/2020 01/04/20 05/17/20  Antony Blackbird, MD  ?   ? ?Allergies    ?Sulfa drugs cross reactors and Latex   ? ?Review of Systems   ?Review of Systems  ?Genitourinary:  Positive for vaginal bleeding.  ? ?Physical Exam ?Updated Vital Signs ?BP 134/72 (BP Location: Left Arm)   Pulse 70   Temp 98.6 ?F (37 ?C) (Oral)   Resp 18   SpO2 100%  ?Physical Exam ?Vitals and nursing note reviewed.  ?Constitutional:   ?   General: She is not in acute distress. ?   Appearance: She is well-developed. She is not diaphoretic.  ?HENT:  ?   Head: Normocephalic and atraumatic.  ?Eyes:  ?   Pupils: Pupils are equal, round, and reactive to light.  ?Cardiovascular:  ?   Rate and Rhythm: Normal rate and regular rhythm.  ?   Heart sounds: No murmur heard. ?  No friction rub. No gallop.  ?Pulmonary:  ?   Effort: Pulmonary effort is normal.  ?   Breath sounds: No wheezing or rales.  ?Abdominal:  ?  General: There is no distension.  ?   Palpations: Abdomen is soft.  ?   Tenderness: There is no abdominal tenderness.  ?   Comments: Benign abdominal exam  ?Genitourinary: ?   Comments: No blood in the vault.  No cervical motion tenderness.  No adnexal tenderness or masses. ?Musculoskeletal:     ?   General: No tenderness.  ?   Cervical back: Normal range of motion and neck supple.  ?Skin: ?   General: Skin is warm and dry.  ?Neurological:  ?   Mental Status: She is alert and oriented to person, place, and time.  ?Psychiatric:     ?   Behavior: Behavior normal.  ? ? ?ED Results / Procedures / Treatments   ?Labs ?(all labs ordered are listed, but only abnormal results are displayed) ?Labs Reviewed  ?WET PREP, GENITAL - Abnormal; Notable for the  following components:  ?    Result Value  ? Clue Cells Wet Prep HPF POC PRESENT (*)   ? All other components within normal limits  ?URINALYSIS, ROUTINE W REFLEX MICROSCOPIC - Abnormal; Notable for the following components:  ? Hgb urine dipstick SMALL (*)   ? All other components within normal limits  ?URINALYSIS, MICROSCOPIC (REFLEX) - Abnormal; Notable for the following components:  ? Bacteria, UA MANY (*)   ? All other components within normal limits  ?PREGNANCY, URINE  ?RPR  ?HIV ANTIBODY (ROUTINE TESTING W REFLEX)  ?GC/CHLAMYDIA PROBE AMP ([place]) NOT AT [facility]  ? ? ?EKG ?None ? ?Radiology ?No results found. ? ?Procedures ?Procedures  ? ? ?Medications Ordered in ED ?Medications  ?ketorolac (TORADOL) 15 MG/ML injection 15 mg (15 mg Intramuscular Given 05/27/21 1545)  ? ? ?ED Course/ Medical Decision Making/ A&P ?  ?                        ?Medical Decision Making ?Amount and/or Complexity of Data Reviewed ?Labs: ordered. ? ?Risk ?Prescription drug management. ? ? ?67 y o F with a chief complaints of vaginal bleeding.  Less than a normal menstrual cycle.  Benign exam here.  She is requesting STD treatment.  We will send off.  Have her follow-up with her primary care provider who she told me she has an appointment in 18 days.  Will give information for the women's clinic in case she chooses to see them. ? ?Wet prep with BV.  Will treat.  PCP follow-up. ? ?3:46 PM:  I have discussed the diagnosis/risks/treatment options with the patient.  Evaluation and diagnostic testing in the emergency department does not suggest an emergent condition requiring admission or immediate intervention beyond what has been performed at this time.  They will follow up with  GYN, PCP. We also discussed returning to the ED immediately if new or worsening sx occur. We discussed the sx which are most concerning (e.g., sudden worsening pain, fever, inability to tolerate by mouth, worsening bleeding, syncope) that necessitate immediate  return. Medications administered to the patient during their visit and any new prescriptions provided to the patient are listed below. ? ?Medications given during this visit ?Medications  ?ketorolac (TORADOL) 15 MG/ML injection 15 mg (15 mg Intramuscular Given 05/27/21 1545)  ? ? ? ?The patient appears reasonably screen and/or stabilized for discharge and I doubt any other medical condition or other [facility] requiring further screening, evaluation, or treatment in the ED at this time prior to discharge.  ? ? ? ? ? ? ? ? ?  Final Clinical Impression(s) / ED Diagnoses ?Final diagnoses:  ?Dysfunctional uterine bleeding  ? ? ?Rx / DC Orders ?ED Discharge Orders   ? ?      Ordered  ?  metroNIDAZOLE (FLAGYL) 500 MG tablet  2 times daily       ? 05/27/21 1544  ? ?  ?  ? ?  ? ? ?  ?Deno Etienne, DO ?05/27/21 1546 ? ?

## 2021-05-27 NOTE — ED Triage Notes (Signed)
Pt reports last menstrual cycle started on 3/24 and continues to have spotting which is not normal for her. Also reports lower midline abd and back pain, and sweating. Cannot tell if she is having any discharge. Had sex with a new partner recently.  ?

## 2021-05-27 NOTE — Discharge Instructions (Signed)
Your swab came back with a bacteria that is known to sometimes cause issues in the vagina.  Typically we treat you with antibiotics for this.  Please follow-up with your primary care provider and OB/GYN if able.  Return for worsening bleeding worsening pain fever ?

## 2021-05-27 NOTE — ED Notes (Signed)
ED Provider at bedside. 

## 2021-05-28 LAB — RPR: RPR Ser Ql: NONREACTIVE

## 2021-05-29 LAB — GC/CHLAMYDIA PROBE AMP (~~LOC~~) NOT AT ARMC
Chlamydia: NEGATIVE
Comment: NEGATIVE
Comment: NORMAL
Neisseria Gonorrhea: NEGATIVE

## 2021-06-01 ENCOUNTER — Other Ambulatory Visit: Payer: Self-pay

## 2021-06-01 ENCOUNTER — Encounter (HOSPITAL_BASED_OUTPATIENT_CLINIC_OR_DEPARTMENT_OTHER): Payer: Self-pay | Admitting: Emergency Medicine

## 2021-06-01 ENCOUNTER — Emergency Department (HOSPITAL_BASED_OUTPATIENT_CLINIC_OR_DEPARTMENT_OTHER)
Admission: EM | Admit: 2021-06-01 | Discharge: 2021-06-01 | Disposition: A | Discharge status: Home / Self Care | Payer: 59 | Attending: Emergency Medicine | Admitting: Emergency Medicine

## 2021-06-01 ENCOUNTER — Emergency Department (HOSPITAL_BASED_OUTPATIENT_CLINIC_OR_DEPARTMENT_OTHER): Payer: 59

## 2021-06-01 DIAGNOSIS — R5383 Other fatigue: Secondary | ICD-10-CM

## 2021-06-01 DIAGNOSIS — M791 Myalgia, unspecified site: Secondary | ICD-10-CM | POA: Diagnosis not present

## 2021-06-01 DIAGNOSIS — M79642 Pain in left hand: Secondary | ICD-10-CM

## 2021-06-01 DIAGNOSIS — R030 Elevated blood-pressure reading, without diagnosis of hypertension: Secondary | ICD-10-CM | POA: Diagnosis not present

## 2021-06-01 DIAGNOSIS — Z20822 Contact with and (suspected) exposure to covid-19: Secondary | ICD-10-CM | POA: Diagnosis not present

## 2021-06-01 DIAGNOSIS — E119 Type 2 diabetes mellitus without complications: Secondary | ICD-10-CM

## 2021-06-01 DIAGNOSIS — Z91040 Latex allergy status: Secondary | ICD-10-CM | POA: Diagnosis not present

## 2021-06-01 LAB — CBC WITH DIFFERENTIAL/PLATELET
Abs Immature Granulocytes: 0.02 10*3/uL (ref 0.00–0.07)
Basophils Absolute: 0 10*3/uL (ref 0.0–0.1)
Basophils Relative: 0 %
Eosinophils Absolute: 0.1 10*3/uL (ref 0.0–0.5)
Eosinophils Relative: 1 %
HCT: 36.3 % (ref 36.0–46.0)
Hemoglobin: 12.6 g/dL (ref 12.0–15.0)
Immature Granulocytes: 0 %
Lymphocytes Relative: 33 %
Lymphs Abs: 2.9 10*3/uL (ref 0.7–4.0)
MCH: 31.9 pg (ref 26.0–34.0)
MCHC: 34.7 g/dL (ref 30.0–36.0)
MCV: 91.9 fL (ref 80.0–100.0)
Monocytes Absolute: 0.4 10*3/uL (ref 0.1–1.0)
Monocytes Relative: 5 %
Neutro Abs: 5.4 10*3/uL (ref 1.7–7.7)
Neutrophils Relative %: 61 %
Platelets: 380 10*3/uL (ref 150–400)
RBC: 3.95 MIL/uL (ref 3.87–5.11)
RDW: 13.4 % (ref 11.5–15.5)
WBC: 8.9 10*3/uL (ref 4.0–10.5)
nRBC: 0 % (ref 0.0–0.2)

## 2021-06-01 LAB — COMPREHENSIVE METABOLIC PANEL
ALT: 25 U/L (ref 0–44)
AST: 27 U/L (ref 15–41)
Albumin: 3.5 g/dL (ref 3.5–5.0)
Alkaline Phosphatase: 37 U/L — ABNORMAL LOW (ref 38–126)
Anion gap: 8 (ref 5–15)
BUN: 7 mg/dL (ref 6–20)
CO2: 24 mmol/L (ref 22–32)
Calcium: 8.8 mg/dL — ABNORMAL LOW (ref 8.9–10.3)
Chloride: 105 mmol/L (ref 98–111)
Creatinine, Ser: 0.91 mg/dL (ref 0.44–1.00)
GFR, Estimated: >60 mL/min (ref >60)
Glucose, Bld: 191 mg/dL — ABNORMAL HIGH (ref 70–99)
Potassium: 3.8 mmol/L (ref 3.5–5.1)
Sodium: 137 mmol/L (ref 135–145)
Total Bilirubin: 0.4 mg/dL (ref 0.3–1.2)
Total Protein: 7.4 g/dL (ref 6.5–8.1)

## 2021-06-01 LAB — URINALYSIS, ROUTINE W REFLEX MICROSCOPIC
Bilirubin Urine: NEGATIVE
Glucose, UA: NEGATIVE mg/dL
Hgb urine dipstick: NEGATIVE
Ketones, ur: NEGATIVE mg/dL
Nitrite: NEGATIVE
Protein, ur: NEGATIVE mg/dL
Specific Gravity, Urine: >=1.03 (ref 1.005–1.030)
pH: 6 (ref 5.0–8.0)

## 2021-06-01 LAB — URINALYSIS, MICROSCOPIC (REFLEX)

## 2021-06-01 LAB — PREGNANCY, URINE: Preg Test, Ur: NEGATIVE

## 2021-06-01 LAB — CBG MONITORING, ED: Glucose-Capillary: 202 mg/dL — ABNORMAL HIGH (ref 70–99)

## 2021-06-01 LAB — RESP PANEL BY RT-PCR (FLU A&B, COVID) ARPGX2
Influenza A by PCR: NEGATIVE
Influenza B by PCR: NEGATIVE
SARS Coronavirus 2 by RT PCR: NEGATIVE

## 2021-06-01 NOTE — ED Provider Notes (Signed)
?[facility] EMERGENC[facility] DEPARTMENT ?Provider Note ? ? ?CSN: [id number] ?Arrival date & time: 06/01/21  1611 ? ?  ? ?History ? ?Chief Complaint  ?Patient presents with  ? Generalized Body Aches  ? ? ?Colleen Downs is a 41 y.o. female who presents to the ED today with multiple complaints. Pt reports she has been out of her diabetes medications since December 2022. She complains of increased thirst. Denies polyuria or polyphagia. She states that she has an appointment on the 18th of this month to establish care with new PCP. She states that she has been feeling generally weak and fatigued. She also complains of pins and needles sensation to bilateral hands and feet x a couple of months. She mentions that her left hand is swollen and painful for the past few days - she has been dealing with this intermittently for several months however most recent episode for the past 3-4 days. Denies any trauma to same. Has not tried any Ibuprofen, Tylenol, ice, heat, elevation, or any remedies prior to coming to the ED.  ? ?Per chart review she was seen in the ED on 04/01 for vaginal bleeding. She tested positive for BV and was discharged home with Flagyl.  ? ?The history is provided by the patient and medical records.  ? ?  ? ?Home Medications ?Prior to Admission medications   ?Medication Sig Start Date End Date Taking? Authorizing Provider  ?albuterol (VENTOLIN HFA) 108 (90 Base) MCG/ACT inhaler Inhale 1-2 puffs into the lungs every 6 (six) hours as needed for wheezing or shortness of breath. 11/17/20   Long, Wonda Olds, MD  ?atorvastatin (LIPITOR) 10 MG tablet TAKE 1 TABLET (10 MG TOTAL) BY MOUTH DAILY. 09/26/20 09/26/21  Elsie Stain, MD  ?benzonatate (TESSALON) 100 MG capsule Take 1 capsule (100 mg total) by mouth 3 (three) times daily as needed for cough. 11/17/20   Long, Wonda Olds, MD  ?Blood Glucose Monitoring Suppl (TRUE METRIX METER) w/Device KIT Use to check blood sugar as directed. 05/17/20   Elsie Stain, MD   ?dapagliflozin propanediol (FARXIGA) 10 MG TABS tablet Take 1 tablet (10 mg total) by mouth daily before breakfast. To lower blood sugar 09/26/20   Elsie Stain, MD  ?metroNIDAZOLE (FLAGYL) 500 MG tablet Take 1 tablet (500 mg total) by mouth 2 (two) times daily. 05/27/21   Deno Etienne, DO  ?sertraline (ZOLOFT) 50 MG tablet Take 1 tablet (50 mg total) by mouth daily. 09/26/20   Elsie Stain, MD  ?Vitamin D, Ergocalciferol, (DRISDOL) 1.25 MG (50000 UNIT) CAPS capsule TAKE 1 CAPSULE (50,000 UNITS TOTAL) BY MOUTH EVERY 7 (SEVEN) DAYS. 09/26/20 09/26/21  Elsie Stain, MD  ?famotidine (PEPCID) 20 MG tablet Take 1 tablet (20 mg total) by mouth 2 (two) times daily. ?Patient not taking: Reported on 05/17/2020 01/04/20 05/17/20  Antony Blackbird, MD  ?   ? ?Allergies    ?Sulfa drugs cross reactors and Latex   ? ?Review of Systems   ?Review of Systems  ?Constitutional:  Positive for fatigue. Negative for chills and fever.  ?Respiratory:  Negative for cough and shortness of breath.   ?Cardiovascular:  Negative for chest pain.  ?Gastrointestinal:  Negative for abdominal pain.  ?Endocrine: Positive for polydipsia. Negative for polyphagia and polyuria.  ?Genitourinary:  Positive for vaginal discharge.  ?Musculoskeletal:  Positive for arthralgias and joint swelling.  ?Skin:  Negative for color change and wound.  ?Neurological:  Positive for weakness (generalized).  ?All other systems reviewed and are  negative. ? ?Physical Exam ?Updated Vital Signs ?BP 130/62 (BP Location: Right Arm)   Pulse 62   Temp 98.5 ?F (36.9 ?C) (Oral)   Resp 16   Ht $R'5\' 6"'DV$  (1.676 m)   Wt 111.1 kg   SpO2 99%   BMI 39.54 kg/m?  ?Physical Exam ?Vitals and nursing note reviewed.  ?Constitutional:   ?   Appearance: She is obese. She is not ill-appearing or diaphoretic.  ?HENT:  ?   Head: Normocephalic and atraumatic.  ?Eyes:  ?   Conjunctiva/sclera: Conjunctivae normal.  ?Cardiovascular:  ?   Rate and Rhythm: Normal rate and regular rhythm.  ?   Pulses:  Normal pulses.  ?Pulmonary:  ?   Effort: Pulmonary effort is normal.  ?   Breath sounds: Normal breath sounds. No wheezing, rhonchi or rales.  ?Abdominal:  ?   Palpations: Abdomen is soft.  ?   Tenderness: There is no abdominal tenderness. There is no guarding or rebound.  ?Musculoskeletal:  ?   Cervical back: Neck supple.  ?   Comments: Mild swelling noted to L hand with associated TTP. No overlying skin changes including erythema or increased warmth. Cap refill < 2 seconds to all digits. No TTP to wrist. ROM intact to wrist. 2+ radial pulse. No swelling proximally to hand.   ?Skin: ?   General: Skin is warm and dry.  ?Neurological:  ?   Mental Status: She is alert.  ? ? ?ED Results / Procedures / Treatments   ?Labs ?(all labs ordered are listed, but only abnormal results are displayed) ?Labs Reviewed  ?COMPREHENSIVE METABOLIC PANEL - Abnormal; Notable for the following components:  ?    Result Value  ? Glucose, Bld 191 (*)   ? Calcium 8.8 (*)   ? Alkaline Phosphatase 37 (*)   ? All other components within normal limits  ?URINALYSIS, ROUTINE W REFLEX MICROSCOPIC - Abnormal; Notable for the following components:  ? APPearance HAZY (*)   ? Leukocytes,Ua TRACE (*)   ? All other components within normal limits  ?URINALYSIS, MICROSCOPIC (REFLEX) - Abnormal; Notable for the following components:  ? Bacteria, UA MANY (*)   ? All other components within normal limits  ?CBG MONITORING, ED - Abnormal; Notable for the following components:  ? Glucose-Capillary 202 (*)   ? All other components within normal limits  ?RESP PANEL BY RT-PCR (FLU A&B, COVID) ARPGX2  ?CBC WITH DIFFERENTIAL/PLATELET  ?PREGNANCY, URINE  ? ? ?EKG ?None ? ?Radiology ?No results found. ? ?Procedures ?Procedures  ? ? ?Medications Ordered in ED ?Medications - No data to display ? ?ED Course/ Medical Decision Making/ A&P ?  ?                        ?Medical Decision Making ?41 year old female presents to the ED today with multiple complaints.  Reports being  off of diabetic medications times several months and experiencing polydipsia with same.  Also complains of pins and needle sensation in her hands and her feet, question diabetic neuropathy.  She notes increased fatigue, generalized weakness, atraumatic left hand swelling and pain.  Recently seen for vaginal bleeding and diagnosed with bacterial vaginosis, currently taking Flagyl.  On arrival to the ED today vitals are stable.  Blood pressure only slightly elevated at 142/90.  She is noted to have some very mild left hand swelling with associated tenderness palpation throughout.  She is neurovascularly intact.  Swelling does not appear to extend to the forearm,  upper arm to suggest DVT at this time.  She has equal strength and sensation to her bilateral upper and bilateral lower extremities.  Denies any migration of the pins and needle sensation in her hands and feet to suggest Guillain-Barr? syndrome.  Does not appointment with PCP to establish care on the 18th of this month.  We will plan for lab work at this time including CBG, CBC, CMP, UA, urine pride as well as COVID/flu testing given complaint of generalized body aches.  We will plan for x-ray of the left hand as well. ? ?Work-up reassuring at this time.  Glucose at 191 on BMP without concern for DKA at this time.  No other electrolyte abnormalities.  Nursing staff informed me that patient has refused the x-ray of her left hand and became upset when asked regarding same.  When I was initially questioning patient she also became irritated with me asking questions regarding her left hand.  Given she is refusing x-ray at this time will ultimately plan to discharge with instructions for ibuprofen and Tylenol as needed for pain.  Patient has an appointment with PCP in the next 1 to 2 weeks, advised to keep.  She can discuss medication changes with her at that time.  Do not feel patient requires additional work-up in the ED today.  Stable for discharge. ? ?Problems  Addressed: ?Fatigue, unspecified type: acute illness or injury ?Left hand pain: acute illness or injury ?Type 2 diabetes mellitus without complication, without long-term current use of insulin ([place]): acute illness or i

## 2021-06-01 NOTE — ED Triage Notes (Signed)
Pt reports body aches, left hand swelling, and weakness. Pt reports being dx with BV on Saturday.  ?

## 2021-06-01 NOTE — Discharge Instructions (Signed)
Your workup was overall reassuring in the ED today. Your labwork did not show any significant abnormalities. A COVID and flu test have been collected on you and are pending - you can log into MyChart and check the results.  ? ?You have declined an xray of your L hand at this time. We are unable to properly diagnose or treat without imaging. Please take Ibuprofen and Tylenol as needed for pain. Elevate your hand to help reduce swelling and apply ice as needed.  ? ?Keep your appointment with your new PCP as scheduled in the next 2 weeks.  ? ?Return to the ED for any new/worsening symptoms ?

## 2021-06-01 NOTE — ED Notes (Signed)
Pt is refusing the xray , would not let me put blood pressure  cuff on it ?

## 2021-06-09 ENCOUNTER — Ambulatory Visit: Payer: 59 | Admitting: Family Medicine

## 2021-06-13 ENCOUNTER — Ambulatory Visit (INDEPENDENT_AMBULATORY_CARE_PROVIDER_SITE_OTHER): Payer: 59 | Admitting: Family Medicine

## 2021-06-13 ENCOUNTER — Encounter: Payer: Self-pay | Admitting: Family Medicine

## 2021-06-13 VITALS — BP 120/67 | HR 64 | Ht 66.0 in | Wt 240.0 lb

## 2021-06-13 DIAGNOSIS — L723 Sebaceous cyst: Secondary | ICD-10-CM | POA: Diagnosis not present

## 2021-06-13 DIAGNOSIS — E782 Mixed hyperlipidemia: Secondary | ICD-10-CM

## 2021-06-13 DIAGNOSIS — R5383 Other fatigue: Secondary | ICD-10-CM

## 2021-06-13 DIAGNOSIS — E66813 Obesity, class 3: Secondary | ICD-10-CM

## 2021-06-13 DIAGNOSIS — E1169 Type 2 diabetes mellitus with other specified complication: Secondary | ICD-10-CM

## 2021-06-13 DIAGNOSIS — E785 Hyperlipidemia, unspecified: Secondary | ICD-10-CM | POA: Diagnosis not present

## 2021-06-13 DIAGNOSIS — Z6841 Body Mass Index (BMI) 40.0 and over, adult: Secondary | ICD-10-CM | POA: Diagnosis not present

## 2021-06-13 DIAGNOSIS — F331 Major depressive disorder, recurrent, moderate: Secondary | ICD-10-CM

## 2021-06-13 DIAGNOSIS — E559 Vitamin D deficiency, unspecified: Secondary | ICD-10-CM | POA: Diagnosis not present

## 2021-06-13 MED ORDER — BLOOD GLUCOSE MONITOR KIT: PACK | 0 refills | Status: DC
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
## 2021-06-13 NOTE — Assessment & Plan Note (Signed)
Stable, no acute concerns. No SI/HI. Given her history and trouble with other medications she has tried, she prefers to get evaluated by psych before trying anything new at this time - referral placed. Reports she has a good support system. Patient aware of signs/symptoms requiring further/urgent evaluation.  ?

## 2021-06-13 NOTE — Assessment & Plan Note (Signed)
Previously stable with last A1c 7.3% in August 2022.  ?Off of medications for the past 4 months; updating labs today and consider starting Ozempic.  ?Discussed diet and exercise ?F/u in in 3 months or sooner pending results ? ?

## 2021-06-13 NOTE — Assessment & Plan Note (Signed)
Updating labs. Will consider Ozempic pending A1c. Focus on heart healthy, low-carb diet, portion control, regular exercise.  ?

## 2021-06-13 NOTE — Assessment & Plan Note (Signed)
HLD PLAN: ?-No recent lipid panel to review ?-Repeat CMP and lipid panel today ?-Diet low in saturated fat ?-Regular exercise - at least 30 minutes, 5 times per week ? ?

## 2021-06-13 NOTE — Patient Instructions (Signed)
Thank you for choosing [facility] for your Primary Care needs. I am excited for the opportunity to partner with you to meet your health care goals. It was a pleasure meeting you today! ? ? ?Information on diet, exercise, and health maintenance recommendations are listed below. This is information to help you be sure you are on track for optimal health and monitoring.  ? ?Please look over this and let us know if you have any questions or if you have completed any of the health maintenance outside of [facility] so that we can be sure your records are up to date.  ?___________________________________________________________ ? ?MyChart:  ?For all urgent or time sensitive needs we ask that you please call the office to avoid delays. Our number is [phone]. ?MyChart is not constantly monitored and due to the large volume of messages a day, replies may take up to 72 business hours. ? ?MyChart Policy: ?MyChart allows for you to see your visit notes, after visit summary, provider recommendations, lab and tests results, make an appointment, request refills, and contact your provider or the office for non-urgent questions or concerns. Providers are seeing patients during normal business hours and do not have built in time to review MyChart messages.  ?We ask that you allow a minimum of 3 business days for responses to MyChart messages. For this reason, please do not send urgent requests through MyChart. Please call the office at [phone]. ?New and ongoing conditions may require a visit. We have virtual and in-person visits available for your convenience.  ?Complex MyChart concerns may require a visit. Your provider may request you schedule a virtual or in-person visit to ensure we are providing the best care possible. ?MyChart messages sent after 11:00 AM on Friday will not be received by the provider until Monday morning.  ?  ?Lab and Test Results: ?You will receive your lab and  test results on MyChart as soon as they are completed and results have been sent by the lab or testing facility. Due to this service, you will receive your results BEFORE your provider.  ?I review lab and test results each morning prior to seeing patients. Some results require collaboration with other providers to ensure you are receiving the most appropriate care. For this reason, we ask that you please allow a minimum of 3-5 business days from the time that ALL results have been received for your provider to receive and review lab and test results and contact you about these.  ?Most lab and test result comments from the provider will be sent through MyChart. Your provider may recommend changes to the plan of care, follow-up visits, repeat testing, ask questions, or request an office visit to discuss these results. You may reply directly to this message or call the office to provide information for the provider or set up an appointment. ?In some instances, you will be called with test results and recommendations. Please let us know if this is preferred and we will make note of this in your chart to provide this for you.    ?If you have not heard a response to your lab or test results in 5 business days from all results returning to MyChart, please call the office to let us know. We ask that you please avoid calling prior to this time unless there is an emergent concern. Due to high call volumes, this can delay the resulting process. ? ?After Hours: ?For all non-emergency after hours needs,   please call the office at [phone] and select the option to reach the on-call  service. On-call services are shared between multiple [facility] offices and therefore it will not be possible to speak directly with your provider. On-call providers may provide medical advice and recommendations, but are unable to provide refills for maintenance medications.  ?For all emergency or urgent medical needs after normal business  hours, we recommend that you seek care at the closest Urgent Care or Emergency Department to ensure appropriate treatment in a timely manner.  ?[facility] has a 24 hour emergency room located on the ground floor for your convenience.  ? ?Urgent Concerns During the Business Day ?Providers are seeing patients from 8AM to 5PM with a busy schedule and are most often not able to respond to non-urgent calls until the end of the day or the next business day. ?If you should have URGENT concerns during the day, please call and speak to the nurse or schedule a same day appointment so that we can address your concern without delay.  ? ?Thank you, again, for choosing me as your health care partner. I appreciate your trust and look forward to learning more about you.  ? ?Alysiana Ethridge B. Hymen Arnett, DNP, FNP-C ? ?___________________________________________________________ ? ?Health Maintenance Recommendations ?Screening Testing ?Mammogram ?Every 1-2 years based on history and risk factors ?Starting at age 50 ?Pap Smear ?Ages 21-39 every 3 years ?Ages 30-65 every 5 years with HPV testing ?More frequent testing may be required based on results and history ?Colon Cancer Screening ?Every 1-10 years based on test performed, risk factors, and history ?Starting at age 45 ?Bone Density Screening ?Every 2-10 years based on history ?Starting at age 65 for women ?Recommendations for men differ based on medication usage, history, and risk factors ?AAA Screening ?One time ultrasound ?Men 65-75 years old who have ever smoked ?Lung Cancer Screening ?Low Dose Lung CT every 12 months ?Age 50-80 years with a 20 pack-year smoking history who still smoke or who have quit within the last 15 years ? ?Screening Labs ?Routine  Labs: Complete Blood Count (CBC), Complete Metabolic Panel (CMP), Cholesterol (Lipid Panel) ?Every 6-12 months based on history and medications ?May be recommended more frequently based on current conditions or  previous results ?Hemoglobin A1c Lab ?Every 3-12 months based on history and previous results ?Starting at age 45 or earlier with diagnosis of diabetes, high cholesterol, BMI >26, and/or risk factors ?Frequent monitoring for patients with diabetes to ensure blood sugar control ?Thyroid Panel (TSH w/ T3 & T4) ?Every 6 months based on history, symptoms, and risk factors ?May be repeated more often if on medication ?HIV ?One time testing for all patients 13 and older ?May be repeated more frequently for patients with increased risk factors or exposure ?Hepatitis C ?One time testing for all patients 18 and older ?May be repeated more frequently for patients with increased risk factors or exposure ?Gonorrhea, Chlamydia ?Every 12 months for all sexually active persons 13-24 years ?Additional monitoring may be recommended for those who are considered high risk or who have symptoms ?PSA ?Men 40-54 years old with risk factors ?Additional screening may be recommended from age 55-69 based on risk factors, symptoms, and history ? ?Vaccine Recommendations ?Tetanus Booster ?All adults every 10 years ?Flu Vaccine ?All patients 6 months and older every year ?COVID Vaccine ?All patients 12 years and older ?Initial dosing with booster ?May recommend additional booster based on age and health history ?HPV Vaccine ?2 doses all patients   age 9-26 ?Dosing may be considered for patients over 26 ?Shingles Vaccine (Shingrix) ?2 doses all adults 50 years and older ?Pneumonia (Pneumovax 23) ?All adults 65 years and older ?May recommend earlier dosing based on health history ?Pneumonia (Prevnar 13) ?All adults 65 years and older ?Dosed 1 year after Pneumovax 23 ?Pneumonia (Prevnar 20) ?All adults 65 years and older (adults 19-64 with certain conditions or risk factors) ?1 dose  ?For those who have no received Prevnar 13 vaccine previously ? ? ?Additional Screening, Testing, and Vaccinations may be recommended on an individualized basis based on  family history, health history, risk factors, and/or exposure.  ?__________________________________________________________ ? ?Diet Recommendations for All Patients ? ?I recommend that all patients maintain a diet

## 2021-06-13 NOTE — Assessment & Plan Note (Signed)
Not recently checked. Updating labs today. She has not been on any supplements in several months.  ?

## 2021-06-14 ENCOUNTER — Ambulatory Visit: Payer: 59 | Admitting: Family Medicine

## 2021-06-14 LAB — MICROALBUMIN / CREATININE URINE RATIO
Creatinine,U: 199.5 mg/dL
Microalb Creat Ratio: 0.7 mg/g (ref 0.0–30.0)
Microalb, Ur: 1.5 mg/dL (ref 0.0–1.9)

## 2021-06-14 LAB — COMPREHENSIVE METABOLIC PANEL
ALT: 16 U/L (ref 0–35)
AST: 13 U/L (ref 0–37)
Albumin: 4.2 g/dL (ref 3.5–5.2)
Alkaline Phosphatase: 35 U/L — ABNORMAL LOW (ref 39–117)
BUN: 7 mg/dL (ref 6–23)
CO2: 27 mEq/L (ref 19–32)
Calcium: 9.5 mg/dL (ref 8.4–10.5)
Chloride: 103 mEq/L (ref 96–112)
Creatinine, Ser: 0.75 mg/dL (ref 0.40–1.20)
GFR: 99.5 mL/min (ref >60.00)
Glucose, Bld: 112 mg/dL — ABNORMAL HIGH (ref 70–99)
Potassium: 4.2 mEq/L (ref 3.5–5.1)
Sodium: 136 mEq/L (ref 135–145)
Total Bilirubin: 0.4 mg/dL (ref 0.2–1.2)
Total Protein: 7.3 g/dL (ref 6.0–8.3)

## 2021-06-14 LAB — LIPID PANEL
Cholesterol: 230 mg/dL — ABNORMAL HIGH (ref 0–200)
HDL: 44.7 mg/dL (ref >39.00)
LDL Cholesterol: 152 mg/dL — ABNORMAL HIGH (ref 0–99)
NonHDL: 184.88
Total CHOL/HDL Ratio: 5
Triglycerides: 162 mg/dL — ABNORMAL HIGH (ref 0.0–149.0)
VLDL: 32.4 mg/dL (ref 0.0–40.0)

## 2021-06-14 LAB — CBC
HCT: 38.8 % (ref 36.0–46.0)
Hemoglobin: 12.9 g/dL (ref 12.0–15.0)
MCHC: 33.2 g/dL (ref 30.0–36.0)
MCV: 94.4 fl (ref 78.0–100.0)
Platelets: 355 10*3/uL (ref 150.0–400.0)
RBC: 4.11 Mil/uL (ref 3.87–5.11)
RDW: 14.2 % (ref 11.5–15.5)
WBC: 9.1 10*3/uL (ref 4.0–10.5)

## 2021-06-14 LAB — VITAMIN D 25 HYDROXY (VIT D DEFICIENCY, FRACTURES): VITD: 13.31 ng/mL — ABNORMAL LOW (ref 30.00–100.00)

## 2021-06-14 LAB — TSH: TSH: 1.04 u[IU]/mL (ref 0.35–5.50)

## 2021-06-14 LAB — HEMOGLOBIN A1C: Hgb A1c MFr Bld: 8.2 % — ABNORMAL HIGH (ref 4.6–6.5)

## 2021-06-14 MED ORDER — VITAMIN D (ERGOCALCIFEROL) 1.25 MG (50000 UNIT) PO CAPS: 50000.0000 [IU] | ORAL_CAPSULE | ORAL | 0 refills | Status: DC

## 2021-06-14 MED ORDER — ATORVASTATIN CALCIUM 10 MG PO TABS: 10.0000 mg | ORAL_TABLET | Freq: Every day | ORAL | 3 refills | Status: DC

## 2021-06-14 MED ORDER — OZEMPIC (0.25 OR 0.5 MG/DOSE) 2 MG/1.5ML ~~LOC~~ SOPN: 0.2500 mg | PEN_INJECTOR | SUBCUTANEOUS | 1 refills | Status: DC

## 2021-06-14 NOTE — Addendum Note (Signed)
Addended by: Caleen Jobs B on: 06/14/2021 04:30 PM ? ? Modules accepted: Orders ? ?

## 2021-06-15 ENCOUNTER — Telehealth: Payer: Self-pay

## 2021-06-15 NOTE — Telephone Encounter (Signed)
PA initiated via Covermymeds; KEY; [id number]. Awaiting determination.  ?

## 2021-06-16 NOTE — Telephone Encounter (Signed)
Records faxed.

## 2021-06-16 NOTE — Telephone Encounter (Signed)
PA approved.  ? ?PA Case: [id number], Status: Approved, Coverage Starts on: 06/15/2021 12:00 AM, Coverage Ends on: 06/16/2022 12:00 AM. Questions? Contact [phone]. ?

## 2021-06-16 NOTE — Telephone Encounter (Signed)
Notes faxed.

## 2021-06-16 NOTE — Telephone Encounter (Signed)
[facility] is calling stating they need clinical notes in regards of the medication. Call back number is [phone]. Fax number [phone].  ?

## 2021-07-12 ENCOUNTER — Ambulatory Visit: Payer: 59 | Admitting: Family Medicine

## 2021-07-13 ENCOUNTER — Ambulatory Visit (INDEPENDENT_AMBULATORY_CARE_PROVIDER_SITE_OTHER): Payer: 59 | Admitting: Family Medicine

## 2021-07-13 VITALS — BP 94/64 | HR 79 | Temp 98.2°F | Resp 16 | Wt 242.0 lb

## 2021-07-13 DIAGNOSIS — E1140 Type 2 diabetes mellitus with diabetic neuropathy, unspecified: Secondary | ICD-10-CM

## 2021-07-13 DIAGNOSIS — H539 Unspecified visual disturbance: Secondary | ICD-10-CM | POA: Diagnosis not present

## 2021-07-13 DIAGNOSIS — R110 Nausea: Secondary | ICD-10-CM

## 2021-07-13 MED ORDER — ONDANSETRON 8 MG PO TBDP: 8.0000 mg | ORAL_TABLET | Freq: Three times a day (TID) | ORAL | 3 refills | Status: DC | PRN

## 2021-07-13 MED ORDER — GABAPENTIN 100 MG PO CAPS: ORAL_CAPSULE | ORAL | 11 refills | Status: DC
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
## 2021-07-13 NOTE — Patient Instructions (Addendum)
Referral for cyst removal: Sent to [facility], [place], [place] [address] Phone: [phone]  New eye doctor referral placed so we can hopefully find someone to see you sooner. We will try adding back the gabapentin for your nerve pain. Also adding some occasional Zofran for you to use with the nausea following Ozempic shots. Keep your upcoming appointments.    **If you develop any thoughts of hurting yourself, go to the nearest emergency [facility] - Urgent care services - Outpatient services: Individual Therapy Partial Hospitalization Program (PHP) Substance Abuse Intensive Outpatient Program [facility]) Specialized Intensive Adult Group Therapy Medication Management Peer Living Room  Phone: [phone]  Address: [address], [place] [address]  Hours: Open 24/7, No appointment required.

## 2021-07-14 ENCOUNTER — Encounter: Payer: Self-pay | Admitting: Family Medicine

## 2021-07-15 ENCOUNTER — Other Ambulatory Visit: Payer: Self-pay | Admitting: Family Medicine

## 2021-07-15 DIAGNOSIS — E559 Vitamin D deficiency, unspecified: Secondary | ICD-10-CM

## 2021-07-17 MED ORDER — VITAMIN D (ERGOCALCIFEROL) 1.25 MG (50000 UNIT) PO CAPS: 50000.0000 [IU] | ORAL_CAPSULE | ORAL | 0 refills | Status: DC

## 2021-08-11 ENCOUNTER — Other Ambulatory Visit: Payer: Self-pay

## 2021-08-11 ENCOUNTER — Telehealth: Payer: Self-pay | Admitting: Family Medicine

## 2021-08-11 DIAGNOSIS — E1169 Type 2 diabetes mellitus with other specified complication: Secondary | ICD-10-CM

## 2021-08-11 MED ORDER — OZEMPIC (0.25 OR 0.5 MG/DOSE) 2 MG/1.5ML ~~LOC~~ SOPN: 0.5000 mg | PEN_INJECTOR | SUBCUTANEOUS | 3 refills | Status: DC

## 2021-08-11 NOTE — Telephone Encounter (Signed)
LMOM notifying pt of medication sent.

## 2021-08-11 NOTE — Telephone Encounter (Signed)
Patient switched insurance companies and needs ozempic sent in  Patient would like for someone to call her back ASAP

## 2021-08-12 ENCOUNTER — Other Ambulatory Visit: Payer: Self-pay | Admitting: Family Medicine

## 2021-08-12 DIAGNOSIS — E559 Vitamin D deficiency, unspecified: Secondary | ICD-10-CM

## 2021-08-15 NOTE — Telephone Encounter (Signed)
Patient states because of her insurance she needs a PA for it and is not sure if one was sent already. She would like a call back to discuss what happens if the medication is denied. Please advise.

## 2021-08-16 ENCOUNTER — Ambulatory Visit (INDEPENDENT_AMBULATORY_CARE_PROVIDER_SITE_OTHER): Payer: Commercial Managed Care - HMO | Admitting: Family Medicine

## 2021-08-16 ENCOUNTER — Encounter: Payer: Self-pay | Admitting: Family Medicine

## 2021-08-16 VITALS — BP 117/62 | HR 64 | Ht 66.0 in | Wt 238.8 lb

## 2021-08-16 DIAGNOSIS — E1169 Type 2 diabetes mellitus with other specified complication: Secondary | ICD-10-CM | POA: Diagnosis not present

## 2021-08-16 DIAGNOSIS — E559 Vitamin D deficiency, unspecified: Secondary | ICD-10-CM

## 2021-08-16 DIAGNOSIS — L299 Pruritus, unspecified: Secondary | ICD-10-CM | POA: Diagnosis not present

## 2021-08-16 MED ORDER — VITAMIN D3 25 MCG (1000 UT) PO CAPS: 1000.0000 [IU] | ORAL_CAPSULE | Freq: Every day | ORAL | 1 refills | Status: DC

## 2021-08-16 MED ORDER — TRIAMCINOLONE ACETONIDE 0.5 % EX CREA: 1.0000 | TOPICAL_CREAM | Freq: Two times a day (BID) | CUTANEOUS | 3 refills | Status: DC

## 2021-08-16 NOTE — Telephone Encounter (Signed)
Submitted PA with [employer] for Ozempic. (Key: [id number])

## 2021-08-16 NOTE — Patient Instructions (Addendum)
For your itch rash - try the prescription steroid cream twice a day for 7-10 days (we don't want to have to use longer than 10 days per month). Continue with gentle (fragrance/dye-free lotions and moisturizers). Start taking a daily Zyrtec, especially if you are going to be around animals you are allergic to.   We will try to resubmit the Ozempic paperwork for your new insurance.

## 2021-08-16 NOTE — Assessment & Plan Note (Signed)
Done with weekly supplementation. Sending in daily supplement (states she cannot afford buying OTC, so would prefer to see if insurance will pay this way).

## 2021-08-16 NOTE — Progress Notes (Signed)
Established Patient Office Visit  Subjective   [name], female    DOB: 1980-03-31  Age: 41 y.o. MRN: [id number]  CC: Ozempic insurance issue and rash to arms   HPI  Reports her insurance was recently changed by the marketplace from Friday to [place]. She has been taking Ozempic for 1 month with good improvement in glucose levels, but new insurance is not covering it. She gave [place] information so we can submit a PA to [place].    She recently noticed an itchy rash to bilateral forearms. States it will occasionally get red. She denies any new lotions/detergents, etc, but does state that her aunt recently got a cat and she has a dog (patient allergic). She has tried benadryl cream, but didn't find that very helpful. She has a small area under her right breast that looks similar, but no significant erythema. Denies any other spread of rash, no edema, significant erythema, vesicles/pustules, drainage, pain, warmth, etc.       ROS All review of systems negative except what is listed in the HPI    Objective:     BP 117/62   Pulse 64   Ht '5\' 6"'$  (1.676 m)   Wt 238 lb 12.8 oz (108.3 kg)   BMI 38.54 kg/m    Physical Exam Vitals reviewed.  Constitutional:      Appearance: Normal appearance.  Skin:    Comments: Mild papular rash to bilateral forearms, minimal erythema, no abrasions, edema; no visible rash under right breast other than dry skin  Neurological:     General: No focal deficit present.     Mental Status: She is alert and oriented to person, place, and time. Mental status is at baseline.  Psychiatric:        Mood and Affect: Mood normal.        Behavior: Behavior normal.        Thought Content: Thought content normal.        Judgment: Judgment normal.      No results found for any visits on 08/16/21.    The 10-year ASCVD risk score (Arnett DK, et al., 2019) is: 1.6%    Assessment & Plan:   Problem List Items Addressed This Visit        Endocrine   Type 2 diabetes mellitus with morbid obesity ([place])    She had been doing really well with the Ozempic prior to insurance change. Will submit new PA to [employer] to check for coverage. Will update her when we know the plan.         Other   Vitamin D deficiency    Done with weekly supplementation. Sending in daily supplement (states she cannot afford buying OTC, so would prefer to see if insurance will pay this way).       Relevant Medications   Cholecalciferol (VITAMIN D3) 25 MCG (1000 UT) CAPS   Other Visit Diagnoses     Pruritic dermatitis    -  Primary For your itch rash - try the prescription steroid cream twice a day for 7-10 days (we don't want to have to use longer than 10 days per month). Continue with gentle (fragrance/dye-free lotions and moisturizers). Start taking a daily Zyrtec, especially if you are going to be around animals you are allergic to.    Relevant Medications   triamcinolone cream (KENALOG) 0.5 %       Return for - Keep upcoming July appt .    Terrilyn Saver,  NP

## 2021-08-16 NOTE — Assessment & Plan Note (Signed)
She had been doing really well with the Ozempic prior to insurance change. Will submit new PA to [employer] to check for coverage. Will update her when we know the plan.

## 2021-08-22 ENCOUNTER — Telehealth: Payer: Self-pay

## 2021-08-22 DIAGNOSIS — E1169 Type 2 diabetes mellitus with other specified complication: Secondary | ICD-10-CM

## 2021-08-23 MED ORDER — TRULICITY 0.75 MG/0.5ML ~~LOC~~ SOAJ: 0.7500 mg | SUBCUTANEOUS | 1 refills | Status: DC

## 2021-08-24 ENCOUNTER — Other Ambulatory Visit: Payer: Self-pay

## 2021-08-24 ENCOUNTER — Emergency Department (HOSPITAL_BASED_OUTPATIENT_CLINIC_OR_DEPARTMENT_OTHER)
Admission: EM | Admit: 2021-08-24 | Discharge: 2021-08-24 | Disposition: A | Discharge status: Home / Self Care | Payer: Commercial Managed Care - HMO | Attending: Emergency Medicine | Admitting: Emergency Medicine

## 2021-08-24 ENCOUNTER — Encounter (HOSPITAL_BASED_OUTPATIENT_CLINIC_OR_DEPARTMENT_OTHER): Payer: Self-pay | Admitting: Emergency Medicine

## 2021-08-24 DIAGNOSIS — J069 Acute upper respiratory infection, unspecified: Secondary | ICD-10-CM | POA: Insufficient documentation

## 2021-08-24 DIAGNOSIS — Z91040 Latex allergy status: Secondary | ICD-10-CM | POA: Insufficient documentation

## 2021-08-24 DIAGNOSIS — Z20822 Contact with and (suspected) exposure to covid-19: Secondary | ICD-10-CM | POA: Insufficient documentation

## 2021-08-24 DIAGNOSIS — R059 Cough, unspecified: Secondary | ICD-10-CM | POA: Diagnosis present

## 2021-08-24 DIAGNOSIS — H6983 Other specified disorders of Eustachian tube, bilateral: Secondary | ICD-10-CM | POA: Insufficient documentation

## 2021-08-24 DIAGNOSIS — E119 Type 2 diabetes mellitus without complications: Secondary | ICD-10-CM | POA: Diagnosis not present

## 2021-08-24 LAB — SARS CORONAVIRUS 2 BY RT PCR: SARS Coronavirus 2 by RT PCR: NEGATIVE

## 2021-08-24 MED ORDER — BENZONATATE 200 MG PO CAPS: 200.0000 mg | ORAL_CAPSULE | Freq: Three times a day (TID) | ORAL | 0 refills | Status: AC

## 2021-08-24 NOTE — Discharge Instructions (Signed)
Zyrtec, Flonase, natural tears (preservative-free eyedrops) for symptomatic relief.  Prescription for Tessalon sent to your pharmacy for cough.  Recheck with your PCP if symptoms persist.

## 2021-08-24 NOTE — ED Provider Notes (Signed)
[facility] Provider Note   CSN: [id number] Arrival date & time: 08/24/21  1330     History  Chief Complaint  Patient presents with   URI    Colleen Downs is a 41 y.o. female.  42 year old female with past medical history of diabetes, hyperlipidemia, neuropathy presents with complaint of head congestion ear pain which radiates towards her throat, cough and eye drainage.  Symptoms started 5 days ago.  Patient was going to give her symptoms more time to resolve however when she sneezed this morning she had loss of hearing out of her right ear.  This has since somewhat improved.  Patient is taking Benadryl and Tylenol multi cold relief at home with limited relief.       Home Medications Prior to Admission medications   Medication Sig Start Date End Date Taking? Authorizing Provider  benzonatate (TESSALON) 200 MG capsule Take 1 capsule (200 mg total) by mouth every 8 (eight) hours for 10 days. 08/24/21 09/03/21 Yes Tacy Learn, PA-C  atorvastatin (LIPITOR) 10 MG tablet Take 1 tablet (10 mg total) by mouth daily. 06/14/21   Terrilyn Saver, NP  blood glucose meter kit and supplies KIT Dispense based on patient and insurance preference. Use up to four times daily as directed. Please include lancets, test strips, control solution. 06/13/21   Terrilyn Saver, NP  Cholecalciferol (VITAMIN D3) 25 MCG (1000 UT) CAPS Take 1 capsule (1,000 Units total) by mouth daily. 08/16/21   Terrilyn Saver, NP  Dulaglutide (TRULICITY) 5.57 DU/2.0UR SOPN Inject 0.75 mg into the skin once a week. 08/23/21   Terrilyn Saver, NP  gabapentin (NEURONTIN) 100 MG capsule One tab PO qHS for a week, then 2 tabs nightly for a week, then if needed go up to 300 mg nightly 07/13/21   Terrilyn Saver, NP  ondansetron (ZOFRAN-ODT) 8 MG disintegrating tablet Take 1 tablet (8 mg total) by mouth every 8 (eight) hours as needed for nausea. 07/13/21   Terrilyn Saver, NP  triamcinolone cream (KENALOG) 0.5 %  Apply 1 Application topically 2 (two) times daily. To affected areas. 08/16/21   Terrilyn Saver, NP  Vitamin D, Ergocalciferol, (DRISDOL) 1.25 MG (50000 UNIT) CAPS capsule Take 1 capsule (50,000 Units total) by mouth every 7 (seven) days. Take for 8 total doses(weeks) 07/17/21   Terrilyn Saver, NP  famotidine (PEPCID) 20 MG tablet Take 1 tablet (20 mg total) by mouth 2 (two) times daily. Patient not taking: Reported on 05/17/2020 01/04/20 05/17/20  Antony Blackbird, MD      Allergies    Sulfa drugs cross reactors and Latex    Review of Systems   Review of Systems Negative except as per HPI Physical Exam Updated Vital Signs BP 139/77 (BP Location: Right Arm)   Pulse 63   Temp 98.8 F (37.1 C)   Resp 18   Ht $R'5\' 6"'aX$  (1.676 m)   Wt 108 kg   LMP 08/05/2021   SpO2 99%   BMI 38.41 kg/m  Physical Exam Vitals and nursing note reviewed.  Constitutional:      General: She is not in acute distress.    Appearance: She is well-developed. She is not diaphoretic.  HENT:     Head: Normocephalic and atraumatic.     Right Ear: Ear canal normal. A middle ear effusion is present. Tympanic membrane is not perforated or erythematous.     Left Ear: Ear canal normal. A middle ear effusion is  present. Tympanic membrane is not perforated or erythematous.     Nose: Congestion present.     Mouth/Throat:     Mouth: Mucous membranes are moist.     Pharynx: No oropharyngeal exudate or posterior oropharyngeal erythema.  Eyes:     Conjunctiva/sclera: Conjunctivae normal.  Pulmonary:     Effort: Pulmonary effort is normal.  Musculoskeletal:     Cervical back: Neck supple.  Lymphadenopathy:     Cervical: No cervical adenopathy.  Skin:    General: Skin is warm and dry.     Findings: No erythema or rash.  Neurological:     Mental Status: She is alert and oriented to person, place, and time.  Psychiatric:        Behavior: Behavior normal.     ED Results / Procedures / Treatments   Labs (all labs ordered are  listed, but only abnormal results are displayed) Labs Reviewed  SARS CORONAVIRUS 2 BY RT PCR    EKG None  Radiology No results found.  Procedures Procedures    Medications Ordered in ED Medications - No data to display  ED Course/ Medical Decision Making/ A&P                           Medical Decision Making  41 year old female with complaint of URI symptoms x5 days.  Here today because when she sneezed she had change in hearing out of her right ear which is improving at this point.  Appears to have eustachian tube dysfunction, nasal congestion.  Regarding her eye drainage, her conjunctiva are normal, there is no drainage at this time although the skin around her eyes is sensitive to the touch.  She has a left preauricular lymph node which her PCP is managing, possible abscess, does not appear infected at this time does not require antibiotics.  COVID-negative.  Recommend symptom relief with Flonase, Zyrtec and natural tears which may help with her eye symptoms as well.  Given Tessalon for cough.  Recommend follow-up with PCP if symptoms persist.        Final Clinical Impression(s) / ED Diagnoses Final diagnoses:  Viral upper respiratory tract infection  Dysfunction of both eustachian tubes    Rx / DC Orders ED Discharge Orders          Ordered    benzonatate (TESSALON) 200 MG capsule  Every 8 hours        08/24/21 1555              Tacy Learn, PA-C 08/24/21 [place], [place], DO 08/24/21 1652

## 2021-08-24 NOTE — ED Notes (Signed)
Discharge instructions reviewed with patient. Patient verbalizes understanding, no further questions at this time. Medications/prescriptions and follow up information provided. No acute distress noted at time of departure.  

## 2021-08-24 NOTE — ED Triage Notes (Signed)
URI symptoms x 5 days.  No known fevers.

## 2021-08-27 ENCOUNTER — Emergency Department (HOSPITAL_BASED_OUTPATIENT_CLINIC_OR_DEPARTMENT_OTHER): Payer: Commercial Managed Care - HMO

## 2021-08-27 ENCOUNTER — Encounter (HOSPITAL_BASED_OUTPATIENT_CLINIC_OR_DEPARTMENT_OTHER): Payer: Self-pay | Admitting: Emergency Medicine

## 2021-08-27 ENCOUNTER — Other Ambulatory Visit: Payer: Self-pay

## 2021-08-27 ENCOUNTER — Emergency Department (HOSPITAL_BASED_OUTPATIENT_CLINIC_OR_DEPARTMENT_OTHER)
Admission: EM | Admit: 2021-08-27 | Discharge: 2021-08-27 | Disposition: A | Discharge status: Home / Self Care | Payer: Commercial Managed Care - HMO | Attending: Emergency Medicine | Admitting: Emergency Medicine

## 2021-08-27 DIAGNOSIS — R059 Cough, unspecified: Secondary | ICD-10-CM | POA: Diagnosis present

## 2021-08-27 DIAGNOSIS — J069 Acute upper respiratory infection, unspecified: Secondary | ICD-10-CM | POA: Diagnosis not present

## 2021-08-27 DIAGNOSIS — J019 Acute sinusitis, unspecified: Secondary | ICD-10-CM

## 2021-08-27 DIAGNOSIS — H66003 Acute suppurative otitis media without spontaneous rupture of ear drum, bilateral: Secondary | ICD-10-CM | POA: Insufficient documentation

## 2021-08-27 DIAGNOSIS — Z794 Long term (current) use of insulin: Secondary | ICD-10-CM | POA: Diagnosis not present

## 2021-08-27 DIAGNOSIS — E1140 Type 2 diabetes mellitus with diabetic neuropathy, unspecified: Secondary | ICD-10-CM | POA: Diagnosis not present

## 2021-08-27 DIAGNOSIS — Z91040 Latex allergy status: Secondary | ICD-10-CM | POA: Diagnosis not present

## 2021-08-27 LAB — GROUP A STREP BY PCR: Group A Strep by PCR: NOT DETECTED

## 2021-08-27 MED ORDER — KETOROLAC TROMETHAMINE 60 MG/2ML IM SOLN
60.0000 mg | Freq: Once | INTRAMUSCULAR | Status: AC
  Administered 2021-08-27: 60 mg via INTRAMUSCULAR
  Filled 2021-08-27: qty 2

## 2021-08-27 MED ORDER — FLUTICASONE PROPIONATE 50 MCG/ACT NA SUSP: 2.0000 | Freq: Every day | NASAL | 0 refills | Status: DC

## 2021-08-27 MED ORDER — AMOXICILLIN-POT CLAVULANATE 875-125 MG PO TABS: 1.0000 | ORAL_TABLET | Freq: Two times a day (BID) | ORAL | 0 refills | Status: AC

## 2021-08-27 NOTE — ED Notes (Signed)
Pt transported to radiology.

## 2021-08-27 NOTE — ED Triage Notes (Signed)
Pt arrives pov, steady gait, reports that she "doesn't feel like she is getting enough air". Pt eval. by RT, reports clear. Tx for URI on 6/29.Also reports bilateral ear pain

## 2021-08-27 NOTE — Discharge Instructions (Addendum)
It was a pleasure caring for you today.  I will call you if your strep test is positive--the antibiotic we are prescribing for your ear infection has also been shown to help strep throat.  I hope that you feel better soon!

## 2021-08-27 NOTE — ED Provider Notes (Signed)
[facility] EMERGENCY DEPARTMENT Provider Note   CSN: [id number] Arrival date & time: 08/27/21  1331     History  Chief Complaint  Patient presents with   URI    Colleen Downs is a 41 y.o. female.  HPI     Has been over a week of cough, mucus from nose, eyes, throat discomfort Was at function singing, started coughing while singing and has been sick, feels like ear canal and throat connect-not really throat, but if could reach fingertips into ears it feels like where ears and throat connect Mucus coming out of eyes, nose, mouth Throat feels swollen, ear canals hurting No known fevers, currently homeless and unable to check temperature.  6 months.   Came here Thursday, was sent home with cough medicine (didn't have cough then but do now) When try to eat, drink feels swollen Can't comfortably eat and drink  Feeling like she has a hard time talking and breathing but not shortness of breath Feels awful, under a lot of stress  Using over the counter ear drops, benadryl, tylenol extra strength sinus and the tessalon pearls and feeling no better. Was seen 6/29    Past Medical History:  Diagnosis Date   Diabetes mellitus without complication ([place])    Diabetic neuropathy ([place])    Diarrhea    High cholesterol    Irregular periods     Home Medications Prior to Admission medications   Medication Sig Start Date End Date Taking? Authorizing Provider  amoxicillin-clavulanate (AUGMENTIN) 875-125 MG tablet Take 1 tablet by mouth every 12 (twelve) hours for 10 days. 08/27/21 09/06/21 Yes Stesha Neyens, Junie Panning, MD  fluticasone (FLONASE) 50 MCG/ACT nasal spray Place 2 sprays into both nostrils daily for 3 days. 08/27/21 08/30/21 Yes Gareth Morgan, MD  atorvastatin (LIPITOR) 10 MG tablet Take 1 tablet (10 mg total) by mouth daily. 06/14/21   Terrilyn Saver, NP  benzonatate (TESSALON) 200 MG capsule Take 1 capsule (200 mg total) by mouth every 8 (eight) hours for 10 days. 08/24/21 09/03/21   Suella Broad A, PA-C  blood glucose meter kit and supplies KIT Dispense based on patient and insurance preference. Use up to four times daily as directed. Please include lancets, test strips, control solution. 06/13/21   Terrilyn Saver, NP  Cholecalciferol (VITAMIN D3) 25 MCG (1000 UT) CAPS Take 1 capsule (1,000 Units total) by mouth daily. 08/16/21   Terrilyn Saver, NP  Dulaglutide (TRULICITY) 9.97 FS/1.4EL SOPN Inject 0.75 mg into the skin once a week. 08/23/21   Terrilyn Saver, NP  gabapentin (NEURONTIN) 100 MG capsule One tab PO qHS for a week, then 2 tabs nightly for a week, then if needed go up to 300 mg nightly 07/13/21   Terrilyn Saver, NP  ondansetron (ZOFRAN-ODT) 8 MG disintegrating tablet Take 1 tablet (8 mg total) by mouth every 8 (eight) hours as needed for nausea. 07/13/21   Terrilyn Saver, NP  triamcinolone cream (KENALOG) 0.5 % Apply 1 Application topically 2 (two) times daily. To affected areas. 08/16/21   Terrilyn Saver, NP  Vitamin D, Ergocalciferol, (DRISDOL) 1.25 MG (50000 UNIT) CAPS capsule Take 1 capsule (50,000 Units total) by mouth every 7 (seven) days. Take for 8 total doses(weeks) 07/17/21   Terrilyn Saver, NP  famotidine (PEPCID) 20 MG tablet Take 1 tablet (20 mg total) by mouth 2 (two) times daily. Patient not taking: Reported on 05/17/2020 01/04/20 05/17/20  Antony Blackbird, MD      Allergies  Sulfa drugs cross reactors and Latex    Review of Systems   Review of Systems  Physical Exam Updated Vital Signs BP (!) 152/82   Pulse 76   Temp 99.5 F (37.5 C) (Oral)   Resp 16   Ht $R'5\' 6"'Mx$  (1.676 m)   Wt 104.3 kg   LMP 08/05/2021   SpO2 97%   BMI 37.12 kg/m  Physical Exam Vitals and nursing note reviewed.  Constitutional:      General: She is not in acute distress.    Appearance: She is well-developed. She is not diaphoretic.  HENT:     Head: Normocephalic and atraumatic.     Right Ear: Tympanic membrane is bulging.     Left Ear: Tympanic membrane is bulging.  Eyes:      Conjunctiva/sclera: Conjunctivae normal.  Cardiovascular:     Rate and Rhythm: Normal rate and regular rhythm.     Heart sounds: Normal heart sounds. No murmur heard.    No friction rub. No gallop.  Pulmonary:     Effort: Pulmonary effort is normal. No respiratory distress.     Breath sounds: Normal breath sounds. No wheezing or rales.  Abdominal:     General: There is no distension.     Palpations: Abdomen is soft.     Tenderness: There is no abdominal tenderness. There is no guarding.  Musculoskeletal:        General: No tenderness.     Cervical back: Normal range of motion.  Skin:    General: Skin is warm and dry.     Findings: No erythema or rash.  Neurological:     Mental Status: She is alert and oriented to person, place, and time.     ED Results / Procedures / Treatments   Labs (all labs ordered are listed, but only abnormal results are displayed) Labs Reviewed  GROUP A STREP BY PCR    EKG None  Radiology No results found.  Procedures Procedures    Medications Ordered in ED Medications  ketorolac (TORADOL) injection 60 mg (60 mg Intramuscular Given 08/27/21 1630)    ED Course/ Medical Decision Making/ A&P                           Medical Decision Making Amount and/or Complexity of Data Reviewed Radiology: ordered.  Risk Prescription drug management.   41yo female with history of DM presents with nasal congestion, throat discomfort, fatigue, cough.   Denies dyspnea on my history, feels like it is more congestion/upper airway. No stridor, no wheezing, no focal findings on lung exam to suggest pneumonia.  No sign of RPA, PTA, epiglottitis.  Strep testing done and negative. COVID done on 6/29 negative.  Signs of otitis media on exam.  Suspect viral URI that is now bacterial otitis media after one week of symptoms.  Given rx for flonase and augmentin.  Recommend this and continued supportive care.  Also recommend PCP follow up, reeval of thyroid, consider  [place] given concern for enlargement on exam. Patient discharged in stable condition with understanding of reasons to return. .           Final Clinical Impression(s) / ED Diagnoses Final diagnoses:  Non-recurrent acute suppurative otitis media of both ears without spontaneous rupture of tympanic membranes  Acute sinusitis, recurrence not specified, unspecified location  Upper respiratory tract infection, unspecified type    Rx / DC Orders ED Discharge Orders  Ordered    fluticasone (FLONASE) 50 MCG/ACT nasal spray  Daily        08/27/21 1615    amoxicillin-clavulanate (AUGMENTIN) 875-125 MG tablet  Every 12 hours        08/27/21 1615              Gareth Morgan, MD 08/28/21 1703

## 2021-09-13 ENCOUNTER — Ambulatory Visit (INDEPENDENT_AMBULATORY_CARE_PROVIDER_SITE_OTHER): Payer: Commercial Managed Care - HMO | Admitting: Family Medicine

## 2021-09-13 ENCOUNTER — Encounter: Payer: Self-pay | Admitting: Neurology

## 2021-09-13 ENCOUNTER — Encounter: Payer: Self-pay | Admitting: Family Medicine

## 2021-09-13 VITALS — BP 124/75 | HR 72 | Ht 66.0 in | Wt 238.8 lb

## 2021-09-13 DIAGNOSIS — E0789 Other specified disorders of thyroid: Secondary | ICD-10-CM

## 2021-09-13 DIAGNOSIS — E1169 Type 2 diabetes mellitus with other specified complication: Secondary | ICD-10-CM

## 2021-09-13 DIAGNOSIS — E785 Hyperlipidemia, unspecified: Secondary | ICD-10-CM

## 2021-09-13 DIAGNOSIS — Z6841 Body Mass Index (BMI) 40.0 and over, adult: Secondary | ICD-10-CM

## 2021-09-13 DIAGNOSIS — G629 Polyneuropathy, unspecified: Secondary | ICD-10-CM | POA: Diagnosis not present

## 2021-09-13 DIAGNOSIS — E559 Vitamin D deficiency, unspecified: Secondary | ICD-10-CM | POA: Diagnosis not present

## 2021-09-13 DIAGNOSIS — G4719 Other hypersomnia: Secondary | ICD-10-CM

## 2021-09-13 DIAGNOSIS — B379 Candidiasis, unspecified: Secondary | ICD-10-CM

## 2021-09-13 DIAGNOSIS — F331 Major depressive disorder, recurrent, moderate: Secondary | ICD-10-CM

## 2021-09-13 DIAGNOSIS — E1140 Type 2 diabetes mellitus with diabetic neuropathy, unspecified: Secondary | ICD-10-CM

## 2021-09-13 DIAGNOSIS — E66813 Obesity, class 3: Secondary | ICD-10-CM

## 2021-09-13 MED ORDER — ESCITALOPRAM OXALATE 10 MG PO TABS: 10.0000 mg | ORAL_TABLET | Freq: Every day | ORAL | 3 refills | Status: DC

## 2021-09-13 MED ORDER — FLUCONAZOLE 150 MG PO TABS: 150.0000 mg | ORAL_TABLET | Freq: Once | ORAL | 0 refills | Status: AC

## 2021-09-13 NOTE — Assessment & Plan Note (Signed)
Not controlled with last A1c 8.2% Continue current medications - Trulicity 3.24 mg weekly,  UTD on vaccines, eye exam, foot exam On Statin  Discussed diet and exercise F/u in 3-4 months if labs stable

## 2021-09-13 NOTE — Assessment & Plan Note (Signed)
Starting Lexapro - handout attached.  Taking the medicine as directed and not missing any doses is one of the best things you can do to treat your anxiety/depression.  Here are some things to keep in mind: - Side effects (stomach upset, some increased anxiety) may happen before you notice a benefit.  These side effects typically go away over time. - Changes to your dose of medicine or a change in medication all together is sometimes necessary - Many people will notice an improvement within two weeks but the full effect of the medication can take up to 4-6 weeks - Stopping the medication when you start feeling better often results in a return of symptoms. Most people need to be on medication at least 6-12 months If you start having thoughts of hurting yourself or others after starting this medicine, please call me immediately.

## 2021-09-13 NOTE — Assessment & Plan Note (Signed)
-  Reviewed most recent lipid panel -Medication management: continue atorvastatin 10 mg daily -Repeat CMP and lipid panel today -Diet low in saturated fat -Regular exercise - at least 30 minutes, 5 times per week  

## 2021-09-13 NOTE — Assessment & Plan Note (Signed)
Continue with daily supplementation  Rechecking labs today

## 2021-09-13 NOTE — Assessment & Plan Note (Signed)
Updating labs. Continue Trulicity, healthy diet (DASH/MIND), portion control, regular exercise.

## 2021-09-13 NOTE — Patient Instructions (Addendum)
Referral sent to [facility], [place], [place] [address] Phone: [phone]  [facility] for cyst removal: Sent to [facility] [address] [place], [place], [place] [address] Phone: [phone]   [facility] and [facility] - Urgent care services - Outpatient services: Individual Therapy Partial Hospitalization Program (PHP) Substance Abuse Intensive Outpatient Program [facility]) Specialized Intensive Adult Group Therapy Medication Management Peer Living Room Phone: [phone] Address: [place], [place] [address] Hours: Open 24/7, No appointment required.    Starting Lexapro - handout attached.  Taking the medicine as directed and not missing any doses is one of the best things you can do to treat your anxiety/depression.  Here are some things to keep in mind: Side effects (stomach upset, some increased anxiety) may happen before you notice a benefit.  These side effects typically go away over time. Changes to your dose of medicine or a change in medication all together is sometimes necessary Many people will notice an improvement within two weeks but the full effect of the medication can take up to 4-6 weeks Stopping the medication when you start feeling better often results in a return of symptoms. Most people need to be on medication at least 6-12 months If you start having thoughts of hurting yourself or others after starting this medicine, please call me immediately.     For your stye: - Start doing warm compresses a few times per day - If not improving, have the eye doctor take a look to see if they need to drain it.

## 2021-09-13 NOTE — Progress Notes (Signed)
Established Patient Office Visit  Subjective   [name], female    DOB: 01-29-1981  Age: 41 y.o. MRN: [id number]  CC: 31-monthfollow-up, chronic disease management   HPI   DIABETES: - Checking BG at home: not regularly, last time was close to 100 - Medications: Trulicity 03.41mg weekly - Compliance: good - Diet: low carb, decreased appetite, vegetarian - Exercise: minimal - Eye exam: referral placed last visit  - Foot exam: UTD - Microalbumin: UTD - Denies symptoms of hypoglycemia, polyuria, polydipsia, foot ulcers/trauma, wounds that are not healing, medication side effects  - She is having worsening neuropathic pain in bilateral upper and lower extremities  Lab Results  Component Value Date   HGBA1C 8.2 (H) 06/13/2021      HYPERLIPIDEMIA - medications: atorvastatin 10 mg daily - compliance: good - medication SEs: none The 10-year ASCVD risk score (Arnett DK, et al., 2019) is: 2.1%   Values used to calculate the score:     Age: [age]years     Sex: Female     Is Non-Hispanic African American: Yes     Diabetic: Yes     Tobacco smoker: No     Systolic Blood Pressure: 1962mmHg     Is BP treated: No     HDL Cholesterol: 44.7 mg/dL     Total Cholesterol: 230 mg/dL   Vitamin D Deficiency: - Oral supplementation 1,000 units daily (initially did 8 weeks of high dose)   She does feel like she has a vaginal yeast infection since she has been on antibiotics recently for URI/AOM.   Depression: - Patient previously declined medication until she could get set up with counseling. Referral was placed in April and local resources provided.  - Brought mom to visit today to hold herself accountable to be truthful.  - Past few weeks have been really bad and she has had thoughts of not wanting to be here anymore. She has been stressed with recent illness, living situation, work, eSocial research officer, government Symptoms tend to be severely worse with PMS, crying a lot, thoughts of wanting to  be dead, but typically after her periods start, her symptoms improve. She hasn't had any specific thoughts/plans, just general thought of wishing she wasn't here anymore. She denies any active SI/HI. - She is now willing to take medication. She felt like Zoloft (cannot remember dose) made her feel like a zombie in the past, so would prefer to try something different, low-dose.    Daytime sleepiness: - she has been feeling more tired lately; needs naps throughout the day; doesn't feel rested when she wakes up; restless sleeper at night STOP-BANG for SLEEP APNEA Do you Snore loudly? unsure Do you often feel Tired during day? Yes Has anyone Observed you stop breathing? Yes History of high blood Pressure? No BMI >35? Yes Age >50? No Neck circumference >16 in? Yes Gender female? No 5-8 = high risk 3-4 = intermediate 0-2 = low risk    Right lower eye lid stye: Started around the time of recent URI a week or two ago, but keeps getting bigger. Area is red, tender.         ROS All review of systems negative except what is listed in the HPI    Objective:     BP 124/75   Pulse 72   Ht '5\' 6"'$  (1.676 m)   Wt 238 lb 12.8 oz (108.3 kg)   LMP 08/05/2021   BMI 38.54 kg/m  Physical Exam Vitals reviewed.  Constitutional:      General: She is not in acute distress.    Appearance: Normal appearance. She is obese. She is not ill-appearing.  HENT:     Head:     Comments: Right lower eyelid with hordeolum, no surrounding edema, erythema, drainage noted Neck:     Comments: Mild thyroid/anterior neck fullness Cardiovascular:     Rate and Rhythm: Normal rate and regular rhythm.  Pulmonary:     Effort: Pulmonary effort is normal.     Breath sounds: Normal breath sounds.  Musculoskeletal:        General: Normal range of motion.     Cervical back: Normal range of motion and neck supple.     Right lower leg: No edema.     Left lower leg: No edema.  Skin:    General: Skin is warm and  dry.  Neurological:     General: No focal deficit present.     Mental Status: She is alert and oriented to person, place, and time. Mental status is at baseline.  Psychiatric:        Mood and Affect: Mood normal.        Behavior: Behavior normal.        Thought Content: Thought content normal.        Judgment: Judgment normal.      No results found for any visits on 09/13/21.    The 10-year ASCVD risk score (Arnett DK, et al., 2019) is: 2.1%    Assessment & Plan:   Problem List Items Addressed This Visit       Endocrine   Type 2 diabetes mellitus with morbid obesity ([place])    Not controlled with last A1c 8.2% Continue current medications - Trulicity 6.57 mg weekly,  UTD on vaccines, eye exam, foot exam On Statin  Discussed diet and exercise F/u in 3-4 months if labs stable       Relevant Orders   Hemoglobin A1c   CBC   Comprehensive metabolic panel   Lipid panel   Hyperlipidemia associated with type 2 diabetes mellitus ([place]) - Primary    -Reviewed most recent lipid panel -Medication management: continue atorvastatin 10 mg daily -Repeat CMP and lipid panel today -Diet low in saturated fat -Regular exercise - at least 30 minutes, 5 times per week       Relevant Orders   Hemoglobin A1c   CBC   Comprehensive metabolic panel   Lipid panel     Other   Class 3 severe obesity due to excess calories with serious comorbidity and body mass index (BMI) of 40.0 to 44.9 in adult [facility])    Updating labs. Continue Trulicity, healthy diet (DASH/MIND), portion control, regular exercise.       Vitamin D deficiency    Continue with daily supplementation  Rechecking labs today       Relevant Orders   VITAMIN D 25 Hydroxy (Vit-D Deficiency, Fractures)   Major depression    Starting Lexapro - handout attached.  Taking the medicine as directed and not missing any doses is one of the best things you can do to treat your anxiety/depression.  Here are some things to keep in  mind: Side effects (stomach upset, some increased anxiety) may happen before you notice a benefit.  These side effects typically go away over time. Changes to your dose of medicine or a change in medication all together is sometimes necessary Many people will notice an improvement within  two weeks but the full effect of the medication can take up to 4-6 weeks Stopping the medication when you start feeling better often results in a return of symptoms. Most people need to be on medication at least 6-12 months If you start having thoughts of hurting yourself or others after starting this medicine, please call me immediately.        Relevant Medications   escitalopram (LEXAPRO) 10 MG tablet   Other Relevant Orders   Ambulatory referral to [facility]   Other Visit Diagnoses     Neuropathy     Only getting minimal relief with gabapentin and she is nervous to increase dose. She would like to proceed with neuro referral for further management. Aware that it is likely due to her diabetes. Patient aware of signs/symptoms requiring further/urgent evaluation.    Relevant Orders   Ambulatory referral to Neurology   Yeast infection        Relevant Medications   fluconazole (DIFLUCAN) 150 MG tablet   Excessive daytime sleepiness     Symptoms and presentation concerning for OSA. Referral to pulmonology for potential sleep study.    Relevant Orders   Ambulatory referral to Pulmonology   Thyroid fullness    Mild thyroid fullness noted on exam. Patient has not noticed any major changes, but states that when she was recently in ED, physician also noted mild thyroid fullness. Last TSH normal, rechecking today. [place] ordered.     Relevant Orders   TSH   US THYROID       Return in about 6 weeks (around 10/25/2021) for mood f/u .     I spent 40 minutes dedicated to the care of this patient on the date of this encounter to include pre-visit chart review of prior notes and results, face-to-face time  with the patient performing a medically appropriate exam, counseling/education regarding chronic conditions and depression, and post-visit documentation and ordering of labs, imaging, and referrals as indicated.    Terrilyn Saver, NP

## 2021-09-14 LAB — LIPID PANEL
Cholesterol: 181 mg/dL (ref 0–200)
HDL: 46 mg/dL (ref >39.00)
LDL Cholesterol: 102 mg/dL — ABNORMAL HIGH (ref 0–99)
NonHDL: 134.8
Total CHOL/HDL Ratio: 4
Triglycerides: 164 mg/dL — ABNORMAL HIGH (ref 0.0–149.0)
VLDL: 32.8 mg/dL (ref 0.0–40.0)

## 2021-09-14 LAB — COMPREHENSIVE METABOLIC PANEL
ALT: 17 U/L (ref 0–35)
AST: 16 U/L (ref 0–37)
Albumin: 4.2 g/dL (ref 3.5–5.2)
Alkaline Phosphatase: 39 U/L (ref 39–117)
BUN: 8 mg/dL (ref 6–23)
CO2: 25 mEq/L (ref 19–32)
Calcium: 9.5 mg/dL (ref 8.4–10.5)
Chloride: 102 mEq/L (ref 96–112)
Creatinine, Ser: 0.77 mg/dL (ref 0.40–1.20)
GFR: 96.24 mL/min (ref >60.00)
Glucose, Bld: 112 mg/dL — ABNORMAL HIGH (ref 70–99)
Potassium: 4.1 mEq/L (ref 3.5–5.1)
Sodium: 137 mEq/L (ref 135–145)
Total Bilirubin: 0.4 mg/dL (ref 0.2–1.2)
Total Protein: 7.3 g/dL (ref 6.0–8.3)

## 2021-09-14 LAB — CBC
HCT: 37.3 % (ref 36.0–46.0)
Hemoglobin: 12.4 g/dL (ref 12.0–15.0)
MCHC: 33.2 g/dL (ref 30.0–36.0)
MCV: 94 fl (ref 78.0–100.0)
Platelets: 363 10*3/uL (ref 150.0–400.0)
RBC: 3.97 Mil/uL (ref 3.87–5.11)
RDW: 13.9 % (ref 11.5–15.5)
WBC: 9.4 10*3/uL (ref 4.0–10.5)

## 2021-09-14 LAB — TSH: TSH: 1.11 u[IU]/mL (ref 0.35–5.50)

## 2021-09-14 LAB — VITAMIN D 25 HYDROXY (VIT D DEFICIENCY, FRACTURES): VITD: 23.79 ng/mL — ABNORMAL LOW (ref 30.00–100.00)

## 2021-09-14 LAB — HEMOGLOBIN A1C: Hgb A1c MFr Bld: 7.7 % — ABNORMAL HIGH (ref 4.6–6.5)

## 2021-09-15 MED ORDER — VITAMIN D3 25 MCG (1000 UT) PO CAPS: 2000.0000 [IU] | ORAL_CAPSULE | Freq: Every day | ORAL | 3 refills | Status: DC

## 2021-09-15 MED ORDER — TRULICITY 1.5 MG/0.5ML ~~LOC~~ SOAJ: 1.5000 mg | SUBCUTANEOUS | 2 refills | Status: DC

## 2021-09-15 NOTE — Addendum Note (Signed)
Addended by: Caleen Jobs B on: 09/15/2021 09:55 AM   Modules accepted: Orders

## 2021-09-15 NOTE — Progress Notes (Signed)
-   Your A1c is starting to come down! Still not quite at goal, so I think we should increase to the next dose of Trulicity (1.5 mg)  if you feel comfortable with that. Make sure you have completed at least 4 weeks of the 0.75 mg dose before starting the new dose. - Overall cholesterol is looking better,but triglycerides are still elevated. Make sure you are taking an Omega-3 supplement daily and working on healthy diet choices and exercise.  - Vitamin D is still low, but starting to improve. Increase your daily supplement to 2,000 units. I will send this in for you.

## 2021-09-18 ENCOUNTER — Ambulatory Visit (HOSPITAL_BASED_OUTPATIENT_CLINIC_OR_DEPARTMENT_OTHER)
Admission: RE | Admit: 2021-09-18 | Discharge: 2021-09-18 | Disposition: A | Discharge status: Home / Self Care | Payer: Commercial Managed Care - HMO | Source: Ambulatory Visit | Attending: Family Medicine | Admitting: Family Medicine

## 2021-09-18 ENCOUNTER — Telehealth: Payer: Self-pay | Admitting: Family Medicine

## 2021-09-18 DIAGNOSIS — H00012 Hordeolum externum right lower eyelid: Secondary | ICD-10-CM

## 2021-09-18 DIAGNOSIS — E0789 Other specified disorders of thyroid: Secondary | ICD-10-CM | POA: Diagnosis not present

## 2021-09-18 NOTE — Telephone Encounter (Signed)
Pt called stating that her eye is still giving her issues and that Lovena Le told her if if the warm compress didn't help that she would prescribe her a topical cream to put on it.

## 2021-09-19 ENCOUNTER — Encounter (HOSPITAL_BASED_OUTPATIENT_CLINIC_OR_DEPARTMENT_OTHER): Payer: Self-pay | Admitting: Emergency Medicine

## 2021-09-19 ENCOUNTER — Other Ambulatory Visit: Payer: Self-pay

## 2021-09-19 ENCOUNTER — Emergency Department (HOSPITAL_BASED_OUTPATIENT_CLINIC_OR_DEPARTMENT_OTHER)
Admission: EM | Admit: 2021-09-19 | Discharge: 2021-09-19 | Disposition: A | Discharge status: Home / Self Care | Payer: Commercial Managed Care - HMO | Attending: Emergency Medicine | Admitting: Emergency Medicine

## 2021-09-19 DIAGNOSIS — H00012 Hordeolum externum right lower eyelid: Secondary | ICD-10-CM

## 2021-09-19 DIAGNOSIS — R0981 Nasal congestion: Secondary | ICD-10-CM | POA: Diagnosis present

## 2021-09-19 DIAGNOSIS — Z91040 Latex allergy status: Secondary | ICD-10-CM | POA: Diagnosis not present

## 2021-09-19 DIAGNOSIS — J069 Acute upper respiratory infection, unspecified: Secondary | ICD-10-CM

## 2021-09-19 MED ORDER — ERYTHROMYCIN 5 MG/GM OP OINT: 1.0000 | TOPICAL_OINTMENT | Freq: Three times a day (TID) | OPHTHALMIC | 0 refills | Status: AC

## 2021-09-19 NOTE — Telephone Encounter (Signed)
Pt in ED right now for eye pain.

## 2021-09-19 NOTE — ED Triage Notes (Signed)
Pt c/o BL ear pain "where her ear canal connects to her throat" Recently tx for double ear infection. Right sided facial swelling this morning - has Stye in right eye. Third time being seen for same issues - no improvement with abx.

## 2021-09-19 NOTE — Discharge Instructions (Signed)
I recommend that you use Flonase and Mucinex for your congestion.  Follow-up with your primary care doctor if your symptoms are not improving.  Return to emergency room if you have any worsening symptoms.

## 2021-09-19 NOTE — ED Provider Notes (Signed)
[facility] EMERGENCY DEPARTMENT Provider Note   CSN: [id number] Arrival date & time: 09/19/21  1439     History  Chief Complaint  Patient presents with   Otalgia   Stye    Colleen Downs is a 41 y.o. female.  Patient is a 41 year old female who presents with nasal congestion and ear pain.  She says she has had sinus congestion since June 29 or may be a couple days before.  She was seen in the ED on June 29 and diagnosed with a viral URI.  She was given a prescription for Flonase.  However she said that the pharmacy was not able to fill that because her insurance does not cover it.  They got her a prescription for another medication and it sounds like maybe it was something like Afrin because she was told only to use it for 3 days.  She is not currently using anything for her congestion.  She came back on July 2 to the ED and had bilateral ear pain.  She was diagnosed with bilateral otitis media and started on Augmentin.  She said that she has had ongoing congestion.  She has pain behind her ears.  No fevers.  No significant chest congestion.  She did have some swelling under her right eye to her right eyelid that is been going on for the last several days.  She said it was a knot and last night it burst.  She says she actually squeezed it and squeezed some pus out.  On one of her last visits, she was told that her thyroid was enlarged.  She followed up with her primary care doctor and said she recently had a thyroid ultrasound but does not have the results yet.       Home Medications Prior to Admission medications   Medication Sig Start Date End Date Taking? Authorizing Provider  atorvastatin (LIPITOR) 10 MG tablet Take 1 tablet (10 mg total) by mouth daily. 06/14/21   Terrilyn Saver, NP  blood glucose meter kit and supplies KIT Dispense based on patient and insurance preference. Use up to four times daily as directed. Please include lancets, test strips, control solution. 06/13/21    Terrilyn Saver, NP  Cholecalciferol (VITAMIN D3) 25 MCG (1000 UT) CAPS Take 2 capsules (2,000 Units total) by mouth daily. 09/15/21   Terrilyn Saver, NP  Dulaglutide (TRULICITY) 1.5 QA/8.3MH SOPN Inject 1.5 mg into the skin once a week. 09/15/21   Terrilyn Saver, NP  erythromycin ophthalmic ointment Place 1 Application into the right eye 3 (three) times daily for 5 days. 09/19/21 09/24/21  Terrilyn Saver, NP  escitalopram (LEXAPRO) 10 MG tablet Take 1 tablet (10 mg total) by mouth daily. 09/13/21   Terrilyn Saver, NP  fluticasone (FLONASE) 50 MCG/ACT nasal spray Place 2 sprays into both nostrils daily for 3 days. 08/27/21 08/30/21  Gareth Morgan, MD  gabapentin (NEURONTIN) 100 MG capsule One tab PO qHS for a week, then 2 tabs nightly for a week, then if needed go up to 300 mg nightly 07/13/21   Terrilyn Saver, NP  ondansetron (ZOFRAN-ODT) 8 MG disintegrating tablet Take 1 tablet (8 mg total) by mouth every 8 (eight) hours as needed for nausea. 07/13/21   Terrilyn Saver, NP  triamcinolone cream (KENALOG) 0.5 % Apply 1 Application topically 2 (two) times daily. To affected areas. 08/16/21   Terrilyn Saver, NP  Vitamin D, Ergocalciferol, (DRISDOL) 1.25 MG (50000 UNIT) CAPS  capsule Take 1 capsule (50,000 Units total) by mouth every 7 (seven) days. Take for 8 total doses(weeks) 07/17/21   Terrilyn Saver, NP  famotidine (PEPCID) 20 MG tablet Take 1 tablet (20 mg total) by mouth 2 (two) times daily. Patient not taking: Reported on 05/17/2020 01/04/20 05/17/20  Antony Blackbird, MD      Allergies    Sulfa drugs cross reactors and Latex    Review of Systems   Review of Systems  Constitutional:  Positive for fatigue. Negative for fever.  HENT:  Positive for congestion, postnasal drip, rhinorrhea and sore throat (Scratchy throat). Negative for trouble swallowing and voice change.   Respiratory:  Negative for cough and shortness of breath.   Gastrointestinal:  Negative for nausea and vomiting.  Skin:  Negative for rash.   All other systems reviewed and are negative.   Physical Exam Updated Vital Signs BP (!) 127/95 (BP Location: Right Arm)   Pulse 68   Temp 99.1 F (37.3 C) (Oral)   Resp 18   LMP 09/02/2021 (Exact Date)   SpO2 100%  Physical Exam Constitutional:      Appearance: She is well-developed.  HENT:     Head: Normocephalic and atraumatic.     Right Ear: Tympanic membrane normal.     Left Ear: Tympanic membrane normal.     Ears:     Comments: Some clear fluid behind the TMs but no suggestions of infection    Nose: No congestion or rhinorrhea.     Mouth/Throat:     Mouth: Mucous membranes are moist.     Pharynx: No oropharyngeal exudate or posterior oropharyngeal erythema.     Comments: No erythema or exudates, uvula is midline, no trismus Eyes:     Pupils: Pupils are equal, round, and reactive to light.     Comments: Small stye to the right lower eyelid, mild swelling to the edge of the lid but no signs of septal or preseptal cellulitis  Neck:     Comments: No meningismus Cardiovascular:     Rate and Rhythm: Normal rate and regular rhythm.     Heart sounds: Normal heart sounds.  Pulmonary:     Effort: Pulmonary effort is normal. No respiratory distress.     Breath sounds: Normal breath sounds. No wheezing or rales.  Chest:     Chest wall: No tenderness.  Abdominal:     General: Bowel sounds are normal.     Palpations: Abdomen is soft.     Tenderness: There is no abdominal tenderness. There is no guarding or rebound.  Musculoskeletal:        General: Normal range of motion.     Cervical back: Normal range of motion and neck supple.  Lymphadenopathy:     Cervical: No cervical adenopathy.  Skin:    General: Skin is warm and dry.     Findings: No rash.  Neurological:     Mental Status: She is alert and oriented to person, place, and time.     ED Results / Procedures / Treatments   Labs (all labs ordered are listed, but only abnormal results are displayed) Labs Reviewed  - No data to display  EKG None  Radiology US THYROID  Result Date: 09/18/2021 CLINICAL DATA:  Thyroid fullness EXAM: THYROID ULTRASOUND TECHNIQUE: Ultrasound examination of the thyroid gland and adjacent soft tissues was performed. COMPARISON:  None available FINDINGS: Parenchymal Echotexture: Mildly heterogenous Isthmus: 1.0 cm Right lobe: 6.2 x 2.5 x 2.1 cm Left lobe:  6.2 x 1.7 x 2.4 cm _________________________________________________________ Estimated total number of nodules >/= 1 cm: 0 Number of spongiform nodules >/=  2 cm not described below (TR1): 0 Number of mixed cystic and solid nodules >/= 1.5 cm not described below ([place]): 0 _________________________________________________________ No discrete nodules are seen within the thyroid gland. IMPRESSION: Mild heterogeneity of the thyroid parenchyma without discrete nodule. The above is in keeping with the ACR TI-RADS recommendations - J Am Coll Radiol 2017;14:587-595. Electronically Signed   By: Miachel Roux M.D.   On: 09/18/2021 12:35    Procedures Procedures    Medications Ordered in ED Medications - No data to display  ED Course/ Medical Decision Making/ A&P                           Medical Decision Making  Patient is a 41 year old female who presents with URI symptoms.  This been going on for about a month.  She was treated for otitis media with Augmentin.  I do not appreciate any suggestions of bacterial infection.  She is afebrile.  She does not have significant tenderness over her sinuses.  Her ears do not look infected.  Her throat exam is benign.  She is recently been tested for both strep and COVID which were negative.  She is otherwise well-appearing.  There is no clinical suggestions of pneumonia.  I discussed with her symptomatic care instructions including Flonase and Mucinex.  I was getting give her a prescription for erythromycin ointment for her stye but I did notice that her primary care physician sent a prescription in  today for that.  It appears localized and actually she reports it is better than it was yesterday because it opened up.  Advised her to continue doing warm compresses.  Return precautions were given.  She was frustrated and upset that she is seeing multiple different people for this condition.  She does not want to answer questions and is frustrated that she has to answer the same questions over and over again.  I attempted to have a conversation about the importance of each provider getting their own information.  I did advise her that in the emergency department she likely will see different providers with each visit.  She does have a primary care provider and I suggested that if she wanted more continuity, it may be better to have these visits done with a primary care provider.  She does not appear to be happy with the advice has been given to her today.  I did stressed that if she has any worsening symptoms to come back and we will be happy to reassess her.  Final Clinical Impression(s) / ED Diagnoses Final diagnoses:  Viral upper respiratory tract infection  Hordeolum externum of right lower eyelid    Rx / DC Orders ED Discharge Orders     None         Malvin Johns, MD 09/19/21 1725

## 2021-09-26 ENCOUNTER — Ambulatory Visit: Payer: Commercial Managed Care - HMO | Admitting: Family Medicine

## 2021-10-02 ENCOUNTER — Ambulatory Visit: Payer: Commercial Managed Care - HMO | Admitting: Family

## 2021-10-03 ENCOUNTER — Ambulatory Visit: Payer: Commercial Managed Care - HMO | Admitting: Family

## 2021-10-12 ENCOUNTER — Ambulatory Visit (INDEPENDENT_AMBULATORY_CARE_PROVIDER_SITE_OTHER): Payer: Commercial Managed Care - HMO | Admitting: Adult Health

## 2021-10-12 ENCOUNTER — Encounter: Payer: Self-pay | Admitting: Adult Health

## 2021-10-12 DIAGNOSIS — R0683 Snoring: Secondary | ICD-10-CM

## 2021-10-12 DIAGNOSIS — Z6841 Body Mass Index (BMI) 40.0 and over, adult: Secondary | ICD-10-CM | POA: Diagnosis not present

## 2021-10-12 NOTE — Patient Instructions (Signed)
Set up for home sleep study  Work on healthy sleep regimen  Do not drive if sleepy  Work on healthy weight loss.  Follow up in 2 months to discuss test results and treatment plan.

## 2021-10-12 NOTE — Assessment & Plan Note (Addendum)
Snoring , daytime sleepiness, restless sleep, BMI 38  and gasping for air during sleep concerning for OSA  Patient education on sleep apnea and testing - discussed how weight can impact sleep and risk for sleep disordered breathing - discussed options to assist with weight loss: combination of diet modification, cardiovascular and strength training exercises   - had an extensive discussion regarding the adverse health consequences related to untreated sleep disordered breathing - specifically discussed the risks for hypertension, coronary artery disease, cardiac dysrhythmias, cerebrovascular disease, and diabetes - lifestyle modification discussed   - discussed how sleep disruption can increase risk of accidents, particularly when driving - safe driving practices were discussed   Set up for home sleep study   Plan  Patient Instructions  Set up for home sleep study  Work on healthy sleep regimen  Do not drive if sleepy  Work on healthy weight loss.  Follow up in 2 months to discuss test results and treatment plan.

## 2021-10-12 NOTE — Assessment & Plan Note (Signed)
Healthy weight loss 

## 2021-10-12 NOTE — Progress Notes (Signed)
$'@Patient'e$  ID: Colleen Downs, female    DOB: 27-Jul-1980, 41 y.o.   MRN: [id number]  Chief Complaint  Patient presents with   Consult    Referring provider: Terrilyn Saver, NP  HPI: 41 year old female seen for sleep consult October 12, 2021 for snoring, restless sleep, daytime sleepiness and gasping for air during sleep  TEST/EVENTS :   10/12/2021 Sleep consult  Patient presents for a sleep consult today.  She was kindly referred by her primary care provider Caleen Jobs , NP .  Patient complains of loud snoring, restless sleep, daytime sleepiness and gasping for air during her sleep.  Patient says her sleep schedule varies from day-to-day.  Typically takes an hour or 2 to go to sleep.  Is up multiple times throughout the night.  Her weight has been down about 10 to 20 pounds over the last 2 years.  Current weight is at 237 pounds with a BMI at 38.  Patient has never had a sleep study before.  Epworth score is 10 out of 24.  Patient gets sleepy when she sits down to watch TV and in the afternoon hours.. Feels sleepy all the time. Wakes up feeling unrefreshed.  Has DM and DM neuropathy . She is on Neurontin.  Takes Advil /tylenol PM to help with sleep. Smokes Mariajuana daily to help relax .  No history of congestive heart failure or stroke. No symptoms suspicious for cataplexy or sleep paralysis .  Under a lot of stress, having to live with family member. Not sleeping well due to this. Feels she is not getting any good sleep.  Going to be moving soon.  No removable dental work.   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 Smokes marajuana daily , Social etoh. No kids.   SH: Cholecystectomy    FH : RA , DM,   Allergies  Allergen Reactions   Sulfa Drugs Cross Reactors Anaphylaxis   Latex     Immunization History  Administered Date(s) Administered   Tdap 12/24/2016    Past Medical History:  Diagnosis Date   Diabetes mellitus  without complication (HCC)    Diabetic neuropathy ([place])    Diarrhea    High cholesterol    Irregular periods     Tobacco History: Social History   Tobacco Use  Smoking Status Former   Types: Cigarettes  Smokeless Tobacco Never  Tobacco Comments   Has periods that she smokes for a little while and then stops for a while.  10/12/2021 hfb   Counseling given: Not Answered Tobacco comments: Has periods that she smokes for a little while and then stops for a while.  10/12/2021 hfb   Outpatient Medications Prior to Visit  Medication Sig Dispense Refill   atorvastatin (LIPITOR) 10 MG tablet Take 1 tablet (10 mg total) by mouth daily. 90 tablet 3   blood glucose meter kit and supplies KIT Dispense based on patient and insurance preference. Use up to four times daily as directed. Please include lancets, test strips, control solution. 1 each 0   Cholecalciferol (VITAMIN D3) 25 MCG (1000 UT) CAPS Take 2 capsules (2,000 Units total) by mouth daily. 60 capsule 3   Dulaglutide (TRULICITY) 1.5 WU/9.8JX SOPN Inject 1.5 mg into the skin once a week. 2 mL 2   gabapentin (NEURONTIN) 100 MG capsule One tab PO qHS for a week, then 2 tabs nightly for a week, then if needed go up to  300 mg nightly 90 capsule 11   triamcinolone cream (KENALOG) 0.5 % Apply 1 Application topically 2 (two) times daily. To affected areas. 30 g 3   escitalopram (LEXAPRO) 10 MG tablet Take 1 tablet (10 mg total) by mouth daily. (Patient not taking: Reported on 10/12/2021) 30 tablet 3   fluticasone (FLONASE) 50 MCG/ACT nasal spray Place 2 sprays into both nostrils daily for 3 days. 1 g 0   ondansetron (ZOFRAN-ODT) 8 MG disintegrating tablet Take 1 tablet (8 mg total) by mouth every 8 (eight) hours as needed for nausea. (Patient not taking: Reported on 10/12/2021) 20 tablet 3   Vitamin D, Ergocalciferol, (DRISDOL) 1.25 MG (50000 UNIT) CAPS capsule Take 1 capsule (50,000 Units total) by mouth every 7 (seven) days. Take for 8 total  doses(weeks) (Patient not taking: Reported on 10/12/2021) 4 capsule 0   No facility-administered medications prior to visit.     Review of Systems:   Constitutional:   No  weight loss, night sweats,  Fevers, chills,  +fatigue, or  lassitude.  HEENT:   No headaches,  Difficulty swallowing,  Tooth/dental problems, or  Sore throat,                No sneezing, itching, ear ache, nasal congestion, post nasal drip,   CV:  No chest pain,  Orthopnea, PND, swelling in lower extremities, anasarca, dizziness, palpitations, syncope.   GI  No heartburn, indigestion, abdominal pain, nausea, vomiting, diarrhea, change in bowel habits, loss of appetite, bloody stools.   Resp: No shortness of breath with exertion or at rest.  No excess mucus, no productive cough,  No non-productive cough,  No coughing up of blood.  No change in color of mucus.  No wheezing.  No chest wall deformity  Skin: no rash or lesions.  GU: no dysuria, change in color of urine, no urgency or frequency.  No flank pain, no hematuria   MS:  No joint pain or swelling.  No decreased range of motion.  No back pain.    Physical Exam  BP 106/62 (BP Location: Left Arm, Patient Position: Sitting, Cuff Size: Large)   Pulse 72   Temp 98.1 F (36.7 C) (Oral)   Ht $R'5\' 6"'MO$  (1.676 m)   Wt 237 lb (107.5 kg)   LMP 09/02/2021 (Exact Date)   SpO2 98%   BMI 38.25 kg/m   GEN: A/Ox3; pleasant , NAD, well nourished    HEENT:  [place]/AT,  EACs-clear, TMs-wnl, NOSE-clear, THROAT-clear, no lesions, no postnasal drip or exudate noted. Class 3 MP airway   NECK:  Supple w/ fair ROM; no JVD; normal carotid impulses w/o bruits; no thyromegaly or nodules palpated; no lymphadenopathy.    RESP  Clear  P & A; w/o, wheezes/ rales/ or rhonchi. no accessory muscle use, no dullness to percussion  CARD:  RRR, no m/r/g, no peripheral edema, pulses intact, no cyanosis or clubbing.  GI:   Soft & nt; nml bowel sounds; no organomegaly or masses detected.    Musco: Warm bil, no deformities or joint swelling noted.   Neuro: alert, no focal deficits noted.    Skin: Warm, no lesions or rashes    Lab Results:  CBC    Component Value Date/Time   WBC 9.4 09/13/2021 1402   RBC 3.97 09/13/2021 1402   HGB 12.4 09/13/2021 1402   HGB 14.3 05/17/2020 1705   HCT 37.3 09/13/2021 1402   HCT 40.9 05/17/2020 1705   PLT 363.0 09/13/2021 1402   PLT  412 05/17/2020 1705   MCV 94.0 09/13/2021 1402   MCV 90 05/17/2020 1705   MCH 31.9 06/01/2021 1710   MCHC 33.2 09/13/2021 1402   RDW 13.9 09/13/2021 1402   RDW 13.5 05/17/2020 1705   LYMPHSABS 2.9 06/01/2021 1710   LYMPHSABS 3.8 (H) 05/17/2020 1705   MONOABS 0.4 06/01/2021 1710   EOSABS 0.1 06/01/2021 1710   EOSABS 0.1 05/17/2020 1705   BASOSABS 0.0 06/01/2021 1710   BASOSABS 0.0 05/17/2020 1705    BMET    Component Value Date/Time   NA 137 09/13/2021 1402   NA 139 05/17/2020 1705   K 4.1 09/13/2021 1402   CL 102 09/13/2021 1402   CO2 25 09/13/2021 1402   GLUCOSE 112 (H) 09/13/2021 1402   BUN 8 09/13/2021 1402   BUN 10 05/17/2020 1705   CREATININE 0.77 09/13/2021 1402   CALCIUM 9.5 09/13/2021 1402   GFRNONAA >60 06/01/2021 1710   GFRAA 128 01/04/2020 1631    BNP No results found for: "BNP"  ProBNP No results found for: "PROBNP"  Imaging:       No data to display          No results found for: "NITRICOXIDE"      Assessment & Plan:   Snoring Snoring , daytime sleepiness, restless sleep, BMI 38  and gasping for air during sleep concerning for OSA  Patient education on sleep apnea and testing - discussed how weight can impact sleep and risk for sleep disordered breathing - discussed options to assist with weight loss: combination of diet modification, cardiovascular and strength training exercises   - had an extensive discussion regarding the adverse health consequences related to untreated sleep disordered breathing - specifically discussed the risks for  hypertension, coronary artery disease, cardiac dysrhythmias, cerebrovascular disease, and diabetes - lifestyle modification discussed   - discussed how sleep disruption can increase risk of accidents, particularly when driving - safe driving practices were discussed   Set up for home sleep study   Plan  Patient Instructions  Set up for home sleep study  Work on healthy sleep regimen  Do not drive if sleepy  Work on healthy weight loss.  Follow up in 2 months to discuss test results and treatment plan.       Class 3 severe obesity due to excess calories with serious comorbidity and body mass index (BMI) of 40.0 to 44.9 in adult [facility]) Healthy weight loss      Rexene Edison, NP 10/12/2021

## 2021-10-12 NOTE — Addendum Note (Signed)
Addended by: Vanessa Barbara on: 10/12/2021 05:03 PM   Modules accepted: Orders

## 2021-10-21 NOTE — Progress Notes (Signed)
Reviewed and agree with assessment/plan.   Chesley Mires, MD [facility] Pulmonary/Critical Care 10/21/2021, 2:23 PM Pager:  [phone]

## 2021-10-21 NOTE — Progress Notes (Signed)
Reviewed and agree with assessment/plan.   Chesley Mires, MD [facility] Pulmonary/Critical Care 10/21/2021, 2:23 PM Pager:  [phone]

## 2021-10-21 NOTE — Progress Notes (Signed)
Reviewed and agree with assessment/plan.   Chesley Mires, MD [facility] Pulmonary/Critical Care 10/21/2021, 2:23 PM Pager:  [phone]

## 2021-10-25 ENCOUNTER — Ambulatory Visit (INDEPENDENT_AMBULATORY_CARE_PROVIDER_SITE_OTHER): Payer: Commercial Managed Care - HMO | Admitting: Family

## 2021-10-25 ENCOUNTER — Telehealth: Payer: Self-pay | Admitting: Family Medicine

## 2021-10-25 VITALS — BP 120/69 | HR 64 | Temp 98.5°F | Resp 18 | Ht 66.0 in | Wt 236.8 lb

## 2021-10-25 DIAGNOSIS — E559 Vitamin D deficiency, unspecified: Secondary | ICD-10-CM

## 2021-10-25 DIAGNOSIS — R0683 Snoring: Secondary | ICD-10-CM

## 2021-10-25 DIAGNOSIS — E1169 Type 2 diabetes mellitus with other specified complication: Secondary | ICD-10-CM | POA: Diagnosis not present

## 2021-10-25 DIAGNOSIS — L723 Sebaceous cyst: Secondary | ICD-10-CM | POA: Insufficient documentation

## 2021-10-25 DIAGNOSIS — F331 Major depressive disorder, recurrent, moderate: Secondary | ICD-10-CM

## 2021-10-25 MED ORDER — VITAMIN D3 25 MCG (1000 UT) PO CAPS: 2000.0000 [IU] | ORAL_CAPSULE | Freq: Every day | ORAL | 3 refills | Status: DC

## 2021-10-25 NOTE — Assessment & Plan Note (Signed)
She is going to hold off on starting lexapro until she is settled at her new job.

## 2021-10-25 NOTE — Patient Instructions (Signed)
I have referred you to a surgeon in [phone] I have also referred you to [facility] [phone]

## 2021-10-25 NOTE — Assessment & Plan Note (Signed)
She had trouble getting the vitamin D from her pharmacy. I printed her an rx to bring to them.

## 2021-10-25 NOTE — Assessment & Plan Note (Signed)
Did not hear back from local surgery group about scheduling.  She would like to see someone near [place]. Referral placed.

## 2021-10-25 NOTE — Assessment & Plan Note (Signed)
She is overdue for eye exam.  Did not hear back previously about scheduling, replaced referral, pt given number to call.  She states she is preferring the Trulicity to the Ozempic.   Lab Results  Component Value Date   HGBA1C 7.7 (H) 09/13/2021

## 2021-10-25 NOTE — Telephone Encounter (Signed)
[facility] states they do not accept pt's ins.

## 2021-10-25 NOTE — Assessment & Plan Note (Signed)
She has chronic fatigue during the day, loud snoring, broken sleep.  She is working on getting a home sleep study arranged.  I suspect that she has OSA and that this is a major contributor to her overall low energy.

## 2021-10-25 NOTE — Progress Notes (Signed)
Subjective:     [name], female    DOB: 09-05-1980, 41 y.o.   MRN: [id number]  Chief Complaint  Patient presents with   6 week follow up on Mood    Started Lexapro at last OV. Pt has not started the RX yet due to her move to [place].  Concerns/ questions: Pt is having issues obtaining her vitamin D from [facility].     HPI Patient is in today for follow up of herdepression.  She did not start lexapro.  She will be moving to [place].  She was afraid that it might make her more sleepy. She plans to continue her care here in [place] after her move and will begin lexapro when things settle down.  Reports overall mood is stable.   Vitamin D- had trouble getting the 1000 iu dose at her pharmacy.   DM2- states she is really liking Trulicity.   Health Maintenance Due  Topic Date Due   OPHTHALMOLOGY EXAM  Never done   FOOT EXAM  09/26/2021    Past Medical History:  Diagnosis Date   Diabetes mellitus without complication (HCC)    Diabetic neuropathy (HCC)    Diarrhea    High cholesterol    Irregular periods     Past Surgical History:  Procedure Laterality Date   CHOLECYSTECTOMY      Family History  Problem Relation Age of Onset   Rheum arthritis Mother    Rheum arthritis Sister    Arthritis Maternal Uncle    Arthritis Paternal Uncle    Diabetes Maternal Grandmother     Social History   Socioeconomic History   Marital status: Single    Spouse name: Not on file   Number of children: Not on file   Years of education: Not on file   Highest education level: Not on file  Occupational History   Not on file  Tobacco Use   Smoking status: Former    Types: Cigarettes   Smokeless tobacco: Never   Tobacco comments:    Has periods that she smokes for a little while and then stops for a while.  10/12/2021 hfb  Vaping Use   Vaping Use: Never used  Substance and Sexual Activity   Alcohol use: Yes    Comment: social   Drug use: Yes    Types:  Marijuana   Sexual activity: Not Currently    Birth control/protection: Condom  Other Topics Concern   Not on file  Social History Narrative   Not on file   Social Determinants of Health   Financial Resource Strain: Not on file  Food Insecurity: Not on file  Transportation Needs: Not on file  Physical Activity: Not on file  Stress: Not on file  Social Connections: Not on file  Intimate Partner Violence: Not on file    Outpatient Medications Prior to Visit  Medication Sig Dispense Refill   atorvastatin (LIPITOR) 10 MG tablet Take 1 tablet (10 mg total) by mouth daily. 90 tablet 3   blood glucose meter kit and supplies KIT Dispense based on patient and insurance preference. Use up to four times daily as directed. Please include lancets, test strips, control solution. 1 each 0   Dulaglutide (TRULICITY) 1.5 JF/3.5KT SOPN Inject 1.5 mg into the skin once a week. 2 mL 2   gabapentin (NEURONTIN) 100 MG capsule One tab PO qHS for a week, then 2 tabs nightly for a week, then if needed go up to 300 mg  nightly 90 capsule 11   ondansetron (ZOFRAN-ODT) 8 MG disintegrating tablet Take 1 tablet (8 mg total) by mouth every 8 (eight) hours as needed for nausea. 20 tablet 3   triamcinolone cream (KENALOG) 0.5 % Apply 1 Application topically 2 (two) times daily. To affected areas. 30 g 3   Cholecalciferol (VITAMIN D3) 25 MCG (1000 UT) CAPS Take 2 capsules (2,000 Units total) by mouth daily. 60 capsule 3   escitalopram (LEXAPRO) 10 MG tablet Take 1 tablet (10 mg total) by mouth daily. (Patient not taking: Reported on 10/25/2021) 30 tablet 3   fluticasone (FLONASE) 50 MCG/ACT nasal spray Place 2 sprays into both nostrils daily for 3 days. 1 g 0   No facility-administered medications prior to visit.    Allergies  Allergen Reactions   Sulfa Drugs Cross Reactors Anaphylaxis   Latex     ROS See HPI    Objective:    Physical Exam Constitutional:      General: She is not in acute distress.     Appearance: Normal appearance. She is well-developed.  HENT:     Head: Normocephalic and atraumatic.     Right Ear: External ear normal.     Left Ear: External ear normal.  Eyes:     General: No scleral icterus. Neck:     Thyroid: No thyromegaly.  Cardiovascular:     Rate and Rhythm: Normal rate and regular rhythm.     Heart sounds: Normal heart sounds. No murmur heard. Pulmonary:     Effort: Pulmonary effort is normal. No respiratory distress.     Breath sounds: Normal breath sounds. No wheezing.  Musculoskeletal:     Cervical back: Neck supple.  Skin:    General: Skin is warm and dry.     Comments: Sebaceous cyst left pre-auricular area, left upper back and mons pubis  Neurological:     Mental Status: She is alert and oriented to person, place, and time.  Psychiatric:        Mood and Affect: Mood normal.        Behavior: Behavior normal.        Thought Content: Thought content normal.        Judgment: Judgment normal.     BP 120/69 (BP Location: Right Arm, Patient Position: Sitting, Cuff Size: Large)   Pulse 64   Temp 98.5 F (36.9 C) (Oral)   Resp 18   Ht 5' 6" (1.676 m)   Wt 236 lb 12.8 oz (107.4 kg)   SpO2 99%   BMI 38.22 kg/m  Wt Readings from Last 3 Encounters:  10/25/21 236 lb 12.8 oz (107.4 kg)  10/12/21 237 lb (107.5 kg)  09/13/21 238 lb 12.8 oz (108.3 kg)       Assessment & Plan:   Problem List Items Addressed This Visit       Unprioritized   Vitamin D deficiency    She had trouble getting the vitamin D from her pharmacy. I printed her an rx to bring to them.       Relevant Medications   Cholecalciferol (VITAMIN D3) 25 MCG (1000 UT) CAPS   Type 2 diabetes mellitus with morbid obesity ([place])    She is overdue for eye exam.  Did not hear back previously about scheduling, replaced referral, pt given number to call.  She states she is preferring the Trulicity to the Ozempic.   Lab Results  Component Value Date   HGBA1C 7.7 (H) 09/13/2021  Relevant Orders   Ambulatory referral to Ophthalmology   Snoring    She has chronic fatigue during the day, loud snoring, broken sleep.  She is working on getting a home sleep study arranged.  I suspect that she has OSA and that this is a major contributor to her overall low energy.       Sebaceous cyst - Primary    Did not hear back from local surgery group about scheduling.  She would like to see someone near [place]. Referral placed.       Relevant Orders   Ambulatory referral to General Surgery   Major depression    She is going to hold off on starting lexapro until she is settled at her new job.        I am having [name] maintain her blood glucose meter kit and supplies, atorvastatin, ondansetron, gabapentin, triamcinolone cream, fluticasone, escitalopram, Trulicity, and Vitamin D3.  Meds ordered this encounter  Medications   Cholecalciferol (VITAMIN D3) 25 MCG (1000 UT) CAPS    Sig: Take 2 capsules (2,000 Units total) by mouth daily.    Dispense:  60 capsule    Refill:  3    Order Specific Question:   Supervising Provider    Answer:   Penni Homans A [[id number]]

## 2021-10-26 ENCOUNTER — Telehealth: Payer: Self-pay | Admitting: Family

## 2021-10-26 LAB — HM DIABETES EYE EXAM

## 2021-10-26 NOTE — Telephone Encounter (Signed)
See mychart.  

## 2021-10-27 ENCOUNTER — Encounter: Payer: Self-pay | Admitting: *Deleted

## 2021-11-21 NOTE — Progress Notes (Deleted)
Initial neurology clinic note  SERVICE DATE: 11/24/21  Reason for Evaluation: Consultation requested by Clayborne Dana, NP for an opinion regarding neuropathy. My final recommendations will be communicated back to the requesting physician by way of shared medical record or letter to requesting physician via [place] mail.  HPI: This is [name], a 41 y.o. ***-handed female with a medical history of DM, HLD, obesity, vit D deficiency, depression*** who presents to neurology clinic with the chief complaint of ***. The patient is accompanied by ***.  ***  She is currently on gabapentin 100 mg qhs with minimal relief. She was cautious to increase, so patient was referred to neurology first for further evaluation.  She is currently on Lexapro for depression.  The patient has not*** had similar episodes of symptoms in the past. ***  Muscle bulk loss? *** Muscle pain? ***  Cramps/Twitching? *** Suggestion of myotonia/difficulty relaxing after contraction? ***  Fatigable weakness?*** Does strength improve after brief exercise?***  Able to brush hair/teeth without difficulty? *** Able to button shirts/use zips? *** Clumsiness/dropping grasped objects?*** Can you arise from squatted position easily? *** Able to get out of chair without using arms? *** Able to walk up steps easily? *** Use an assistive device to walk? *** Significant imbalance with walking? *** Falls?*** Any change in urine color, especially after exertion/physical activity? ***  The patient denies*** symptoms suggestive of oculobulbar weakness including diplopia, ptosis, dysphagia, poor saliva control, dysarthria/dysphonia, impaired mastication, facial weakness/droop.  There are no*** neuromuscular respiratory weakness symptoms, particularly orthopnea>dyspnea.   Pseudobulbar affect is absent***.  The patient does not*** report symptoms referable to autonomic dysfunction including impaired sweating, heat or cold  intolerance, excessive mucosal dryness, gastroparetic early satiety, postprandial abdominal bloating, constipation, bowel or bladder dyscontrol, erectile dysfunction*** or syncope/presyncope/orthostatic intolerance.  There are no*** complaints relating to other symptoms of small fiber modalities including paresthesia/pain.  The patient has not *** noticed any recent skin rashes nor does he*** report any constitutional symptoms like fever, night sweats, anorexia or unintentional weight loss.  EtOH use: ***  Restrictive diet? *** Family history of neuropathy/myopathy/NM disease?***  Previous labs, electrodiagnostics, and neuroimaging are summarized below, but pertinent findings include***  Any biopsy done? *** Current medications being tried for the patient's symptoms include ***  Prior medications that have been tried: ***   MEDICATIONS:  Outpatient Encounter Medications as of 11/24/2021  Medication Sig   atorvastatin (LIPITOR) 10 MG tablet Take 1 tablet (10 mg total) by mouth daily.   blood glucose meter kit and supplies KIT Dispense based on patient and insurance preference. Use up to four times daily as directed. Please include lancets, test strips, control solution.   Cholecalciferol (VITAMIN D3) 25 MCG (1000 UT) CAPS Take 2 capsules (2,000 Units total) by mouth daily.   Dulaglutide (TRULICITY) 1.5 MG/0.5ML SOPN Inject 1.5 mg into the skin once a week.   escitalopram (LEXAPRO) 10 MG tablet Take 1 tablet (10 mg total) by mouth daily. (Patient not taking: Reported on 10/25/2021)   fluticasone (FLONASE) 50 MCG/ACT nasal spray Place 2 sprays into both nostrils daily for 3 days.   gabapentin (NEURONTIN) 100 MG capsule One tab PO qHS for a week, then 2 tabs nightly for a week, then if needed go up to 300 mg nightly   ondansetron (ZOFRAN-ODT) 8 MG disintegrating tablet Take 1 tablet (8 mg total) by mouth every 8 (eight) hours as needed for nausea.   triamcinolone cream (KENALOG) 0.5 % Apply 1  Application  topically 2 (two) times daily. To affected areas.   [DISCONTINUED] famotidine (PEPCID) 20 MG tablet Take 1 tablet (20 mg total) by mouth 2 (two) times daily. (Patient not taking: Reported on 05/17/2020)   No facility-administered encounter medications on file as of 11/24/2021.    PAST MEDICAL HISTORY: Past Medical History:  Diagnosis Date   Diabetes mellitus without complication (HCC)    Diabetic neuropathy (HCC)    Diarrhea    High cholesterol    Irregular periods     PAST SURGICAL HISTORY: Past Surgical History:  Procedure Laterality Date   CHOLECYSTECTOMY      ALLERGIES: Allergies  Allergen Reactions   Sulfa Drugs Cross Reactors Anaphylaxis   Latex     FAMILY HISTORY: Family History  Problem Relation Age of Onset   Rheum arthritis Mother    Rheum arthritis Sister    Arthritis Maternal Uncle    Arthritis Paternal Uncle    Diabetes Maternal Grandmother     SOCIAL HISTORY: Social History   Tobacco Use   Smoking status: Former    Types: Cigarettes   Smokeless tobacco: Never   Tobacco comments:    Has periods that she smokes for a little while and then stops for a while.  10/12/2021 hfb  Vaping Use   Vaping Use: Never used  Substance Use Topics   Alcohol use: Yes    Comment: social   Drug use: Yes    Types: Marijuana   Social History   Social History Narrative   Not on file     OBJECTIVE: PHYSICAL EXAM: There were no vitals taken for this visit.  General:*** General appearance: Awake and alert. No distress. Cooperative with exam.  Skin: No obvious rash or jaundice. HEENT: Atraumatic. Anicteric. Lungs: Non-labored breathing on room air  Heart: Regular Abdomen: Soft, non tender. Extremities: No edema. No obvious deformity.  Musculoskeletal: No obvious joint swelling. Psych: Affect appropriate.  Neurological: Mental Status: Alert. Speech fluent. No pseudobulbar affect Cranial Nerves: CNII: No RAPD. Visual fields grossly  intact. CNIII, IV, VI: PERRL. No nystagmus. EOMI. CN V: Facial sensation intact bilaterally to fine touch. [place] clench strong. Jaw jerk***. CN VII: Facial muscles symmetric and strong. No ptosis at rest or after sustained upgaze***. CN VIII: Hearing grossly intact bilaterally. CN IX: No hypophonia. CN X: Palate elevates symmetrically. CN XI: Full strength shoulder shrug bilaterally. CN XII: Tongue protrusion full and midline. No atrophy or fasciculations. No significant dysarthria*** Motor: Tone is ***. *** fasciculations in *** extremities. *** atrophy. No grip or percussive myotonia.***  Individual muscle group testing (MRC grade out of 5):  Movement     Neck flexion ***    Neck extension ***     Right Left   Shoulder abduction *** ***   Shoulder adduction *** ***   Shoulder ext rotation *** ***   Shoulder int rotation *** ***   Elbow flexion *** ***   Elbow extension *** ***   Wrist extension *** ***   Wrist flexion *** ***   Finger abduction - FDI *** ***   Finger abduction - ADM *** ***   Finger extension *** ***   Finger distal flexion - 2/3 *** ***   Finger distal flexion - 4/5 *** ***   Thumb flexion - FPL *** ***   Thumb abduction - APB *** ***    Hip flexion *** ***   Hip extension *** ***   Hip adduction *** ***   Hip abduction *** ***  Knee extension *** ***   Knee flexion *** ***   Dorsiflexion *** ***   Plantarflexion *** ***   Inversion *** ***   Eversion *** ***   Great toe extension *** ***   Great toe flexion *** ***     Reflexes:  Right Left   Bicep *** ***   Tricep *** ***   BrRad *** ***   Knee *** ***   Ankle *** ***    Pathological Reflexes: Babinski: *** response bilaterally*** Hoffman: *** Troemner: *** Pectoral: *** Palmomental: *** Facial: *** Midline tap: *** Sensation: Pinprick: *** Vibration: *** Temperature: *** Proprioception: *** Coordination: Intact finger-to- nose-finger bilaterally. Romberg  negative.*** Gait: Able to rise from chair with arms crossed unassisted. Normal, narrow-based gait. Able to tandem walk. Able to walk on toes and heels.***  Lab and Test Review: Internal labs: Normal or unremarkable: TSH, CMP, CBC, HIV, RPR Vit D: 23.79 (low) HbA1c (09/13/21): 7.7 ***  External labs: ***  ***  ASSESSMENT: Colleen Downs is a 41 y.o. female who presents for evaluation of ***. *** has a relevant medical history of ***. *** neurological examination is pertinent for ***. Available diagnostic data is significant for ***. This constellation of symptoms and objective data would most likely localize to ***. ***  PLAN: -Blood work: *** ***  -Return to clinic ***  The impression above as well as the plan as outlined below were extensively discussed with the patient (in the company of ***) who voiced understanding. All questions were answered to their satisfaction.  The patient was counseled on pertinent fall precautions per the printed material provided today, and as noted under the "Patient Instructions" section below.***  When available, results of the above investigations and possible further recommendations will be communicated to the patient via telephone/MyChart. Patient to call office if not contacted after expected testing turnaround time.   Total time spent reviewing records, interview, history/exam, documentation, and coordination of care on day of encounter:  *** min   Thank you for allowing me to participate in patient's care.  If I can answer any additional questions, I would be pleased to do so.  Kai Levins, MD   CC: Terrilyn Saver, NP [place] [address]  CC: Referring provider: Terrilyn Saver, NP [address],  [place] [address]

## 2021-11-24 ENCOUNTER — Ambulatory Visit: Payer: Commercial Managed Care - HMO | Admitting: Neurology

## 2021-11-24 ENCOUNTER — Encounter: Payer: Self-pay | Admitting: Neurology

## 2021-11-29 ENCOUNTER — Telehealth: Payer: Self-pay | Admitting: Family Medicine

## 2021-11-29 NOTE — Telephone Encounter (Signed)
Medication: Dulaglutide (TRULICITY) 1.5 TG/2.8DK SOPN  Has the patient contacted their pharmacy? No.   Preferred Pharmacy:  [facility], [place], [place] [address]

## 2021-11-30 ENCOUNTER — Other Ambulatory Visit: Payer: Self-pay | Admitting: *Deleted

## 2021-11-30 DIAGNOSIS — E1140 Type 2 diabetes mellitus with diabetic neuropathy, unspecified: Secondary | ICD-10-CM

## 2021-11-30 MED ORDER — TRULICITY 1.5 MG/0.5ML ~~LOC~~ SOAJ: 1.5000 mg | SUBCUTANEOUS | 2 refills | Status: DC

## 2021-11-30 NOTE — Telephone Encounter (Signed)
Refill sent to pharmacy.   

## 2021-12-04 ENCOUNTER — Encounter: Payer: Commercial Managed Care - HMO | Admitting: Obstetrics & Gynecology

## 2021-12-21 ENCOUNTER — Encounter: Payer: Self-pay | Admitting: Neurology

## 2022-01-19 ENCOUNTER — Other Ambulatory Visit: Payer: Self-pay | Admitting: Family Medicine

## 2022-01-19 DIAGNOSIS — F331 Major depressive disorder, recurrent, moderate: Secondary | ICD-10-CM

## 2022-01-28 NOTE — Progress Notes (Unsigned)
   GYNECOLOGY OFFICE VISIT NOTE  History:   Colleen Downs is a 41 y.o. G0P0000 here today for painful periods.   She had a TSH on 7/19 which was normal. Her Hgb is 12.4 on 7/19. A1C 7.7.   Her last pelvic ultrasound was in 2019 and was overall normal. She had it for heavy bleeding at that time.   Per review of Dr. Jordan Hawks note from 2019, the patient has a history of PCOS. She used OCPs in the past.   Of note from her medical history she has DM with neuropathy.     Past Medical History:  Diagnosis Date   Diabetes mellitus without complication ([place])    Diabetic neuropathy ([place])    Diarrhea    High cholesterol    Irregular periods     Past Surgical History:  Procedure Laterality Date   CHOLECYSTECTOMY      The following portions of the patient's history were reviewed and updated as appropriate: allergies, current medications, past family history, past medical history, past social history, past surgical history and problem list.   Health Maintenance:   Normal pap and negative HRHPV on 09/28/20.   Diagnosis  Date Value Ref Range Status  09/28/2020   Final   - Negative for intraepithelial lesion or malignancy (NILM)    Review of Systems:  Pertinent items noted in HPI and remainder of comprehensive ROS otherwise negative.  Physical Exam:  There were no vitals taken for this visit. CONSTITUTIONAL: Well-developed, well-nourished female in no acute distress.  HEENT:  Normocephalic, atraumatic. External right and left ear normal. No scleral icterus.  NECK: Normal range of motion, supple, no masses noted on observation SKIN: No rash noted. Not diaphoretic. No erythema. No pallor. MUSCULOSKELETAL: Normal range of motion. No edema noted. NEUROLOGIC: Alert and oriented to person, place, and time. Normal muscle tone coordination. No cranial nerve deficit noted. PSYCHIATRIC: Normal mood and affect. Normal behavior. Normal judgment and thought content.  CARDIOVASCULAR: Normal heart  rate noted RESPIRATORY: Effort and breath sounds normal, no problems with respiration noted ABDOMEN: No masses noted. No other overt distention noted.    PELVIC: {Blank single:19197::"Deferred","Normal appearing external genitalia; normal urethral meatus; normal appearing vaginal mucosa and cervix.  No abnormal discharge noted.  Normal uterine size, no other palpable masses, no uterine or adnexal tenderness. Performed in the presence of a chaperone"}  Labs and Imaging No results found for this or any previous visit (from the past 168 hour(s)). No results found.  Assessment and Plan:   1. Dysmenorrhea ***    Diagnoses and all orders for this visit:  Dysmenorrhea    Routine preventative health maintenance measures emphasized. Please refer to After Visit Summary for other counseling recommendations.   No follow-ups on file.  Radene Gunning, MD, [place] for [facility], [place]

## 2022-01-30 ENCOUNTER — Ambulatory Visit: Payer: Commercial Managed Care - HMO | Admitting: Family Medicine

## 2022-01-30 NOTE — Progress Notes (Unsigned)
Initial neurology clinic note  SERVICE DATE: 02/01/22  Reason for Evaluation: Consultation requested by Terrilyn Saver, NP for an opinion regarding pain in bilateral legs and arms. My final recommendations will be communicated back to the requesting physician by way of shared medical record or letter to requesting physician via [place] mail.  HPI: This is [name], a 41 y.o. right-handed female with a medical history of DM, HLD, vit D deficiency, depression who presents to neurology clinic with the chief complaint of numbness and tingling hands and feet. The patient is alone.  Patient has numbness and tingling in hands and feet. Symptoms started about 8 months ago. She thinks it started in both the hands and feet at the same time. The pain is worse at night. The left hand is worse than the right. The right foot hurts more than the left. She mentions a lot of swelling. 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  She has weakness in her left hand, like it is heavy and she can't move her fingers. She denies significant weakness in legs. She denies imbalance. She occasionally has twitching at night.  Patient is currently on gabapentin 300 mg qhs. It helped at first, but lately has not given much relief.   Patient endorses significant depression. She only eats once per day. She is vegetarian.  She denies bowel or bladder difficulties.  She denies any constitutional symptoms like fever, night sweats, anorexia or unintentional weight loss.  EtOH use: Every other weekend will have about 3 drinks  Family history of neuropathy/myopathy/neurologic disease? Grandmother may have neuropathy; father with lumbar radiculopathy   MEDICATIONS:  Outpatient Encounter Medications as of 02/01/2022  Medication Sig   atorvastatin (LIPITOR) 10 MG tablet Take 1 tablet (10 mg total) by mouth daily.   blood glucose meter kit and supplies KIT Dispense based on patient and insurance  preference. Use up to four times daily as directed. Please include lancets, test strips, control solution.   Cholecalciferol (VITAMIN D3) 25 MCG (1000 UT) CAPS Take 2 capsules (2,000 Units total) by mouth daily.   Dulaglutide (TRULICITY) 1.5 QI/2.9NL SOPN Inject 1.5 mg into the skin once a week.   gabapentin (NEURONTIN) 100 MG capsule One tab PO qHS for a week, then 2 tabs nightly for a week, then if needed go up to 300 mg nightly (Patient taking differently: One tab PO qHS for a week, then 2 tabs nightly for a week, then if needed go up to 300 mg nightly- 02-01-22 3 tab a night)   escitalopram (LEXAPRO) 10 MG tablet TAKE 1 TABLET(10 MG) BY MOUTH DAILY (Patient not taking: Reported on 02/01/2022)   fluticasone (FLONASE) 50 MCG/ACT nasal spray Place 2 sprays into both nostrils daily for 3 days.   ondansetron (ZOFRAN-ODT) 8 MG disintegrating tablet Take 1 tablet (8 mg total) by mouth every 8 (eight) hours as needed for nausea. (Patient not taking: Reported on 02/01/2022)   triamcinolone cream (KENALOG) 0.5 % Apply 1 Application topically 2 (two) times daily. To affected areas. (Patient not taking: Reported on 02/01/2022)   [DISCONTINUED] famotidine (PEPCID) 20 MG tablet Take 1 tablet (20 mg total) by mouth 2 (two) times daily. (Patient not taking: Reported on 05/17/2020)   No facility-administered encounter medications on file as of 02/01/2022.    PAST MEDICAL HISTORY: Past Medical History:  Diagnosis Date   Diabetes mellitus without complication ([place])    Diabetic neuropathy ([place] [facility])    Diarrhea  High cholesterol    Irregular periods     PAST SURGICAL HISTORY: Past Surgical History:  Procedure Laterality Date   CHOLECYSTECTOMY      ALLERGIES: Allergies  Allergen Reactions   Sulfa Drugs Cross Reactors Anaphylaxis   Eggs Or Egg-Derived Products    Latex     FAMILY HISTORY: Family History  Problem Relation Age of Onset   Rheum arthritis Mother    Rheum arthritis Sister    Arthritis  Maternal Uncle    Arthritis Paternal Uncle    Diabetes Maternal Grandmother     SOCIAL HISTORY: Social History   Tobacco Use   Smoking status: Former    Types: Cigarettes   Smokeless tobacco: Never   Tobacco comments:    Has periods that she smokes for a little while and then stops for a while.  10/12/2021 hfb  12/7/23cigars one or two a day  Vaping Use   Vaping Use: Never used  Substance Use Topics   Alcohol use: Yes    Comment: social   Drug use: Yes    Types: Marijuana   Social History   Social History Narrative   Are you right handed or left handed? Right   Are you currently employed ?    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 nieces   Who lives with you?    What type of home do you live in: 1 story or 2 story? two    Caffeine rarely 1 or 2 a week     OBJECTIVE: PHYSICAL EXAM: BP (!) 143/92   Pulse 80   Ht _0  (1.676 m)   Wt 235 lb 6.4 oz (106.8 kg)   SpO2 99%   BMI 37.99 kg/m   General: General appearance: Awake and alert. No distress. Cooperative with exam.  Skin: No obvious rash or jaundice. HEENT: Atraumatic. Anicteric. Lungs: Non-labored breathing on room air  Extremities: No obvious edema. No obvious deformity.  Psych: Flat affect.  Neurological: Mental Status: Alert. Speech fluent. No pseudobulbar affect Cranial Nerves: CNII: No RAPD. Visual fields grossly intact. CNIII, IV, VI: PERRL. No nystagmus. EOMI. CN V: Facial sensation intact bilaterally to fine touch. CN VII: Facial muscles symmetric and strong. No ptosis at rest. CN VIII: Hearing grossly intact bilaterally. CN IX: No hypophonia. CN X: Palate elevates symmetrically. CN XI: Full strength shoulder shrug bilaterally. CN XII: Tongue protrusion full and midline. No atrophy or fasciculations. No significant dysarthria Motor: Tone is normal  Individual muscle group testing (MRC grade out of 5):  Movement     Neck flexion 5    Neck extension 5      Right Left   Shoulder abduction 5 5   Elbow flexion 5 5   Elbow extension 5 5   Finger abduction - FDI 5 5   Finger abduction - ADM 5 5   Finger extension 5 5   Finger distal flexion - 2/_1 Finger distal flexion - 4/_2 Thumb flexion - FPL 5 5   Thumb abduction - APB 5- 5-    Hip flexion 5 5   Hip extension 5 5   Hip adduction 5 5   Hip abduction 5 5   Knee extension 5 5   Knee flexion 5 5   Dorsiflexion 5 5   Plantarflexion 5 5   Great toe extension 5 5   Great toe flexion 5 5  Reflexes:  Right Left   Bicep 2+ 2+   Tricep 1+ 1+   BrRad 1+ 1+   Knee 1+ 1+   Ankle 1+ 1+    Pathological Reflexes: Babinski: flexor response bilaterally Hoffman: absent bilaterally Troemner: absent bilaterally Sensation: Pinprick: Diminished in bilateral pinkies but otherwise intact in all extremities Vibration: Intact in bilateral upper extremities. 20 seconds in bilateral great toes Proprioception: Intact in bilateral great toes Coordination: Intact finger-to- nose-finger bilaterally. Romberg negative. Gait: Able to rise from chair with arms crossed unassisted. Normal, narrow-based gait. Able to tandem walk. Able to walk on toes and heels.  Lab and Test Review: Internal labs: 09/13/21: Normal or unremarkable: TSH, CBC, CMP HbA1c: 7.7 Vit D: 23.79  05/27/21: HIV and RPR nonreactive  ASSESSMENT: Colleen Downs is a 41 y.o. female who presents for evaluation of numbness and tingling in bilateral arms and legs, worse in right foot and left arm. She has a relevant medical history of DM, HLD, vit D deficiency, depression. Her neurological examination is pertinent for ?weakness of bilateral APB, hyporeflexia, and mildly diminished sensation in bilateral hands. Available diagnostic data is significant for HbA1c of 7.7, vit D of 23.79. The etiology of patient's symptoms is currently unclear. Her lower limb numbness and tingling could be secondary to diabetic neuropathy, but there  is not clear deficients on exam today. Her upper limb numbness and tingling could be upper limb manifestations of peripheral neuropathy or could be related to carpal tunnel or ulnar neuropathy given that symptoms started around the same time as feet, which would not be expected early in neuropathy. I will look for treatable causes and get an EMG to further evaluate.  PLAN: -Blood work: B1, B12, IFE -EMG: RLE (neuropathy) and LUE (CTS) -Increase gabapentin. Go to 400 mg qhs. If no relief after a week, can continue to increase by 100 mg until at 600 mg qhs. -Continue vit D supplementation 1000 IU  -Patient is currently not on treatment for depression. If starting treatment, may consider agent such as Cymbalta that could treat depression and neuropathic pain  -Return to clinic in 3 months  The impression above as well as the plan as outlined below were extensively discussed with the patient who voiced understanding. All questions were answered to their satisfaction.  When available, results of the above investigations and possible further recommendations will be communicated to the patient via telephone/MyChart. Patient to call office if not contacted after expected testing turnaround time.   Total time spent reviewing records, interview, history/exam, documentation, and coordination of care on day of encounter:  40 min   Thank you for allowing me to participate in patient's care.  If I can answer any additional questions, I would be pleased to do so.  Kai Levins, MD   CC: Terrilyn Saver, NP [place] [phone]  CC: Referring provider: Terrilyn Saver, NP [address] [place],  [place] [address]

## 2022-02-01 ENCOUNTER — Ambulatory Visit: Payer: Commercial Managed Care - HMO | Admitting: Neurology

## 2022-02-01 ENCOUNTER — Ambulatory Visit: Payer: Commercial Managed Care - HMO

## 2022-02-01 ENCOUNTER — Other Ambulatory Visit (HOSPITAL_COMMUNITY)
Admission: RE | Admit: 2022-02-01 | Discharge: 2022-02-01 | Disposition: A | Discharge status: Home / Self Care | Payer: Commercial Managed Care - HMO | Source: Ambulatory Visit | Attending: Obstetrics and Gynecology | Admitting: Obstetrics and Gynecology

## 2022-02-01 ENCOUNTER — Encounter: Payer: Self-pay | Admitting: Neurology

## 2022-02-01 ENCOUNTER — Ambulatory Visit: Payer: Commercial Managed Care - HMO | Admitting: Obstetrics and Gynecology

## 2022-02-01 ENCOUNTER — Encounter: Payer: Self-pay | Admitting: Obstetrics and Gynecology

## 2022-02-01 VITALS — BP 143/92 | HR 80 | Ht 66.0 in | Wt 235.4 lb

## 2022-02-01 VITALS — BP 157/101 | HR 74 | Ht 66.0 in | Wt 235.0 lb

## 2022-02-01 DIAGNOSIS — M79671 Pain in right foot: Secondary | ICD-10-CM | POA: Diagnosis not present

## 2022-02-01 DIAGNOSIS — R202 Paresthesia of skin: Secondary | ICD-10-CM

## 2022-02-01 DIAGNOSIS — E1140 Type 2 diabetes mellitus with diabetic neuropathy, unspecified: Secondary | ICD-10-CM

## 2022-02-01 DIAGNOSIS — N946 Dysmenorrhea, unspecified: Secondary | ICD-10-CM | POA: Diagnosis present

## 2022-02-01 DIAGNOSIS — E1142 Type 2 diabetes mellitus with diabetic polyneuropathy: Secondary | ICD-10-CM

## 2022-02-01 DIAGNOSIS — R200 Anesthesia of skin: Secondary | ICD-10-CM | POA: Diagnosis not present

## 2022-02-01 DIAGNOSIS — N920 Excessive and frequent menstruation with regular cycle: Secondary | ICD-10-CM | POA: Diagnosis not present

## 2022-02-01 DIAGNOSIS — Z1231 Encounter for screening mammogram for malignant neoplasm of breast: Secondary | ICD-10-CM | POA: Diagnosis present

## 2022-02-01 DIAGNOSIS — M79672 Pain in left foot: Secondary | ICD-10-CM

## 2022-02-01 DIAGNOSIS — R0683 Snoring: Secondary | ICD-10-CM | POA: Diagnosis not present

## 2022-02-01 LAB — POCT URINE PREGNANCY: Preg Test, Ur: NEGATIVE

## 2022-02-01 MED ORDER — GABAPENTIN 100 MG PO CAPS: 600.0000 mg | ORAL_CAPSULE | Freq: Every day | ORAL | 5 refills | Status: DC

## 2022-02-01 NOTE — Patient Instructions (Addendum)
Increase gabapentin. Go to 400 mg at night. If no relief after a week, then go to 500 mg at night, if not relief after a week can then increase to 600 mg at night.  Take Vitamin D supplement 1000 international units (IU) daily.  I would like to do an EMG (see below).  Follow up in 3 months.  Please let me know if you have any questions or concerns in the meantime.  The physicians and staff at [facility] are committed to providing excellent care. You may receive a survey requesting feedback about your experience at our office. We strive to receive "very good" responses to the survey questions. If you feel that your experience would prevent you from giving the office a "very good " response, please contact our office to try to remedy the situation. We may be reached at [phone]. Thank you for taking the time out of your busy day to complete the survey.  Kai Levins, MD [facility]  ELECTROMYOGRAM AND NERVE CONDUCTION STUDIES (EMG/NCS) INSTRUCTIONS  How to Prepare The neurologist conducting the EMG will need to know if you have certain medical conditions. Tell the neurologist and other EMG lab personnel if you: Have a pacemaker or any other electrical medical device Take blood-thinning medications Have hemophilia, a blood-clotting disorder that causes prolonged bleeding Bathing Take a shower or bath shortly before your exam in order to remove oils from your skin. Don't apply lotions or creams before the exam.  What to Expect You'll likely be asked to change into a hospital gown for the procedure and lie down on an examination table. The following explanations can help you understand what will happen during the exam.  Electrodes. The neurologist or a technician places surface electrodes at various locations on your skin depending on where you're experiencing symptoms. Or the neurologist may insert needle electrodes at different sites depending on your symptoms.  Sensations.  The electrodes will at times transmit a tiny electrical current that you may feel as a twinge or spasm. The needle electrode may cause discomfort or pain that usually ends shortly after the needle is removed. If you are concerned about discomfort or pain, you may want to talk to the neurologist about taking a short break during the exam.  Instructions. During the needle EMG, the neurologist will assess whether there is any spontaneous electrical activity when the muscle is at rest - activity that isn't present in healthy muscle tissue - and the degree of activity when you slightly contract the muscle.  He or she will give you instructions on resting and contracting a muscle at appropriate times. Depending on what muscles and nerves the neurologist is examining, he or she may ask you to change positions during the exam.  After your EMG You may experience some temporary, minor bruising where the needle electrode was inserted into your muscle. This bruising should fade within several days. If it persists, contact your primary care doctor.

## 2022-02-02 ENCOUNTER — Ambulatory Visit (INDEPENDENT_AMBULATORY_CARE_PROVIDER_SITE_OTHER): Payer: Commercial Managed Care - HMO | Admitting: Family Medicine

## 2022-02-02 VITALS — BP 139/85 | HR 83 | Temp 98.7°F | Resp 18 | Ht 66.0 in | Wt 237.0 lb

## 2022-02-02 DIAGNOSIS — E1169 Type 2 diabetes mellitus with other specified complication: Secondary | ICD-10-CM | POA: Diagnosis not present

## 2022-02-02 DIAGNOSIS — E559 Vitamin D deficiency, unspecified: Secondary | ICD-10-CM

## 2022-02-02 DIAGNOSIS — Z6841 Body Mass Index (BMI) 40.0 and over, adult: Secondary | ICD-10-CM

## 2022-02-02 DIAGNOSIS — E785 Hyperlipidemia, unspecified: Secondary | ICD-10-CM | POA: Diagnosis not present

## 2022-02-02 DIAGNOSIS — F331 Major depressive disorder, recurrent, moderate: Secondary | ICD-10-CM

## 2022-02-02 LAB — CBC WITH DIFFERENTIAL/PLATELET
Basophils Absolute: 0 10*3/uL (ref 0.0–0.1)
Basophils Relative: 0.3 % (ref 0.0–3.0)
Eosinophils Absolute: 0.2 10*3/uL (ref 0.0–0.7)
Eosinophils Relative: 1.7 % (ref 0.0–5.0)
HCT: 37.9 % (ref 36.0–46.0)
Hemoglobin: 12.7 g/dL (ref 12.0–15.0)
Lymphocytes Relative: 31.4 % (ref 12.0–46.0)
Lymphs Abs: 3 10*3/uL (ref 0.7–4.0)
MCHC: 33.5 g/dL (ref 30.0–36.0)
MCV: 92.3 fl (ref 78.0–100.0)
Monocytes Absolute: 0.6 10*3/uL (ref 0.1–1.0)
Monocytes Relative: 6.1 % (ref 3.0–12.0)
Neutro Abs: 5.9 10*3/uL (ref 1.4–7.7)
Neutrophils Relative %: 60.5 % (ref 43.0–77.0)
Platelets: 383 10*3/uL (ref 150.0–400.0)
RBC: 4.11 Mil/uL (ref 3.87–5.11)
RDW: 14.4 % (ref 11.5–15.5)
WBC: 9.7 10*3/uL (ref 4.0–10.5)

## 2022-02-02 LAB — COMPREHENSIVE METABOLIC PANEL
ALT: 15 U/L (ref 0–35)
AST: 12 U/L (ref 0–37)
Albumin: 4.1 g/dL (ref 3.5–5.2)
Alkaline Phosphatase: 38 U/L — ABNORMAL LOW (ref 39–117)
BUN: 9 mg/dL (ref 6–23)
CO2: 29 mEq/L (ref 19–32)
Calcium: 9.8 mg/dL (ref 8.4–10.5)
Chloride: 104 mEq/L (ref 96–112)
Creatinine, Ser: 0.7 mg/dL (ref 0.40–1.20)
GFR: 107.6 mL/min (ref >60.00)
Glucose, Bld: 106 mg/dL — ABNORMAL HIGH (ref 70–99)
Potassium: 4.5 mEq/L (ref 3.5–5.1)
Sodium: 138 mEq/L (ref 135–145)
Total Bilirubin: 0.3 mg/dL (ref 0.2–1.2)
Total Protein: 7.1 g/dL (ref 6.0–8.3)

## 2022-02-02 LAB — LIPID PANEL
Cholesterol: 175 mg/dL (ref 0–200)
HDL: 45 mg/dL (ref >39.00)
LDL Cholesterol: 103 mg/dL — ABNORMAL HIGH (ref 0–99)
NonHDL: 130.16
Total CHOL/HDL Ratio: 4
Triglycerides: 136 mg/dL (ref 0.0–149.0)
VLDL: 27.2 mg/dL (ref 0.0–40.0)

## 2022-02-02 LAB — HEMOGLOBIN A1C: Hgb A1c MFr Bld: 7.5 % — ABNORMAL HIGH (ref 4.6–6.5)

## 2022-02-02 MED ORDER — VITAMIN D3 25 MCG (1000 UT) PO CAPS: 1000.0000 [IU] | ORAL_CAPSULE | Freq: Every day | ORAL | 3 refills | Status: DC

## 2022-02-02 NOTE — Assessment & Plan Note (Signed)
Fairly controlled with last A1c 7.7% Continue current medications - Trulicity 1.5 mg weekly On Statin  Discussed diet and exercise F/u in 3 months

## 2022-02-02 NOTE — Assessment & Plan Note (Signed)
Declined medications today.  New referral for counseling No SI/HI

## 2022-02-02 NOTE — Patient Instructions (Signed)
Sent to [address], [place], [place] [address] Phone: [phone]

## 2022-02-02 NOTE — Assessment & Plan Note (Signed)
-  Reviewed most recent lipid panel -Medication management: continue atorvastatin 10 mg daily -Repeat CMP and lipid panel today -Diet low in saturated fat -Regular exercise - at least 30 minutes, 5 times per week

## 2022-02-02 NOTE — Progress Notes (Unsigned)
Established Patient Office Visit  Subjective   [name], female    DOB: November 03, 1980  Age: 41 y.o. MRN: [id number]  CC: 63-monthfollow-up, chronic disease management   HPI  [place]    DIABETES: - Checking BG at home: no - Medications: Trulicity 1.5 mg weekly - Compliance: good - Diet: low carb, decreased appetite, vegetarian - Exercise: minimal - Eye exam: referral placed last visit  - Foot exam: UTD - Microalbumin: UTD - Denies symptoms of hypoglycemia, polyuria, polydipsia, foot ulcers/trauma, wounds that are not healing, medication side effects  - She is having worsening neuropathic pain in bilateral upper and lower extremities  Lab Results  Component Value Date   HGBA1C 7.7 (H) 09/13/2021      HYPERLIPIDEMIA - medications: atorvastatin 10 mg daily - compliance: good - medication SEs: none The 10-year ASCVD risk score (Arnett DK, et al., 2019) is: 3.2%   Values used to calculate the score:     Age: 89years     Sex: Female     Is Non-Hispanic African American: Yes     Diabetic: Yes     Tobacco smoker: No     Systolic Blood Pressure: 1789mmHg     Is BP treated: No     HDL Cholesterol: 46 mg/dL     Total Cholesterol: 181 mg/dL   Vitamin D Deficiency: - Oral supplementation 1,000 units daily   Neuropathy - She is following with neurology. They are adjusting her meds and checking additional labs today.  - Increased to 400-600 mg gabapentin nightly   Depression: - [place] [facility] [place] [facility] [place] [facility] nie[place], but situation with sister-in-law has been very stressful and she feels manipulated and poorly treated. 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 She is planning to go to housing authority and trying to get some assistance to get back to this area.  - Mood was so bad from all of this last week that she did end up going to the hospital. -  Otherwise she has been doing really good and is feeling like she is making good progress.  - She never started medication.  - No SI/HI ***    09/13/2021    1:48 PM 07/13/2021    1:02 PM 09/28/2020    3:12 PM  PHQ9 SCORE ONLY  PHQ-9 Total Score '16 15 12      '$ 09/13/2021    1:48 PM 07/13/2021    1:02 PM 09/28/2020    3:13 PM 09/26/2020    4:15 PM  GAD 7 : Generalized Anxiety Score  Nervous, Anxious, on Edge '1 3 2 2  '$ Control/stop worrying '2 3 2 2  '$ Worry too much - different things '2 3 2 2  '$ Trouble relaxing '1 1 1 2  '$ Restless 0 0 0 0  Easily annoyed or irritable '2 3 2 2  '$ Afraid - awful might happen '2 2 1 2  '$ Total GAD 7 Score '10 15 10 12  '$ Anxiety Difficulty Somewhat difficult          Sleep apnea concern: - Recently did sleep study. Waiting for results.    States she never got a call from general surgery about getting the cyst removed from her face. We will give her contact information.         ROS All review of systems negative except what is listed in the  HPI    Objective:     BP 139/85   Pulse 83   Temp 98.7 F (37.1 C)   Resp 18   Ht '5\' 6"'$  (1.676 m)   Wt 237 lb (107.5 kg)   LMP 01/23/2022   SpO2 99%   BMI 38.25 kg/m    Physical Exam Vitals reviewed.  Constitutional:      General: She is not in acute distress.    Appearance: Normal appearance. She is obese. She is not ill-appearing.  Cardiovascular:     Rate and Rhythm: Normal rate and regular rhythm.  Pulmonary:     Effort: Pulmonary effort is normal.     Breath sounds: Normal breath sounds.  Musculoskeletal:        General: Normal range of motion.     Cervical back: Normal range of motion and neck supple.     Right lower leg: No edema.     Left lower leg: No edema.  Skin:    General: Skin is warm and dry.  Neurological:     General: No focal deficit present.     Mental Status: She is alert and oriented to person, place, and time. Mental status is at baseline.  Psychiatric:        Mood and  Affect: Mood normal.        Behavior: Behavior normal.        Thought Content: Thought content normal.        Judgment: Judgment normal.      No results found for any visits on 02/02/22.    The 10-year ASCVD risk score (Arnett DK, et al., 2019) is: 3.2%    Assessment & Plan:   Type 2 diabetes mellitus with morbid obesity ([place]) Fairly controlled with last A1c 7.7% Continue current medications - Trulicity 1.5 mg weekly On Statin  Discussed diet and exercise F/u in 3 months   Hyperlipidemia associated with type 2 diabetes mellitus ([place]) -Reviewed most recent lipid panel -Medication management: continue atorvastatin 10 mg daily -Repeat CMP and lipid panel today -Diet low in saturated fat -Regular exercise - at least 30 minutes, 5 times per week   Class 3 severe obesity due to excess calories with serious comorbidity and body mass index (BMI) of 40.0 to 44.9 in adult [facility]) Encouraged healthy weight loss with diet choices, portion control, and exercise.   Vitamin D deficiency Continue supplementation   Major depression Declined medications today.  New referral for counseling No SI/HI   Orders Placed This Encounter  Procedures   Lipid panel   CBC with Differential/Platelet   Comprehensive metabolic panel   Hemoglobin A1c   Ambulatory referral to Behavioral Health    Referral Priority:   Routine    Referral Type:   Psychiatric    Referral Reason:   Specialty Services Required    Requested Specialty:   Behavioral Health    Number of Visits Requested:   1     Return in about 3 months (around 05/04/2022) for routine follow-up.     Terrilyn Saver, NP

## 2022-02-02 NOTE — Assessment & Plan Note (Signed)
Continue supplementation  ?

## 2022-02-02 NOTE — Assessment & Plan Note (Signed)
Encouraged healthy weight loss with diet choices, portion control, and exercise.  

## 2022-02-05 ENCOUNTER — Encounter: Payer: Self-pay | Admitting: Family Medicine

## 2022-02-05 ENCOUNTER — Other Ambulatory Visit (INDEPENDENT_AMBULATORY_CARE_PROVIDER_SITE_OTHER): Payer: Commercial Managed Care - HMO

## 2022-02-05 ENCOUNTER — Other Ambulatory Visit: Payer: Self-pay | Admitting: Family Medicine

## 2022-02-05 DIAGNOSIS — R202 Paresthesia of skin: Secondary | ICD-10-CM

## 2022-02-05 DIAGNOSIS — M79671 Pain in right foot: Secondary | ICD-10-CM

## 2022-02-05 DIAGNOSIS — E1140 Type 2 diabetes mellitus with diabetic neuropathy, unspecified: Secondary | ICD-10-CM

## 2022-02-05 DIAGNOSIS — M79672 Pain in left foot: Secondary | ICD-10-CM

## 2022-02-05 DIAGNOSIS — R200 Anesthesia of skin: Secondary | ICD-10-CM

## 2022-02-05 DIAGNOSIS — E1142 Type 2 diabetes mellitus with diabetic polyneuropathy: Secondary | ICD-10-CM

## 2022-02-05 LAB — VITAMIN B12: Vitamin B-12: 155 pg/mL — ABNORMAL LOW (ref 211–911)

## 2022-02-05 LAB — SURGICAL PATHOLOGY

## 2022-02-05 MED ORDER — TRULICITY 3 MG/0.5ML ~~LOC~~ SOAJ: 3.0000 mg | SUBCUTANEOUS | 1 refills | Status: DC

## 2022-02-06 ENCOUNTER — Telehealth: Payer: Self-pay | Admitting: Pulmonary Disease

## 2022-02-06 DIAGNOSIS — R0683 Snoring: Secondary | ICD-10-CM | POA: Diagnosis not present

## 2022-02-06 NOTE — Telephone Encounter (Signed)
Spoke with the pt and notified of results/recs per Dr Ander Slade. She verbalized understanding. Will keep rov 02/27/22.

## 2022-02-06 NOTE — Telephone Encounter (Signed)
Call patient  Sleep study result  Date of study: 02/02/2022  Impression: Negative for significant obstructive sleep apnea No significant oxygen desaturations  Recommendation: If there is significant clinical concern for sleep disordered breathing, would suggest in lab polysomnogram, however, a negative study will suggest absence of moderate to severe disease  Sleep position optimization by encouraging sleep in a lateral position, elevating the head of the bed by about 30 degrees may help noted snoring  Encourage regular exercise and weight loss efforts  Caution against driving when sleepy & against medications with sedative side effects.  Follow-up as previously scheduled

## 2022-02-10 LAB — IMMUNOFIXATION ELECTROPHORESIS
IgG (Immunoglobin G), Serum: 1433 mg/dL (ref 600–1640)
IgM, Serum: 81 mg/dL (ref 50–300)
Immunoglobulin A: 205 mg/dL (ref 47–310)

## 2022-02-10 LAB — VITAMIN B1: Vitamin B1 (Thiamine): 11 nmol/L (ref 8–30)

## 2022-02-15 ENCOUNTER — Telehealth: Payer: Self-pay

## 2022-02-15 NOTE — Telephone Encounter (Signed)
Pt stated that she know her B12 was low and that Dr Berdine Addison wanted her to start taken 1026mg daily

## 2022-02-15 NOTE — Telephone Encounter (Signed)
-----   Message from Shellia Carwin, MD sent at 02/15/2022  7:56 AM EST ----- Can we call patient and let her know her B12 was low? She should take B12 1000 mcg daily. This can be bought over the counter at any drug store or online.  Thank you,  Kai Levins, MD

## 2022-02-21 ENCOUNTER — Telehealth: Payer: Self-pay | Admitting: Family Medicine

## 2022-02-21 ENCOUNTER — Other Ambulatory Visit: Payer: Self-pay

## 2022-02-21 DIAGNOSIS — E1140 Type 2 diabetes mellitus with diabetic neuropathy, unspecified: Secondary | ICD-10-CM

## 2022-02-21 DIAGNOSIS — R200 Anesthesia of skin: Secondary | ICD-10-CM

## 2022-02-21 DIAGNOSIS — M79671 Pain in right foot: Secondary | ICD-10-CM

## 2022-02-21 DIAGNOSIS — R202 Paresthesia of skin: Secondary | ICD-10-CM

## 2022-02-21 DIAGNOSIS — M79672 Pain in left foot: Secondary | ICD-10-CM

## 2022-02-21 DIAGNOSIS — E1142 Type 2 diabetes mellitus with diabetic polyneuropathy: Secondary | ICD-10-CM

## 2022-02-21 MED ORDER — GABAPENTIN 100 MG PO CAPS: 600.0000 mg | ORAL_CAPSULE | Freq: Every day | ORAL | 5 refills | Status: DC

## 2022-02-21 MED ORDER — TRULICITY 3 MG/0.5ML ~~LOC~~ SOAJ: 3.0000 mg | SUBCUTANEOUS | 1 refills | Status: DC

## 2022-02-21 NOTE — Telephone Encounter (Signed)
Sent!

## 2022-02-21 NOTE — Telephone Encounter (Signed)
Pt has moved and is needing prescriptions sent to another location.   gabapentin (NEURONTIN) 100 MG capsule  Dulaglutide (TRULICITY) 3 PQ/3.3AQ SOPN   [facility] [address] [place], [place] [address]

## 2022-02-27 ENCOUNTER — Ambulatory Visit: Payer: Commercial Managed Care - HMO | Admitting: Nurse Practitioner

## 2022-03-09 ENCOUNTER — Telehealth: Payer: Self-pay

## 2022-03-09 NOTE — Telephone Encounter (Signed)
PA initiated via Covermymeds; KEY: [id number]. PA approved.   Effective from 03/09/2022 through 03/08/2023.

## 2022-04-02 ENCOUNTER — Encounter: Payer: Commercial Managed Care - HMO | Admitting: Neurology

## 2022-04-06 ENCOUNTER — Other Ambulatory Visit (HOSPITAL_COMMUNITY)
Admission: RE | Admit: 2022-04-06 | Discharge: 2022-04-06 | Disposition: A | Discharge status: Home / Self Care | Payer: BLUE CROSS/BLUE SHIELD | Source: Ambulatory Visit | Attending: Family Medicine | Admitting: Family Medicine

## 2022-04-06 ENCOUNTER — Encounter: Payer: Self-pay | Admitting: Family Medicine

## 2022-04-06 ENCOUNTER — Telehealth: Payer: Self-pay

## 2022-04-06 ENCOUNTER — Ambulatory Visit (INDEPENDENT_AMBULATORY_CARE_PROVIDER_SITE_OTHER): Payer: BLUE CROSS/BLUE SHIELD | Admitting: Family Medicine

## 2022-04-06 DIAGNOSIS — E782 Mixed hyperlipidemia: Secondary | ICD-10-CM | POA: Diagnosis not present

## 2022-04-06 DIAGNOSIS — E538 Deficiency of other specified B group vitamins: Secondary | ICD-10-CM | POA: Insufficient documentation

## 2022-04-06 DIAGNOSIS — B9689 Other specified bacterial agents as the cause of diseases classified elsewhere: Secondary | ICD-10-CM

## 2022-04-06 DIAGNOSIS — E1169 Type 2 diabetes mellitus with other specified complication: Secondary | ICD-10-CM

## 2022-04-06 DIAGNOSIS — E559 Vitamin D deficiency, unspecified: Secondary | ICD-10-CM

## 2022-04-06 DIAGNOSIS — N760 Acute vaginitis: Secondary | ICD-10-CM

## 2022-04-06 DIAGNOSIS — Z6841 Body Mass Index (BMI) 40.0 and over, adult: Secondary | ICD-10-CM

## 2022-04-06 DIAGNOSIS — Z113 Encounter for screening for infections with a predominantly sexual mode of transmission: Secondary | ICD-10-CM | POA: Diagnosis not present

## 2022-04-06 MED ORDER — ATORVASTATIN CALCIUM 10 MG PO TABS: 10.0000 mg | ORAL_TABLET | Freq: Every day | ORAL | 3 refills | Status: DC

## 2022-04-06 MED ORDER — VITAMIN D3 25 MCG (1000 UT) PO CAPS: 1000.0000 [IU] | ORAL_CAPSULE | Freq: Every day | ORAL | 3 refills | Status: DC

## 2022-04-06 MED ORDER — BLOOD GLUCOSE MONITORING SUPPL DEVI: 1.0000 | Freq: Three times a day (TID) | 0 refills | Status: DC

## 2022-04-06 MED ORDER — TIRZEPATIDE 2.5 MG/0.5ML ~~LOC~~ SOAJ: 2.5000 mg | SUBCUTANEOUS | 1 refills | Status: DC

## 2022-04-06 MED ORDER — LANCET DEVICE MISC: 1.0000 | Freq: Three times a day (TID) | 0 refills | Status: AC

## 2022-04-06 MED ORDER — BLOOD GLUCOSE TEST VI STRP: 1.0000 | ORAL_STRIP | Freq: Three times a day (TID) | 0 refills | Status: AC

## 2022-04-06 MED ORDER — VITAMIN B-12 1000 MCG PO TABS: 1000.0000 ug | ORAL_TABLET | Freq: Every day | ORAL | 1 refills | Status: DC

## 2022-04-06 MED ORDER — LANCETS MISC. MISC: 1.0000 | Freq: Three times a day (TID) | 0 refills | Status: AC

## 2022-04-06 NOTE — Assessment & Plan Note (Signed)
Changing to [facility] to see if we have less supply issues. We will have to see if your insurance approves.  Continue with lifestyle measures.  Monitor glucose at least each  morning (fasting) A1c at next follow-up

## 2022-04-06 NOTE — Assessment & Plan Note (Signed)
Encouraged healthy weight loss with diet choices, portion control, and exercise.

## 2022-04-06 NOTE — Telephone Encounter (Signed)
PA approved.   Effective from 04/06/2022 through 04/05/2023.

## 2022-04-06 NOTE — Assessment & Plan Note (Signed)
Continue supplementation  ?

## 2022-04-06 NOTE — Patient Instructions (Addendum)
Sent to [facility]  [address], [place], [place] [address] Phone: [phone]   Changing to [facility] to see if we have less supply issues. We will have to see if your insurance approves.  Continue with lifestyle measures.  Monitor glucose at least each  morning (fasting)  STD screening today. We will let you know results.   Update routine labs and next follow-up appointment.

## 2022-04-06 NOTE — Progress Notes (Signed)
Established Patient Office Visit  Subjective   [name], female    DOB: Dec 04, 1980  Age: 42 y.o. MRN: [id number]  CC:  follow-up, chronic disease management - refills    HPI  She recently moved back here from [place]. States her insurance recently changed and she has been out of meds for awhile. She also had trouble getting Trulicity due to supply issues.    DIABETES: - Checking BG at home: no - Medications: Trulicity 3 mg weekly - Compliance: she has not been on any meds recently due to supply issues - Diet: low carb, mostly vegetarian - Exercise: minimal - Eye exam: referral placed last visit  - Foot exam: today  - Microalbumin: UTD - Denies symptoms of hypoglycemia, polyuria, polydipsia, foot ulcers/trauma, wounds that are not healing, medication side effects  - She is willing to try Mounjaro if her insurance will cover and hopefully she will not run into supply issues Lab Results  Component Value Date   HGBA1C 7.5 (H) 02/02/2022      HYPERLIPIDEMIA - medications: atorvastatin 10 mg daily - compliance: ran out about 6 weeks ago - medication SEs: none The 10-year ASCVD risk score (Arnett DK, et al., 2019) is: 2.5%   Values used to calculate the score:     Age: 26 years     Sex: Female     Is Non-Hispanic African American: Yes     Diabetic: Yes     Tobacco smoker: No     Systolic Blood Pressure: Q000111Q mmHg     Is BP treated: No     HDL Cholesterol: 45 mg/dL     Total Cholesterol: 175 mg/dL   Vitamin D Deficiency: - Oral supplementation 1,000 units daily - ran out about 6 weeks ago  Vitamin B12 deficiency: - Neurology has recommended she start B12 1,000 mcg daily, but she has not yet started.  New sex partner: - Reports new female partner, not using protection. - She would like routine screening. She has not noticed any symptoms.     ROS All review of systems negative except what is listed in the HPI    Objective:     BP 132/69    Pulse 68   Temp 98.8 F (37.1 C)   Resp 16   Ht 5' 6"$  (1.676 m)   Wt 239 lb 3.2 oz (108.5 kg)   SpO2 100%   BMI 38.61 kg/m    Physical Exam Vitals reviewed.  Constitutional:      General: She is not in acute distress.    Appearance: Normal appearance. She is obese. She is not ill-appearing.  Cardiovascular:     Rate and Rhythm: Normal rate and regular rhythm.  Pulmonary:     Effort: Pulmonary effort is normal.     Breath sounds: Normal breath sounds.  Musculoskeletal:        General: Normal range of motion.     Cervical back: Normal range of motion and neck supple.     Right lower leg: No edema.     Left lower leg: No edema.  Feet:     Comments: Diabetic foot exam was performed.  No deformities or other abnormal visual findings.  Posterior tibialis and dorsalis pulse intact bilaterally.  Intact to touch and monofilament testing bilaterally.   Skin:    General: Skin is warm and dry.  Neurological:     General: No focal deficit present.     Mental Status: She is alert and  oriented to person, place, and time. Mental status is at baseline.  Psychiatric:        Mood and Affect: Mood normal.        Behavior: Behavior normal.        Thought Content: Thought content normal.        Judgment: Judgment normal.      No results found for any visits on 04/06/22.     The 10-year ASCVD risk score (Arnett DK, et al., 2019) is: 2.5%    Assessment & Plan:   Problem List Items Addressed This Visit       Endocrine   Type 2 diabetes mellitus with morbid obesity ([place]) - Primary    Changing to [facility] to see if we have less supply issues. We will have to see if your insurance approves.  Continue with lifestyle measures.  Monitor glucose at least each  morning (fasting) A1c at next follow-up      Relevant Medications   Blood Glucose Monitoring Suppl DEVI   Glucose Blood (BLOOD GLUCOSE TEST STRIPS) STRP   Lancet Device MISC   Lancets Misc. MISC   atorvastatin (LIPITOR)  10 MG tablet   tirzepatide (MOUNJARO) 2.5 MG/0.5ML Pen     Other   Class 3 severe obesity due to excess calories with serious comorbidity and body mass index (BMI) of 40.0 to 44.9 in adult [facility])    Encouraged healthy weight loss with diet choices, portion control, and exercise.       Relevant Medications   tirzepatide (MOUNJARO) 2.5 MG/0.5ML Pen   Vitamin D deficiency    Continue supplementation       Relevant Medications   Cholecalciferol (VITAMIN D3) 25 MCG (1000 UT) CAPS   Mixed hyperlipidemia    Refill atorvastatin Labs at next follow-up Lifestyle factors for lowering cholesterol include: Diet therapy - heart-healthy diet rich in fruits, veggies, fiber-rich whole grains, lean meats, chicken, fish (at least twice a week), fat-free or 1% dairy products; foods low in saturated/trans fats, cholesterol, sodium, and sugar. Mediterranean diet has shown to be very heart healthy. Regular exercise - recommend at least 30 minutes a day, 5 times per week Weight management        Relevant Medications   atorvastatin (LIPITOR) 10 MG tablet   B12 deficiency    Start supplementation       Relevant Medications   cyanocobalamin (VITAMIN B12) 1000 MCG tablet   Other Visit Diagnoses     Screen for STD (sexually transmitted disease)       Relevant Orders   Cervicovaginal ancillary only   HIV Antibody (routine testing w rflx)   RPR          Return if symptoms worsen or fail to improve, for - as scheduled or sooner if needed .     Terrilyn Saver, NP

## 2022-04-06 NOTE — Telephone Encounter (Signed)
PA initiated via Covermymeds; KEY: [id number]. Awaiting determination.

## 2022-04-06 NOTE — Assessment & Plan Note (Signed)
Start supplementation 

## 2022-04-06 NOTE — Assessment & Plan Note (Signed)
Refill atorvastatin Labs at next follow-up Lifestyle factors for lowering cholesterol include: Diet therapy - heart-healthy diet rich in fruits, veggies, fiber-rich whole grains, lean meats, chicken, fish (at least twice a week), fat-free or 1% dairy products; foods low in saturated/trans fats, cholesterol, sodium, and sugar. Mediterranean diet has shown to be very heart healthy. Regular exercise - recommend at least 30 minutes a day, 5 times per week Weight management

## 2022-04-07 LAB — RPR: RPR Ser Ql: NONREACTIVE

## 2022-04-07 LAB — HIV ANTIBODY (ROUTINE TESTING W REFLEX): HIV 1&2 Ab, 4th Generation: NONREACTIVE

## 2022-04-09 LAB — CERVICOVAGINAL ANCILLARY ONLY
Bacterial Vaginitis (gardnerella): POSITIVE — AB
Candida Glabrata: NEGATIVE
Candida Vaginitis: NEGATIVE
Chlamydia: NEGATIVE
Comment: NEGATIVE
Comment: NEGATIVE
Comment: NEGATIVE
Comment: NEGATIVE
Comment: NEGATIVE
Comment: NORMAL
Neisseria Gonorrhea: NEGATIVE
Trichomonas: NEGATIVE

## 2022-04-09 MED ORDER — METRONIDAZOLE 500 MG PO TABS: 500.0000 mg | ORAL_TABLET | Freq: Two times a day (BID) | ORAL | 0 refills | Status: AC

## 2022-04-09 NOTE — Addendum Note (Signed)
Addended by: Caleen Jobs B on: 04/09/2022 05:07 PM   Modules accepted: Orders

## 2022-04-16 ENCOUNTER — Encounter: Payer: Self-pay | Admitting: Family Medicine

## 2022-05-08 ENCOUNTER — Other Ambulatory Visit: Payer: Self-pay

## 2022-05-08 ENCOUNTER — Emergency Department (HOSPITAL_BASED_OUTPATIENT_CLINIC_OR_DEPARTMENT_OTHER)
Admission: EM | Admit: 2022-05-08 | Discharge: 2022-05-09 | Disposition: A | Discharge status: Home / Self Care | Payer: BLUE CROSS/BLUE SHIELD | Attending: Emergency Medicine | Admitting: Emergency Medicine

## 2022-05-08 DIAGNOSIS — R059 Cough, unspecified: Secondary | ICD-10-CM | POA: Diagnosis present

## 2022-05-08 DIAGNOSIS — E119 Type 2 diabetes mellitus without complications: Secondary | ICD-10-CM | POA: Diagnosis not present

## 2022-05-08 DIAGNOSIS — J00 Acute nasopharyngitis [common cold]: Secondary | ICD-10-CM | POA: Diagnosis not present

## 2022-05-08 DIAGNOSIS — Z87891 Personal history of nicotine dependence: Secondary | ICD-10-CM | POA: Diagnosis not present

## 2022-05-08 DIAGNOSIS — Z20822 Contact with and (suspected) exposure to covid-19: Secondary | ICD-10-CM | POA: Insufficient documentation

## 2022-05-08 DIAGNOSIS — R110 Nausea: Secondary | ICD-10-CM | POA: Insufficient documentation

## 2022-05-08 DIAGNOSIS — Z91040 Latex allergy status: Secondary | ICD-10-CM | POA: Diagnosis not present

## 2022-05-08 LAB — URINALYSIS, ROUTINE W REFLEX MICROSCOPIC
Bilirubin Urine: NEGATIVE
Glucose, UA: 250 mg/dL — AB
Hgb urine dipstick: NEGATIVE
Ketones, ur: 15 mg/dL — AB
Leukocytes,Ua: NEGATIVE
Nitrite: NEGATIVE
Protein, ur: NEGATIVE mg/dL
Specific Gravity, Urine: >=1.03 (ref 1.005–1.030)
pH: 6 (ref 5.0–8.0)

## 2022-05-08 LAB — RESP PANEL BY RT-PCR (RSV, FLU A&B, COVID)  RVPGX2
Influenza A by PCR: NEGATIVE
Influenza B by PCR: NEGATIVE
Resp Syncytial Virus by PCR: NEGATIVE
SARS Coronavirus 2 by RT PCR: NEGATIVE

## 2022-05-08 LAB — PREGNANCY, URINE: Preg Test, Ur: NEGATIVE

## 2022-05-08 MED ORDER — CETIRIZINE HCL 10 MG PO TABS: 10.0000 mg | ORAL_TABLET | Freq: Every day | ORAL | 0 refills | Status: DC

## 2022-05-08 MED ORDER — ONDANSETRON 4 MG PO TBDP: 4.0000 mg | ORAL_TABLET | Freq: Four times a day (QID) | ORAL | 0 refills | Status: DC | PRN

## 2022-05-08 NOTE — ED Provider Notes (Signed)
[facility] Provider Note   CSN: [id number] Arrival date & time: 05/08/22  2027     History {Add pertinent medical, surgical, social history, OB history to HPI:1} Chief Complaint  Patient presents with   Flu like symptoms    Colleen Downs is a 42 y.o. female.  With medical history of diabetes, starting [facility] tomorrow.  States she wanted to make sure she did not have COVID, flu or was pregnant.  No fevers or chills, no abdominal pain, no vomiting, states she recently quit smoking and has been trying to exercise to lose weight.  She states she has had runny nose, mild cough, nausea for the past week and wanted to be evaluated.  HPI     Home Medications Prior to Admission medications   Medication Sig Start Date End Date Taking? Authorizing Provider  atorvastatin (LIPITOR) 10 MG tablet Take 1 tablet (10 mg total) by mouth daily. 04/06/22   Terrilyn Saver, NP  blood glucose meter kit and supplies KIT Dispense based on patient and insurance preference. Use up to four times daily as directed. Please include lancets, test strips, control solution. 06/13/21   Terrilyn Saver, NP  Blood Glucose Monitoring Suppl DEVI 1 each by Does not apply route in the morning, at noon, and at bedtime. May substitute to any manufacturer covered by patient's insurance. 04/06/22   Terrilyn Saver, NP  Cholecalciferol (VITAMIN D3) 25 MCG (1000 UT) CAPS Take 1 capsule (1,000 Units total) by mouth daily. 04/06/22   Terrilyn Saver, NP  cyanocobalamin (VITAMIN B12) 1000 MCG tablet Take 1 tablet (1,000 mcg total) by mouth daily. 04/06/22   Terrilyn Saver, NP  fluticasone (FLONASE) 50 MCG/ACT nasal spray Place 2 sprays into both nostrils daily for 3 days. 08/27/21 08/30/21  Gareth Morgan, MD  gabapentin (NEURONTIN) 100 MG capsule Take 6 capsules (600 mg total) by mouth at bedtime. 02/21/22   Terrilyn Saver, NP  ondansetron (ZOFRAN-ODT) 8 MG disintegrating tablet Take 1 tablet (8 mg  total) by mouth every 8 (eight) hours as needed for nausea. 07/13/21   Terrilyn Saver, NP  tirzepatide [facility]) 2.5 MG/0.5ML Pen Inject 2.5 mg into the skin once a week. 04/06/22   Terrilyn Saver, NP  triamcinolone cream (KENALOG) 0.5 % Apply 1 Application topically 2 (two) times daily. To affected areas. 08/16/21   Terrilyn Saver, NP  famotidine (PEPCID) 20 MG tablet Take 1 tablet (20 mg total) by mouth 2 (two) times daily. Patient not taking: Reported on 05/17/2020 01/04/20 05/17/20  Antony Blackbird, MD      Allergies    Sulfa drugs cross reactors and Latex    Review of Systems   Review of Systems  Physical Exam Updated Vital Signs BP (!) 141/73 (BP Location: Left Arm)   Pulse (!) 59   Temp 98 F (36.7 C)   Resp 18   Ht '5\' 6"'$  (1.676 m)   Wt 106.6 kg   LMP 04/22/2022 (Exact Date)   SpO2 100%   BMI 37.93 kg/m  Physical Exam Vitals and nursing note reviewed.  Constitutional:      General: She is not in acute distress.    Appearance: She is well-developed.  HENT:     Head: Normocephalic and atraumatic.     Mouth/Throat:     Mouth: Mucous membranes are moist.  Eyes:     Conjunctiva/sclera: Conjunctivae normal.  Cardiovascular:     Rate and Rhythm: Normal  rate and regular rhythm.     Heart sounds: No murmur heard. Pulmonary:     Effort: Pulmonary effort is normal. No respiratory distress.     Breath sounds: Normal breath sounds.  Abdominal:     Palpations: Abdomen is soft.     Tenderness: There is no abdominal tenderness.  Musculoskeletal:        General: No swelling.     Cervical back: Neck supple.  Skin:    General: Skin is warm and dry.     Capillary Refill: Capillary refill takes less than 2 seconds.  Neurological:     General: No focal deficit present.     Mental Status: She is alert and oriented to person, place, and time.  Psychiatric:        Mood and Affect: Mood normal.     ED Results / Procedures / Treatments   Labs (all labs ordered are listed, but only  abnormal results are displayed) Labs Reviewed  URINALYSIS, ROUTINE W REFLEX MICROSCOPIC - Abnormal; Notable for the following components:      Result Value   Glucose, UA 250 (*)    Ketones, ur 15 (*)    All other components within normal limits  RESP PANEL BY RT-PCR (RSV, FLU A&B, COVID)  RVPGX2  PREGNANCY, URINE    EKG None  Radiology No results found.  Procedures Procedures  {Document cardiac monitor, telemetry assessment procedure when appropriate:1}  Medications Ordered in ED Medications - No data to display  ED Course/ Medical Decision Making/ A&P   {   Click here for ABCD2, HEART and other calculatorsREFRESH Note before signing :1}                          Medical Decision Making DDX: Allergic rhinitis, dehydration, pregnancy, COVID, influenza, other ED course: Patient has been feeling unwell for about a week with nausea, runny nose, mild cough.  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  She denies chest pain or shortness of breath, no fevers or chills, no vomiting. Patient is not pregnant, negative COVID flu RSV, urinalysis was also ordered in triage, urine is somewhat concentrated with glucose and small ketones but patient is very well-appearing, she is not tachycardic or tachypneic, I do not feel she has DKA/HHS.  She is starting new diabetes medications tomorrow.  Advised on nausea medicine as needed, drink plenty of fluids, follow-up with her primary care doctor.  Given strict return precautions.  Amount and/or Complexity of Data Reviewed Labs: ordered.   ***  {Document critical care time when appropriate:1} {Document review of labs and clinical decision tools ie heart score, Chads2Vasc2 etc:1}  {Document your independent review of radiology images, and any outside  records:1} {Document your discussion with family members, caretakers, and with consultants:1} {Document social determinants of health affecting pt's care:1} {Document your decision making why or why not admission, treatments were needed:1} Final Clinical Impression(s) / ED Diagnoses Final diagnoses:  None    Rx / DC Orders ED Discharge Orders     None

## 2022-05-08 NOTE — Discharge Instructions (Signed)
Seen today for runny nose nausea and feeling generally unwell, you are negative for COVID flu and RSV, you do do not have UTI, you are not pregnant.  Drink plenty of water, follow-up with your doctor.  Since you are starting [facility] tomorrow your nausea is likely to increase.  You are given a prescription for Zofran to help with nausea.

## 2022-05-08 NOTE — ED Notes (Signed)
Pt. Here to make sure she is not pregnant and to make sure she does not have the flu.  No distress noted in the Pt.   Pt. Does report slight back ache and some runny nose.

## 2022-05-08 NOTE — ED Triage Notes (Signed)
Pt arrives requesting flu, COVID and pregnancy test. Pt reports headache, body aches, back pain, nausea.

## 2022-05-11 ENCOUNTER — Ambulatory Visit: Payer: Commercial Managed Care - HMO | Admitting: Family Medicine

## 2022-06-08 ENCOUNTER — Ambulatory Visit: Payer: BLUE CROSS/BLUE SHIELD | Admitting: Family Medicine

## 2022-06-10 NOTE — Progress Notes (Deleted)
   Established Patient Office Visit  Subjective   [name], female    DOB: 1980/06/30  Age: 42 y.o. MRN: [id number]  No chief complaint on file.   HPI  Patient is here for DM follow-up. She was last seen by me for chronic disease management on 04/06/22. She was having trouble getting Trulicity due to supply issues, so we opted to switch to [facility].   Diabetes: - Checking BG at home: *** - Medications: *** Mounjaro 2.5 mg/week - Compliance: *** - Diet: low carb, mostly vegetarian - Exercise: *** - Eye exam: referral placed recently *** - Foot exam: UTD - Microalbumin: UTD - Denies symptoms of hypoglycemia, polyuria, polydipsia, numbness extremities, foot ulcers/trauma, wounds that are not healing, medication side effects  Lab Results  Component Value Date   HGBA1C 7.5 (H) 02/02/2022       {History (Optional):23778}  ROS    Objective:     There were no vitals taken for this visit. {Vitals History (Optional):23777}  Physical Exam   No results found for any visits on 06/11/22.  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    Clayborne Dana, NP

## 2022-06-11 ENCOUNTER — Telehealth: Payer: Self-pay

## 2022-06-11 ENCOUNTER — Ambulatory Visit: Payer: BLUE CROSS/BLUE SHIELD | Admitting: Family Medicine

## 2022-06-11 DIAGNOSIS — E1169 Type 2 diabetes mellitus with other specified complication: Secondary | ICD-10-CM

## 2022-06-11 NOTE — Telephone Encounter (Signed)
Needs to reschedule appt

## 2022-06-11 NOTE — Telephone Encounter (Signed)
Spoke to patient and she stated she would call back to r/s.

## 2022-06-11 NOTE — Telephone Encounter (Signed)
Appt cancelled for today.

## 2022-06-11 NOTE — Telephone Encounter (Signed)
Caller Name [name] Caller Phone Number [phone] Patient Name Colleen Downs Patient DOB 01/07/1981 Call Type Message Only Information Provided Reason for Call Request to Reschedule Office Appointment Initial Comment Caller states that she has a 10am appointment with Hyman Hopes. She needs to cancel due to illness. Declined triage. She would like to reschedule. Disp. Time Disposition Final User 06/11/2022 7:41:12 AM General Information Provided Yes [name] Call Closed By: [name] Transaction Date/Time: 06/11/2022 7:38:01 AM (ET)

## 2022-06-12 ENCOUNTER — Emergency Department (HOSPITAL_BASED_OUTPATIENT_CLINIC_OR_DEPARTMENT_OTHER): Payer: BLUE CROSS/BLUE SHIELD

## 2022-06-12 ENCOUNTER — Encounter (HOSPITAL_BASED_OUTPATIENT_CLINIC_OR_DEPARTMENT_OTHER): Payer: Self-pay

## 2022-06-12 ENCOUNTER — Other Ambulatory Visit: Payer: Self-pay

## 2022-06-12 ENCOUNTER — Emergency Department (HOSPITAL_BASED_OUTPATIENT_CLINIC_OR_DEPARTMENT_OTHER)
Admission: EM | Admit: 2022-06-12 | Discharge: 2022-06-13 | Disposition: A | Discharge status: Home / Self Care | Payer: BLUE CROSS/BLUE SHIELD | Attending: Emergency Medicine | Admitting: Emergency Medicine

## 2022-06-12 DIAGNOSIS — H6092 Unspecified otitis externa, left ear: Secondary | ICD-10-CM | POA: Insufficient documentation

## 2022-06-12 DIAGNOSIS — Z87891 Personal history of nicotine dependence: Secondary | ICD-10-CM | POA: Diagnosis not present

## 2022-06-12 DIAGNOSIS — R600 Localized edema: Secondary | ICD-10-CM | POA: Diagnosis present

## 2022-06-12 DIAGNOSIS — E119 Type 2 diabetes mellitus without complications: Secondary | ICD-10-CM | POA: Diagnosis not present

## 2022-06-12 LAB — BASIC METABOLIC PANEL
Anion gap: 7 (ref 5–15)
BUN: 7 mg/dL (ref 6–20)
CO2: 24 mmol/L (ref 22–32)
Calcium: 8.9 mg/dL (ref 8.9–10.3)
Chloride: 102 mmol/L (ref 98–111)
Creatinine, Ser: 0.72 mg/dL (ref 0.44–1.00)
GFR, Estimated: >60 mL/min (ref >60)
Glucose, Bld: 100 mg/dL — ABNORMAL HIGH (ref 70–99)
Potassium: 3.8 mmol/L (ref 3.5–5.1)
Sodium: 133 mmol/L — ABNORMAL LOW (ref 135–145)

## 2022-06-12 LAB — CBC WITH DIFFERENTIAL/PLATELET
Abs Immature Granulocytes: 0.03 10*3/uL (ref 0.00–0.07)
Basophils Absolute: 0 10*3/uL (ref 0.0–0.1)
Basophils Relative: 0 %
Eosinophils Absolute: 0.2 10*3/uL (ref 0.0–0.5)
Eosinophils Relative: 2 %
HCT: 38.3 % (ref 36.0–46.0)
Hemoglobin: 12.7 g/dL (ref 12.0–15.0)
Immature Granulocytes: 0 %
Lymphocytes Relative: 37 %
Lymphs Abs: 3.8 10*3/uL (ref 0.7–4.0)
MCH: 30.8 pg (ref 26.0–34.0)
MCHC: 33.2 g/dL (ref 30.0–36.0)
MCV: 93 fL (ref 80.0–100.0)
Monocytes Absolute: 0.7 10*3/uL (ref 0.1–1.0)
Monocytes Relative: 7 %
Neutro Abs: 5.6 10*3/uL (ref 1.7–7.7)
Neutrophils Relative %: 54 %
Platelets: 350 10*3/uL (ref 150–400)
RBC: 4.12 MIL/uL (ref 3.87–5.11)
RDW: 13.5 % (ref 11.5–15.5)
WBC: 10.3 10*3/uL (ref 4.0–10.5)
nRBC: 0 % (ref 0.0–0.2)

## 2022-06-12 LAB — HCG, SERUM, QUALITATIVE: Preg, Serum: NEGATIVE

## 2022-06-12 MED ORDER — IBUPROFEN 800 MG PO TABS
800.0000 mg | ORAL_TABLET | Freq: Once | ORAL | Status: AC
  Administered 2022-06-12: 800 mg via ORAL
  Filled 2022-06-12: qty 1

## 2022-06-12 MED ORDER — CIPROFLOXACIN-DEXAMETHASONE 0.3-0.1 % OT SUSP: 4.0000 [drp] | Freq: Two times a day (BID) | OTIC | 0 refills | Status: AC

## 2022-06-12 MED ORDER — DOXYCYCLINE HYCLATE 100 MG PO CAPS: 100.0000 mg | ORAL_CAPSULE | Freq: Two times a day (BID) | ORAL | 0 refills | Status: AC

## 2022-06-12 MED ORDER — ACETAMINOPHEN 500 MG PO TABS
1000.0000 mg | ORAL_TABLET | Freq: Once | ORAL | Status: AC
  Administered 2022-06-12: 1000 mg via ORAL
  Filled 2022-06-12: qty 2

## 2022-06-12 MED ORDER — IBUPROFEN 600 MG PO TABS: 600.0000 mg | ORAL_TABLET | Freq: Four times a day (QID) | ORAL | 0 refills | Status: DC | PRN

## 2022-06-12 MED ORDER — IOHEXOL 300 MG/ML  SOLN
75.0000 mL | Freq: Once | INTRAMUSCULAR | Status: AC | PRN
  Administered 2022-06-12: 75 mL via INTRAVENOUS

## 2022-06-12 MED ORDER — ACETAMINOPHEN 325 MG PO TABS: 650.0000 mg | ORAL_TABLET | Freq: Four times a day (QID) | ORAL | 0 refills | Status: DC | PRN

## 2022-06-12 MED ORDER — SODIUM CHLORIDE 0.9 % IV BOLUS
1000.0000 mL | Freq: Once | INTRAVENOUS | Status: AC
  Administered 2022-06-12: 1000 mL via INTRAVENOUS

## 2022-06-12 NOTE — ED Provider Notes (Signed)
[facility] Provider Note  CSN: [id number] Arrival date & time: 06/12/22 1943  Chief Complaint(s) Facial Swelling  HPI Colleen Downs is a 42 y.o. female with past medical history as below, significant for DM, HLD, marijuana use, IBS, obesity who presents to the ED with complaint of left ear pain.  Patient reports over the past year she has had intermittent swelling and pain to her left ear, occasionally will have purulent drainage to the tissue surrounding her ear.  Reports that over the past few days the swelling has become significantly worse than it was previously.  Noticed a tender, swollen area to the tissue immediately in front of her left ear.  Noticed some purulent drainage coming out of her ear canal.  No recent change to her hearing, no hearing loss, no fevers.  No significant pain with neck movement, eye movement or jaw movement.  No difficulty swallowing.  Recent head trauma.  She reports that she has been seen by her PCP for this problem in the past but her PCP has been unable to get her referred to a specialist to evaluate her ear.  Past Medical History Past Medical History:  Diagnosis Date   Diabetes mellitus without complication    Diabetic neuropathy    Diarrhea    High cholesterol    Irregular periods    Patient Active Problem List   Diagnosis Date Noted   B12 deficiency 04/06/2022   Sebaceous cyst 10/25/2021   Snoring 10/12/2021   Dermoid cyst of ear, left 09/26/2020   Major depression 09/26/2020   Bartholin cyst 05/17/2020   Mixed hyperlipidemia 09/04/2019   Vitamin D deficiency 09/03/2019   COVID-19 virus vaccination declined 09/03/2019   Marijuana use, continuous 09/03/2019   Bipolar depression 03/19/2017   Irritable bowel syndrome with both constipation and diarrhea 12/25/2016   Type 2 diabetes mellitus with morbid obesity 12/25/2016   Class 3 severe obesity due to excess calories with serious comorbidity and body  mass index (BMI) of 40.0 to 44.9 in adult 12/25/2016   Home Medication(s) Prior to Admission medications   Medication Sig Start Date End Date Taking? Authorizing Provider  atorvastatin (LIPITOR) 10 MG tablet Take 1 tablet (10 mg total) by mouth daily. 04/06/22   Clayborne Dana, NP  blood glucose meter kit and supplies KIT Dispense based on patient and insurance preference. Use up to four times daily as directed. Please include lancets, test strips, control solution. 06/13/21   Clayborne Dana, NP  Blood Glucose Monitoring Suppl DEVI 1 each by Does not apply route in the morning, at noon, and at bedtime. May substitute to any manufacturer covered by patient's insurance. 04/06/22   Clayborne Dana, NP  cetirizine (ZYRTEC ALLERGY) 10 MG tablet Take 1 tablet (10 mg total) by mouth daily. 05/08/22   Carmel Sacramento A, PA-C  Cholecalciferol (VITAMIN D3) 25 MCG (1000 UT) CAPS Take 1 capsule (1,000 Units total) by mouth daily. 04/06/22   Clayborne Dana, NP  cyanocobalamin (VITAMIN B12) 1000 MCG tablet Take 1 tablet (1,000 mcg total) by mouth daily. 04/06/22   Clayborne Dana, NP  fluticasone (FLONASE) 50 MCG/ACT nasal spray Place 2 sprays into both nostrils daily for 3 days. 08/27/21 08/30/21  Alvira Monday, MD  gabapentin (NEURONTIN) 100 MG capsule Take 6 capsules (600 mg total) by mouth at bedtime. 02/21/22   Clayborne Dana, NP  ondansetron (ZOFRAN-ODT) 4 MG disintegrating tablet Take 1 tablet (4 mg total) by mouth  every 6 (six) hours as needed for nausea or vomiting. 05/08/22   Carmel Sacramento A, PA-C  ondansetron (ZOFRAN-ODT) 8 MG disintegrating tablet Take 1 tablet (8 mg total) by mouth every 8 (eight) hours as needed for nausea. 07/13/21   Clayborne Dana, NP  tirzepatide [facility]) 2.5 MG/0.5ML Pen Inject 2.5 mg into the skin once a week. 04/06/22   Clayborne Dana, NP  triamcinolone cream (KENALOG) 0.5 % Apply 1 Application topically 2 (two) times daily. To affected areas. 08/16/21   Clayborne Dana, NP  famotidine  (PEPCID) 20 MG tablet Take 1 tablet (20 mg total) by mouth 2 (two) times daily. Patient not taking: Reported on 05/17/2020 01/04/20 05/17/20  Cain Saupe, MD                                                                                                                                    Past Surgical History Past Surgical History:  Procedure Laterality Date   CHOLECYSTECTOMY     Family History Family History  Problem Relation Age of Onset   Rheum arthritis Mother    Rheum arthritis Sister    Arthritis Maternal Uncle    Arthritis Paternal Uncle    Diabetes Maternal Grandmother     Social History Social History   Tobacco Use   Smoking status: Former    Types: Cigarettes   Smokeless tobacco: Never   Tobacco comments:    Has periods that she smokes for a little while and then stops for a while.  10/12/2021 hfb  12/7/23cigars one or two a day  Vaping Use   Vaping Use: Never used  Substance Use Topics   Alcohol use: Yes    Comment: social   Drug use: Yes    Types: Marijuana   Allergies Sulfa drugs cross reactors and Latex  Review of Systems Review of Systems  Constitutional:  Negative for activity change and fever.  HENT:  Positive for ear discharge and ear pain. Negative for facial swelling and trouble swallowing.   Eyes:  Negative for discharge and redness.  Respiratory:  Negative for cough and shortness of breath.   Cardiovascular:  Negative for chest pain and palpitations.  Gastrointestinal:  Negative for abdominal pain and nausea.  Genitourinary:  Negative for dysuria and flank pain.  Musculoskeletal:  Negative for back pain and gait problem.  Skin:  Positive for wound. Negative for pallor and rash.  Neurological:  Negative for syncope and headaches.    Physical Exam Vital Signs  I have reviewed the triage vital signs BP 130/79   Pulse 76   Temp 99.1 F (37.3 C) (Oral)   Resp 18   Ht 5\' 6"  (1.676 m)   Wt 106.6 kg   LMP 05/18/2022 (Exact Date)   SpO2 100%    BMI 37.93 kg/m  Physical Exam Vitals and nursing note reviewed.  Constitutional:      General: She is  not in acute distress.    Appearance: Normal appearance.  HENT:     Head: Normocephalic and atraumatic. No raccoon eyes, Battle's sign, right periorbital erythema or left periorbital erythema.     Jaw: There is normal jaw occlusion. No trismus.      Right Ear: Tympanic membrane and external ear normal. No tenderness. No mastoid tenderness. No hemotympanum. Tympanic membrane is not injected.     Left Ear: Tenderness present. There is mastoid tenderness. No hemotympanum. Tympanic membrane is injected.     Nose: Nose normal.     Mouth/Throat:     Mouth: Mucous membranes are moist.  Eyes:     General: No scleral icterus.       Right eye: No discharge.        Left eye: No discharge.  Cardiovascular:     Rate and Rhythm: Normal rate and regular rhythm.     Pulses: Normal pulses.     Heart sounds: Normal heart sounds.  Pulmonary:     Effort: Pulmonary effort is normal. No respiratory distress.     Breath sounds: Normal breath sounds.  Abdominal:     General: Abdomen is flat.     Tenderness: There is no abdominal tenderness.  Musculoskeletal:     Right lower leg: No edema.     Left lower leg: No edema.  Skin:    General: Skin is warm and dry.     Capillary Refill: Capillary refill takes less than 2 seconds.  Neurological:     Mental Status: She is alert.  Psychiatric:        Mood and Affect: Mood normal.        Behavior: Behavior normal.     ED Results and Treatments Labs (all labs ordered are listed, but only abnormal results are displayed) Labs Reviewed  BASIC METABOLIC PANEL - Abnormal; Notable for the following components:      Result Value   Sodium 133 (*)    Glucose, Bld 100 (*)    All other components within normal limits  CBC WITH DIFFERENTIAL/PLATELET  HCG, SERUM, QUALITATIVE                                                                                                                           Radiology No results found.  Pertinent labs & imaging results that were available during my care of the patient were reviewed by me and considered in my medical decision making (see MDM for details).  Medications Ordered in ED Medications  sodium chloride 0.9 % bolus 1,000 mL (1,000 mLs Intravenous New Bag/Given 06/12/22 2151)  acetaminophen (TYLENOL) tablet 1,000 mg (1,000 mg Oral Given 06/12/22 2149)  ibuprofen (ADVIL) tablet 800 mg (800 mg Oral Given 06/12/22 2149)  iohexol (OMNIPAQUE) 300 MG/ML solution 75 mL (75 mLs Intravenous Contrast Given 06/12/22 2242)  Procedures Procedures  (including critical care time)  Medical Decision Making / ED Course    Medical Decision Making:    Shuntia Exton is a 42 y.o. female with past medical history as below, significant for DM, HLD, marijuana use, IBS, obesity who presents to the ED with complaint of left ear pain.. The complaint involves an extensive differential diagnosis and also carries with it a high risk of complications and morbidity.  Serious etiology was considered. Ddx includes but is not limited to: Otitis media, intact externa, malignant otitis externa, mastoiditis, superficial abscess, soft tissue infection, etc.  Complete initial physical exam performed, notably the patient  was no acute distress, resting comfortably.    Reviewed and confirmed nursing documentation for past medical history, family history, social history.  Vital signs reviewed.    Clinical Course as of 06/12/22 2313  Tue Jun 12, 2022  2311 WBC: 10.3 [SG]    Clinical Course User Index [SG] Sloan Leiter, DO   Labs reviewed, these are stable.  Patient with mastoid tenderness, concern for purulent drainage from her right ear canal.  There is a soft tissue swelling, induration to her preauricular  area.  Patient does report ongoing problems over the past year.  Symptoms worsen.  Will further evaluate with CT of the temporal bones with IV contrast. Give analgesic. IVF   Additional history obtained: -Additional history obtained from na -External records from outside source obtained and reviewed including: Chart review including previous notes, labs, imaging, consultation notes including primary care documentation, prior ED visits, prior labs and imaging   Lab Tests: -I ordered, reviewed, and interpreted labs.   The pertinent results include:   Labs Reviewed  BASIC METABOLIC PANEL - Abnormal; Notable for the following components:      Result Value   Sodium 133 (*)    Glucose, Bld 100 (*)    All other components within normal limits  CBC WITH DIFFERENTIAL/PLATELET  HCG, SERUM, QUALITATIVE    Notable for reviewed, these are stable  EKG   EKG Interpretation  Date/Time:    Ventricular Rate:    PR Interval:    QRS Duration:   QT Interval:    QTC Calculation:   R Axis:     Text Interpretation:           Imaging Studies ordered: I ordered imaging studies including CT temporal with contrast I independently visualized the following imaging with scope of interpretation limited to determining acute life threatening conditions related to emergency care; findings noted above, significant for *** I independently visualized and interpreted imaging. I agree with the radiologist interpretation   Medicines ordered and prescription drug management: Meds ordered this encounter  Medications   sodium chloride 0.9 % bolus 1,000 mL   acetaminophen (TYLENOL) tablet 1,000 mg   ibuprofen (ADVIL) tablet 800 mg   iohexol (OMNIPAQUE) 300 MG/ML solution 75 mL    -I have reviewed the patients home medicines and have made adjustments as needed   Consultations Obtained: I requested consultation with the ***,  and discussed lab and imaging findings as well as pertinent plan - they  recommend: ***   Cardiac Monitoring: The patient was maintained on a cardiac monitor.  I personally viewed and interpreted the cardiac monitored which showed an underlying rhythm of: NSR  Social Determinants of Health:  Diagnosis or treatment significantly limited by social determinants of health: former smoker and obesity thc use   Reevaluation: After the interventions noted above, I reevaluated the patient and  found that they have improved  Co morbidities that complicate the patient evaluation  Past Medical History:  Diagnosis Date   Diabetes mellitus without complication    Diabetic neuropathy    Diarrhea    High cholesterol    Irregular periods       Dispostion: Disposition decision including need for hospitalization was considered, and patient {wsdispo:28070::"discharged from emergency department."}    Final Clinical Impression(s) / ED Diagnoses Final diagnoses:  None     This chart was dictated using voice recognition software.  Despite best efforts to proofread,  errors can occur which can change the documentation meaning.

## 2022-06-12 NOTE — ED Notes (Signed)
Pt seemed upset when RN entered, stated she was feeling anxious and had a lot going on and was concerned about all the tests and IV, emotional support provided, pt felt reassured, environment adjusted for comfort

## 2022-06-12 NOTE — ED Triage Notes (Signed)
Pt reports swelling and drainage to a knot on LT side of ear. Large swollen area that is not draining. Pt reports pus like drainage this morning and has noticed bleeding in the past. Pt reports "sores that burn" to inside of LT ear cannal and behind ear. Pt reports she doesn't feel good. No nausea, vom, diarrhea. Hx of diabetes.

## 2022-06-12 NOTE — Discharge Instructions (Signed)
Please follow up with ear, nose, and throat specialist as listed above.  It was a pleasure caring for you today in the emergency department.  Please return to the emergency department for any worsening or worrisome symptoms.

## 2022-06-13 ENCOUNTER — Ambulatory Visit: Payer: BLUE CROSS/BLUE SHIELD | Admitting: Family Medicine

## 2022-06-13 ENCOUNTER — Encounter: Payer: BLUE CROSS/BLUE SHIELD | Admitting: Family Medicine

## 2022-06-13 NOTE — ED Provider Notes (Signed)
  Provider Note MRN:  [id number]  Arrival date & time: 06/13/22    ED Course and Medical Decision Making  Assumed care from Dr. Wallace Cullens at shift change.  Ear pain, preauricular nodule awaiting CT  12:30 AM update: CT revealing likely inclusion cyst, otherwise unremarkable.  On reassessment patient sitting comfortably, nontoxic, appropriate for discharge with ENT follow-up.  Procedures  Final Clinical Impressions(s) / ED Diagnoses     ICD-10-CM   1. Otitis externa of left ear, unspecified chronicity, unspecified type  H60.92       ED Discharge Orders          Ordered    ciprofloxacin-dexamethasone (CIPRODEX) OTIC suspension  2 times daily        06/12/22 2324    doxycycline (VIBRAMYCIN) 100 MG capsule  2 times daily        06/12/22 2324    ibuprofen (ADVIL) 600 MG tablet  Every 6 hours PRN        06/12/22 2324    acetaminophen (TYLENOL) 325 MG tablet  Every 6 hours PRN        06/12/22 2324              Discharge Instructions      Please follow up with ear, nose, and throat specialist as listed above.  It was a pleasure caring for you today in the emergency department.  Please return to the emergency department for any worsening or worrisome symptoms.     Elmer Sow. Pilar Plate, MD [facility] [email or web address]    Sabas Sous, MD 06/13/22 [phone]

## 2022-06-14 ENCOUNTER — Encounter (HOSPITAL_BASED_OUTPATIENT_CLINIC_OR_DEPARTMENT_OTHER): Payer: Self-pay | Admitting: Emergency Medicine

## 2022-06-29 ENCOUNTER — Other Ambulatory Visit: Payer: Self-pay

## 2022-06-29 ENCOUNTER — Encounter (HOSPITAL_BASED_OUTPATIENT_CLINIC_OR_DEPARTMENT_OTHER): Payer: Self-pay

## 2022-06-29 ENCOUNTER — Emergency Department (HOSPITAL_BASED_OUTPATIENT_CLINIC_OR_DEPARTMENT_OTHER)
Admission: EM | Admit: 2022-06-29 | Discharge: 2022-06-29 | Disposition: A | Discharge status: Home / Self Care | Payer: No Typology Code available for payment source | Attending: Emergency Medicine | Admitting: Emergency Medicine

## 2022-06-29 DIAGNOSIS — Y92410 Unspecified street and highway as the place of occurrence of the external cause: Secondary | ICD-10-CM | POA: Diagnosis not present

## 2022-06-29 DIAGNOSIS — M79673 Pain in unspecified foot: Secondary | ICD-10-CM | POA: Diagnosis present

## 2022-06-29 DIAGNOSIS — Z87891 Personal history of nicotine dependence: Secondary | ICD-10-CM | POA: Diagnosis not present

## 2022-06-29 DIAGNOSIS — E119 Type 2 diabetes mellitus without complications: Secondary | ICD-10-CM | POA: Diagnosis not present

## 2022-06-29 LAB — CBG MONITORING, ED: Glucose-Capillary: 192 mg/dL — ABNORMAL HIGH (ref 70–99)

## 2022-06-29 MED ORDER — HYDROXYZINE HCL 25 MG PO TABS
50.0000 mg | ORAL_TABLET | Freq: Once | ORAL | Status: AC
  Administered 2022-06-29: 50 mg via ORAL
  Filled 2022-06-29: qty 2

## 2022-06-29 MED ORDER — NAPROXEN 250 MG PO TABS
500.0000 mg | ORAL_TABLET | Freq: Once | ORAL | Status: AC
  Administered 2022-06-29: 500 mg via ORAL
  Filled 2022-06-29: qty 2

## 2022-06-29 MED ORDER — ONDANSETRON 4 MG PO TBDP
4.0000 mg | ORAL_TABLET | Freq: Once | ORAL | Status: AC
  Administered 2022-06-29: 4 mg via ORAL
  Filled 2022-06-29: qty 1
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
## 2022-06-29 NOTE — Discharge Instructions (Signed)
You were evaluated in the Emergency Department and after careful evaluation, we did not find any emergent condition requiring admission or further testing in the hospital.  Your exam/testing today was overall reassuring.  Recommend continuing your gabapentin for nerve related pain, can try increasing the dose of it as we discussed.  Please return to the Emergency Department if you experience any worsening of your condition.  Thank you for allowing [place] to be a part of your care.
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
## 2022-07-11 ENCOUNTER — Ambulatory Visit: Payer: Self-pay | Admitting: Neurology

## 2022-08-07 DIAGNOSIS — F411 Generalized anxiety disorder: Secondary | ICD-10-CM | POA: Insufficient documentation

## 2022-08-07 DIAGNOSIS — E1142 Type 2 diabetes mellitus with diabetic polyneuropathy: Secondary | ICD-10-CM | POA: Insufficient documentation

## 2022-09-21 ENCOUNTER — Other Ambulatory Visit: Payer: Self-pay | Admitting: Family Medicine

## 2022-09-21 DIAGNOSIS — E1169 Type 2 diabetes mellitus with other specified complication: Secondary | ICD-10-CM

## 2022-10-15 ENCOUNTER — Encounter (HOSPITAL_BASED_OUTPATIENT_CLINIC_OR_DEPARTMENT_OTHER): Payer: Self-pay

## 2022-10-15 ENCOUNTER — Other Ambulatory Visit: Payer: Self-pay

## 2022-10-15 ENCOUNTER — Other Ambulatory Visit (HOSPITAL_BASED_OUTPATIENT_CLINIC_OR_DEPARTMENT_OTHER): Payer: Self-pay

## 2022-10-15 ENCOUNTER — Emergency Department (HOSPITAL_BASED_OUTPATIENT_CLINIC_OR_DEPARTMENT_OTHER)
Admission: EM | Admit: 2022-10-15 | Discharge: 2022-10-15 | Disposition: A | Discharge status: Home / Self Care | Payer: PRIVATE HEALTH INSURANCE | Attending: Emergency Medicine | Admitting: Emergency Medicine

## 2022-10-15 DIAGNOSIS — Z91040 Latex allergy status: Secondary | ICD-10-CM | POA: Insufficient documentation

## 2022-10-15 DIAGNOSIS — K529 Noninfective gastroenteritis and colitis, unspecified: Secondary | ICD-10-CM | POA: Insufficient documentation

## 2022-10-15 DIAGNOSIS — E119 Type 2 diabetes mellitus without complications: Secondary | ICD-10-CM | POA: Diagnosis not present

## 2022-10-15 DIAGNOSIS — Z794 Long term (current) use of insulin: Secondary | ICD-10-CM | POA: Diagnosis not present

## 2022-10-15 DIAGNOSIS — R112 Nausea with vomiting, unspecified: Secondary | ICD-10-CM | POA: Diagnosis present

## 2022-10-15 LAB — CBC WITH DIFFERENTIAL/PLATELET
Abs Immature Granulocytes: 0.02 10*3/uL (ref 0.00–0.07)
Basophils Absolute: 0 10*3/uL (ref 0.0–0.1)
Basophils Relative: 0 %
Eosinophils Absolute: 0.1 10*3/uL (ref 0.0–0.5)
Eosinophils Relative: 1 %
HCT: 36 % (ref 36.0–46.0)
Hemoglobin: 12.2 g/dL (ref 12.0–15.0)
Immature Granulocytes: 0 %
Lymphocytes Relative: 34 %
Lymphs Abs: 3.3 10*3/uL (ref 0.7–4.0)
MCH: 31 pg (ref 26.0–34.0)
MCHC: 33.9 g/dL (ref 30.0–36.0)
MCV: 91.4 fL (ref 80.0–100.0)
Monocytes Absolute: 0.5 10*3/uL (ref 0.1–1.0)
Monocytes Relative: 6 %
Neutro Abs: 5.8 10*3/uL (ref 1.7–7.7)
Neutrophils Relative %: 59 %
Platelets: 349 10*3/uL (ref 150–400)
RBC: 3.94 MIL/uL (ref 3.87–5.11)
RDW: 13.4 % (ref 11.5–15.5)
WBC: 9.8 10*3/uL (ref 4.0–10.5)
nRBC: 0 % (ref 0.0–0.2)

## 2022-10-15 LAB — URINALYSIS, ROUTINE W REFLEX MICROSCOPIC
Bilirubin Urine: NEGATIVE
Glucose, UA: NEGATIVE mg/dL
Hgb urine dipstick: NEGATIVE
Ketones, ur: NEGATIVE mg/dL
Leukocytes,Ua: NEGATIVE
Nitrite: NEGATIVE
Protein, ur: NEGATIVE mg/dL
Specific Gravity, Urine: >=1.03 (ref 1.005–1.030)
pH: 7 (ref 5.0–8.0)

## 2022-10-15 LAB — COMPREHENSIVE METABOLIC PANEL
ALT: 15 U/L (ref 0–44)
AST: 18 U/L (ref 15–41)
Albumin: 3.7 g/dL (ref 3.5–5.0)
Alkaline Phosphatase: 34 U/L — ABNORMAL LOW (ref 38–126)
Anion gap: 9 (ref 5–15)
BUN: 7 mg/dL (ref 6–20)
CO2: 22 mmol/L (ref 22–32)
Calcium: 8.6 mg/dL — ABNORMAL LOW (ref 8.9–10.3)
Chloride: 104 mmol/L (ref 98–111)
Creatinine, Ser: 0.86 mg/dL (ref 0.44–1.00)
GFR, Estimated: >60 mL/min (ref >60)
Glucose, Bld: 118 mg/dL — ABNORMAL HIGH (ref 70–99)
Potassium: 3.8 mmol/L (ref 3.5–5.1)
Sodium: 135 mmol/L (ref 135–145)
Total Bilirubin: 0.5 mg/dL (ref 0.3–1.2)
Total Protein: 7.2 g/dL (ref 6.5–8.1)

## 2022-10-15 LAB — LIPASE, BLOOD: Lipase: 27 U/L (ref 11–51)

## 2022-10-15 LAB — PREGNANCY, URINE: Preg Test, Ur: NEGATIVE

## 2022-10-15 MED ORDER — SODIUM CHLORIDE 0.9 % IV BOLUS
1000.0000 mL | Freq: Once | INTRAVENOUS | Status: AC
  Administered 2022-10-15: 1000 mL via INTRAVENOUS

## 2022-10-15 MED ORDER — ONDANSETRON HCL 4 MG PO TABS
4.0000 mg | ORAL_TABLET | Freq: Four times a day (QID) | ORAL | 0 refills | Status: DC
  Filled 2022-10-15: qty 12, 3d supply, fill #0

## 2022-10-15 MED ORDER — ONDANSETRON 4 MG PO TBDP
4.0000 mg | ORAL_TABLET | Freq: Once | ORAL | Status: AC
  Administered 2022-10-15: 4 mg via ORAL
  Filled 2022-10-15: qty 1

## 2022-10-15 NOTE — ED Notes (Signed)
Pt declined bloodwork at this time, reports she feels she just needs rest and a doctors note. Pt provided warm blanket.  Declined PO fluids at this time. Call bell within reach, will continue to monitor.

## 2022-10-15 NOTE — ED Triage Notes (Signed)
Pt reports eating [employer] at 2 am and woke up with N/V/D. Abdomen is cramping. Vomited x 1 and diarrhea x 2. Remains nauseated

## 2022-10-15 NOTE — Discharge Instructions (Addendum)
Please follow-up with your primary care provider with symptoms and ER visit.  Today your labs are reassuring you most likely have a GI bug causing your symptoms.  I have prescribed Zofran to help with your nausea however you may use Tylenol every 6 hours needed for pain along with Imodium over-the-counter for diarrhea.  Please remain hydratedtolerated.  If symptoms change or worsen please return to ER.

## 2022-10-15 NOTE — ED Notes (Signed)
Pt provided water, saltine crackers for PO challenge.

## 2022-10-15 NOTE — ED Notes (Signed)
D/c paperwork reviewed with pt, including prescriptions and follow up care.  All questions and/or concerns addressed at time of d/c.  No further needs expressed. . Pt verbalized understanding, Ambulatory without assistance to ED exit, NAD.   
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
## 2022-10-15 NOTE — ED Notes (Signed)
States concern for food poisoning and would rather wait for blood work once evaluated by EDP.

## 2022-11-13 ENCOUNTER — Encounter (HOSPITAL_BASED_OUTPATIENT_CLINIC_OR_DEPARTMENT_OTHER): Payer: Self-pay

## 2022-11-13 ENCOUNTER — Other Ambulatory Visit: Payer: Self-pay

## 2022-11-13 ENCOUNTER — Emergency Department (HOSPITAL_BASED_OUTPATIENT_CLINIC_OR_DEPARTMENT_OTHER)
Admission: EM | Admit: 2022-11-13 | Discharge: 2022-11-13 | Disposition: A | Discharge status: Home / Self Care | Payer: PRIVATE HEALTH INSURANCE | Attending: Emergency Medicine | Admitting: Emergency Medicine

## 2022-11-13 DIAGNOSIS — R5383 Other fatigue: Secondary | ICD-10-CM | POA: Diagnosis not present

## 2022-11-13 DIAGNOSIS — Z5900 Homelessness unspecified: Secondary | ICD-10-CM | POA: Diagnosis present

## 2022-11-13 LAB — CBG MONITORING, ED: Glucose-Capillary: 128 mg/dL — ABNORMAL HIGH (ref 70–99)
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
## 2022-11-13 NOTE — ED Provider Notes (Signed)
[place] [facility] Provider Note   CSN: [id number] Arrival date & time: 11/13/22  5284     History Chief Complaint  Patient presents with   Fatigue   Homeless    HPI Colleen Downs is a 42 y.o. female presenting for a multitude of complaints.  Primarily she is here because "I have nowhere to live". -Health insurance expired -Diabetic meds expired -Fatigue over the past 3 months. Denies any acute symptoms Patient's recorded medical, surgical, social, medication list and allergies were reviewed in the Snapshot window as part of the initial history.   Review of Systems   Review of Systems  Constitutional:  Positive for fatigue. Negative for chills and fever.  HENT:  Negative for ear pain and sore throat.   Eyes:  Negative for pain and visual disturbance.  Respiratory:  Negative for cough and shortness of breath.   Cardiovascular:  Negative for chest pain and palpitations.  Gastrointestinal:  Negative for abdominal pain and vomiting.  Genitourinary:  Negative for dysuria and hematuria.  Musculoskeletal:  Negative for arthralgias and back pain.  Skin:  Negative for color change and rash.  Neurological:  Negative for seizures and syncope.  All other systems reviewed and are negative.   Physical Exam Updated Vital Signs BP (!) 150/78   Pulse 78   Temp 99 F (37.2 C) (Oral)   Resp 16   Ht 5\' 6"  (1.676 m)   Wt 106.6 kg   LMP 11/03/2022   SpO2 97%   BMI 37.93 kg/m  Physical Exam Constitutional:      General: She is not in acute distress.    Appearance: She is obese. She is not ill-appearing or toxic-appearing.  HENT:     Head: Normocephalic and atraumatic.  Eyes:     Extraocular Movements: Extraocular movements intact.     Pupils: Pupils are equal, round, and reactive to light.  Cardiovascular:     Rate and Rhythm: Normal rate.  Pulmonary:     Effort: No respiratory distress.  Abdominal:     General: Abdomen is flat.   Musculoskeletal:        General: No swelling, deformity or signs of injury.     Cervical back: Normal range of motion. No rigidity.  Skin:    General: Skin is warm and dry.  Neurological:     General: No focal deficit present.     Mental Status: She is alert and oriented to person, place, and time.  Psychiatric:        Mood and Affect: Mood normal.      ED Course/ Medical Decision Making/ A&P    Procedures Procedures   Medications Ordered in ED Medications - No data to display  Medical Decision Making:    Colleen Downs is a 42 y.o. female who presented to the ED today with multiple concerns detailed above.    Patient with concern for elevated blood sugar.  Glucose in emergency room is only 120. She has multiple nonmedical social concerns including lack of access to social resources, homelessness, lack of access to medications. Hemodynamically she is stable on exam she is in no acute distress overall well-appearing. Inform patient that I can refer the social worker to call her during business hours but ultimately there is not much to offer from a stand-alone emergency department.  List of local resources all printed as part of her discharge paperwork but no other acute indication for intervention from the emergency room.  Clinical Impression:  1. Homelessness      Discharge   Final Clinical Impression(s) / ED Diagnoses Final diagnoses:  Homelessness    Rx / DC Orders ED Discharge Orders     None         Glyn Ade, MD 11/13/22 2104

## 2023-02-28 ENCOUNTER — Other Ambulatory Visit (HOSPITAL_COMMUNITY)
Admission: RE | Admit: 2023-02-28 | Discharge: 2023-02-28 | Disposition: A | Discharge status: Home / Self Care | Payer: No Typology Code available for payment source | Source: Ambulatory Visit | Attending: Family Medicine | Admitting: Family Medicine

## 2023-02-28 ENCOUNTER — Ambulatory Visit (INDEPENDENT_AMBULATORY_CARE_PROVIDER_SITE_OTHER): Payer: No Typology Code available for payment source | Admitting: Family Medicine

## 2023-02-28 ENCOUNTER — Encounter: Payer: Self-pay | Admitting: Family Medicine

## 2023-02-28 DIAGNOSIS — E1169 Type 2 diabetes mellitus with other specified complication: Secondary | ICD-10-CM | POA: Diagnosis not present

## 2023-02-28 DIAGNOSIS — E1142 Type 2 diabetes mellitus with diabetic polyneuropathy: Secondary | ICD-10-CM

## 2023-02-28 DIAGNOSIS — Z7985 Long-term (current) use of injectable non-insulin antidiabetic drugs: Secondary | ICD-10-CM

## 2023-02-28 DIAGNOSIS — F331 Major depressive disorder, recurrent, moderate: Secondary | ICD-10-CM

## 2023-02-28 DIAGNOSIS — Z113 Encounter for screening for infections with a predominantly sexual mode of transmission: Secondary | ICD-10-CM

## 2023-02-28 DIAGNOSIS — Z6838 Body mass index (BMI) 38.0-38.9, adult: Secondary | ICD-10-CM

## 2023-02-28 DIAGNOSIS — E782 Mixed hyperlipidemia: Secondary | ICD-10-CM | POA: Diagnosis not present

## 2023-02-28 DIAGNOSIS — Z1231 Encounter for screening mammogram for malignant neoplasm of breast: Secondary | ICD-10-CM

## 2023-02-28 DIAGNOSIS — F411 Generalized anxiety disorder: Secondary | ICD-10-CM

## 2023-02-28 DIAGNOSIS — E559 Vitamin D deficiency, unspecified: Secondary | ICD-10-CM | POA: Diagnosis not present

## 2023-02-28 DIAGNOSIS — L723 Sebaceous cyst: Secondary | ICD-10-CM

## 2023-02-28 DIAGNOSIS — E538 Deficiency of other specified B group vitamins: Secondary | ICD-10-CM | POA: Diagnosis not present

## 2023-02-28 NOTE — Assessment & Plan Note (Signed)
 Inconsistent supplementation. Labs today.

## 2023-02-28 NOTE — Assessment & Plan Note (Signed)
 Following with neurology Continue gabapentin Recheck B12 today

## 2023-02-28 NOTE — Assessment & Plan Note (Signed)
 Previously unable to afford consult. Requesting new referral.

## 2023-02-28 NOTE — Assessment & Plan Note (Signed)
 Inconsistent medication use due to recent homelessness. Reports neuropathy symptoms in both feet and arms. -Order labs  -Refill Mounjaro  prescription once labs are reviewed. -Follow-up with prior neurologist regarding neuropathy symptoms. -Refer to ophthalmology for diabetic eye exam.

## 2023-02-28 NOTE — Progress Notes (Signed)
 New Patient Office Visit  Subjective   [name], female    DOB: 24-Aug-1980  Age: 43 y.o. MRN: [id number]  CC:  follow-up, chronic disease management - refills    HPI  Patient is here to reestablish care. She recently moved back here from [place].     Discussed the use of AI scribe software for clinical note transcription with the patient, who gave verbal consent to proceed.  History of Present Illness   The patient, with a history of diabetes, neuropathy, and hypercholesterolemia, presents after a period of medication non-adherence due to loss of insurance and housing instability. They report inconsistent intake of gabapentin , vitamins, and atorvastatin . They have not taken their diabetes medication, Mounjaro , for several months.  The patient's neuropathy symptoms have significantly worsened, particularly in the right foot, described as 'fireballs.' They also report neuropathic symptoms in their arms. The patient has been taking gabapentin  more consistently than other medications, at a dose of 600mg  at bedtime. She hasn't seen her neurologist in a year due to loss of insurance.   The patient also reports potential menopausal symptoms, including night sweats and hot flashes, as well as change to menses with alternating heavy and light flow, but still occurring monthly. They express a desire for therapy to address ongoing depression/anxiety, but have been hesitant to start medication due to fear of side effects.  The patient has recently secured housing and employment, which has led to an improvement in their overall situation. They have also regained insurance coverage and are seeking to reestablish care and medication management. They express a desire for comprehensive testing, including HIV and STD testing, as well as a mammogram.         DIABETES: - Checking BG at home: no - Medications: none - previously on Mounjaro , but has been out for months due to financial  reasons - Compliance: she has not been on any meds recently due to supply issues - Diet: low carb, mostly vegetarian - Exercise: minimal - Eye exam: referral placed last visit  - Foot exam: today  - Microalbumin: UTD - Denies symptoms of hypoglycemia, polyuria, polydipsia, foot ulcers/trauma, wounds that are not healing, medication side effects  - She is willing to try Mounjaro  if her insurance will cover and hopefully she will not run into supply issues Lab Results  Component Value Date   HGBA1C 7.5 (H) 02/02/2022     HYPERLIPIDEMIA - medications: atorvastatin  10 mg daily - compliance: inconsistent, financial concerns - medication SEs: none The 10-year ASCVD risk score (Arnett DK, et al., 2019) is: 2.2%   Values used to calculate the score:     Age: 13 years     Sex: Female     Is Non-Hispanic African American: Yes     Diabetic: Yes     Tobacco smoker: No     Systolic Blood Pressure: 125 mmHg     Is BP treated: No     HDL Cholesterol: 44 mg/dL     Total Cholesterol: 175 mg/dL   Vitamin D  Deficiency: - Oral supplementation 1,000 units daily - inconsistent.   Vitamin B12 deficiency: - Neurology has recommended she start B12 1,000 mcg daily. She is unsure what she has been taking, nothing consistently   STD screening: - Reports recent partners not using protection. - She would like routine screening. She has not noticed any symptoms.        02/28/2023    2:33 PM 09/13/2021    1:48 PM 07/13/2021  1:02 PM  PHQ9 SCORE ONLY  PHQ-9 Total Score 4 16 15       02/28/2023    2:34 PM 02/05/2022    1:48 PM 09/13/2021    1:48 PM 07/13/2021    1:02 PM  GAD 7 : Generalized Anxiety Score  Nervous, Anxious, on Edge 1 0 1 3  Control/stop worrying 1 1 2 3   Worry too much - different things 1 1 2 3   Trouble relaxing 1 0 1 1  Restless 0 0 0 0  Easily annoyed or irritable 1 1 2 3   Afraid - awful might happen 1 0 2 2  Total GAD 7 Score 6 3 10 15   Anxiety Difficulty Somewhat  difficult Not difficult at all Somewhat difficult        ROS All review of systems negative except what is listed in the HPI    Objective:     BP 125/71   Pulse 60   Ht 5' 6 (1.676 m)   Wt 240 lb (108.9 kg)   SpO2 99%   BMI 38.74 kg/m    Physical Exam Vitals reviewed.  Constitutional:      General: She is not in acute distress.    Appearance: Normal appearance. She is obese. She is not ill-appearing.  HENT:     Ears:      Comments: Left ear, sebaceous cyst Cardiovascular:     Rate and Rhythm: Normal rate and regular rhythm.  Pulmonary:     Effort: Pulmonary effort is normal.     Breath sounds: Normal breath sounds.  Musculoskeletal:        General: Normal range of motion.     Cervical back: Normal range of motion and neck supple.     Right lower leg: No edema.     Left lower leg: No edema.  Skin:    General: Skin is warm and dry.  Neurological:     General: No focal deficit present.     Mental Status: She is alert and oriented to person, place, and time. Mental status is at baseline.  Psychiatric:        Mood and Affect: Mood normal.        Behavior: Behavior normal.        Thought Content: Thought content normal.        Judgment: Judgment normal.      No results found for any visits on 02/28/23.     The 10-year ASCVD risk score (Arnett DK, et al., 2019) is: 2.2%    Assessment & Plan:   Problem List Items Addressed This Visit       Active Problems   Type 2 diabetes mellitus with morbid obesity (HCC) - Primary   Inconsistent medication use due to recent homelessness. Reports neuropathy symptoms in both feet and arms. -Order labs  -Refill Mounjaro  prescription once labs are reviewed. -Follow-up with prior neurologist regarding neuropathy symptoms. -Refer to ophthalmology for diabetic eye exam.      Relevant Orders   Comprehensive metabolic panel   Hemoglobin A1c   Lipid panel   Microalbumin / creatinine urine ratio   Ambulatory referral  to Ophthalmology   Vitamin D  deficiency   Inconsistent supplementation. Labs today.        Relevant Orders   Comprehensive metabolic panel   VITAMIN D  25 Hydroxy (Vit-D Deficiency, Fractures)   Mixed hyperlipidemia   Inconsistent use of Atorvastatin  due to recent homelessness. -Continue Lipitor and lifestyle measures  Major depression   Reports recent stressors and desire for therapy. No SI/HI -Refer to counseling services. Not interested in medications at this time.       Relevant Orders   Ambulatory referral to Behavioral Health   Sebaceous cyst   Previously unable to afford consult. Requesting new referral.       Relevant Orders   Ambulatory referral to General Surgery   B12 deficiency   Inconsistent supplementation. Labs today       Relevant Orders   Vitamin B12   DM type 2 with diabetic peripheral neuropathy (HCC)   Following with neurology Continue gabapentin  Recheck B12 today       GAD (generalized anxiety disorder)   See above (depression)      Relevant Orders   Ambulatory referral to Behavioral Health   Other Visit Diagnoses       Encounter for screening mammogram for malignant neoplasm of breast       Relevant Orders   MM DIGITAL SCREENING BILATERAL     Screen for STD (sexually transmitted disease)       Relevant Orders   HIV Antibody (routine testing w rflx)   RPR   Cervicovaginal ancillary only   Ct/GC NAA, Pharyngeal           Return in about 3 months (around 05/29/2023) for routine follow-up.     Waddell KATHEE Mon, NP

## 2023-02-28 NOTE — Assessment & Plan Note (Signed)
 Inconsistent use of Atorvastatin due to recent homelessness. -Continue Lipitor and lifestyle measures

## 2023-02-28 NOTE — Assessment & Plan Note (Signed)
 Reports recent stressors and desire for therapy. No SI/HI -Refer to counseling services. Not interested in medications at this time.

## 2023-02-28 NOTE — Assessment & Plan Note (Signed)
See above - depression

## 2023-03-01 ENCOUNTER — Telehealth: Payer: Self-pay | Admitting: Family Medicine

## 2023-03-01 LAB — COMPREHENSIVE METABOLIC PANEL
ALT: 16 U/L (ref 0–35)
AST: 16 U/L (ref 0–37)
Albumin: 4.1 g/dL (ref 3.5–5.2)
Alkaline Phosphatase: 38 U/L — ABNORMAL LOW (ref 39–117)
BUN: 9 mg/dL (ref 6–23)
CO2: 26 meq/L (ref 19–32)
Calcium: 9.4 mg/dL (ref 8.4–10.5)
Chloride: 102 meq/L (ref 96–112)
Creatinine, Ser: 0.79 mg/dL (ref 0.40–1.20)
GFR: 92.37 mL/min (ref >60.00)
Glucose, Bld: 131 mg/dL — ABNORMAL HIGH (ref 70–99)
Potassium: 4.3 meq/L (ref 3.5–5.1)
Sodium: 137 meq/L (ref 135–145)
Total Bilirubin: 0.3 mg/dL (ref 0.2–1.2)
Total Protein: 7.2 g/dL (ref 6.0–8.3)

## 2023-03-01 LAB — LIPID PANEL
Cholesterol: 211 mg/dL — ABNORMAL HIGH (ref 0–200)
HDL: 54.9 mg/dL (ref >39.00)
LDL Cholesterol: 125 mg/dL — ABNORMAL HIGH (ref 0–99)
NonHDL: 156.28
Total CHOL/HDL Ratio: 4
Triglycerides: 158 mg/dL — ABNORMAL HIGH (ref 0.0–149.0)
VLDL: 31.6 mg/dL (ref 0.0–40.0)

## 2023-03-01 LAB — HEMOGLOBIN A1C: Hgb A1c MFr Bld: 8.2 % — ABNORMAL HIGH (ref 4.6–6.5)

## 2023-03-01 LAB — VITAMIN D 25 HYDROXY (VIT D DEFICIENCY, FRACTURES): VITD: 15.33 ng/mL — ABNORMAL LOW (ref 30.00–100.00)

## 2023-03-01 LAB — VITAMIN B12: Vitamin B-12: 332 pg/mL (ref 211–911)

## 2023-03-01 LAB — MICROALBUMIN / CREATININE URINE RATIO
Creatinine,U: 212.1 mg/dL
Microalb Creat Ratio: 0.3 mg/g (ref 0.0–30.0)
Microalb, Ur: <0.7 mg/dL (ref 0.0–1.9)

## 2023-03-01 NOTE — Telephone Encounter (Signed)
 This was sent to [facility], can you forward to a different office?

## 2023-03-01 NOTE — Telephone Encounter (Signed)
 Patient is calling back in stating that the office where her referral for surgery for removal of a cyst on pt ear was not accepted because they don't take her insurance and pt stated she was told the dr needed to find her someone elsa to go to and they where rude to the patient she need's someone who accepts her insurance

## 2023-03-02 LAB — HIV ANTIBODY (ROUTINE TESTING W REFLEX): HIV 1&2 Ab, 4th Generation: NONREACTIVE

## 2023-03-02 LAB — CT/GC NAA, PHARYNGEAL
C TRACH RRNA NPH QL PCR: NEGATIVE
N GONORRHOEA RRNA NPH QL PCR: NEGATIVE

## 2023-03-02 LAB — RPR: RPR Ser Ql: NONREACTIVE

## 2023-03-04 ENCOUNTER — Telehealth: Payer: Self-pay | Admitting: Family Medicine

## 2023-03-04 ENCOUNTER — Other Ambulatory Visit: Payer: Self-pay | Admitting: Family Medicine

## 2023-03-04 DIAGNOSIS — E538 Deficiency of other specified B group vitamins: Secondary | ICD-10-CM

## 2023-03-04 DIAGNOSIS — E559 Vitamin D deficiency, unspecified: Secondary | ICD-10-CM

## 2023-03-04 LAB — CERVICOVAGINAL ANCILLARY ONLY
Bacterial Vaginitis (gardnerella): NEGATIVE
Candida Glabrata: NEGATIVE
Candida Vaginitis: NEGATIVE
Chlamydia: NEGATIVE
Comment: NEGATIVE
Comment: NEGATIVE
Comment: NEGATIVE
Comment: NEGATIVE
Comment: NEGATIVE
Comment: NORMAL
Neisseria Gonorrhea: NEGATIVE
Trichomonas: NEGATIVE

## 2023-03-04 MED ORDER — VITAMIN D3 25 MCG (1000 UT) PO CAPS
1000.0000 [IU] | ORAL_CAPSULE | Freq: Every day | ORAL | 3 refills | Status: DC
Start: 2023-03-04 — End: 2023-12-19

## 2023-03-04 MED ORDER — VITAMIN D (ERGOCALCIFEROL) 1.25 MG (50000 UNIT) PO CAPS
50000.0000 [IU] | ORAL_CAPSULE | ORAL | 0 refills | Status: DC
Start: 2023-03-04 — End: 2023-05-09

## 2023-03-04 MED ORDER — VITAMIN B-12 1000 MCG PO TABS
1000.0000 ug | ORAL_TABLET | Freq: Every day | ORAL | 1 refills | Status: DC
Start: 2023-03-04 — End: 2023-03-13

## 2023-03-04 MED ORDER — MOUNJARO 2.5 MG/0.5ML ~~LOC~~ SOAJ: 2.5000 mg | SUBCUTANEOUS | 1 refills | Status: DC

## 2023-03-04 NOTE — Telephone Encounter (Signed)
 Copied from CRM [id number]. Topic: Referral - Status >> Mar 04, 2023  4:07 PM Suzette B wrote: Reason for CRM: sebaceous cyst referral, patient insurance is not accepted this will be self pay and she needed to know if the provider could call patient to advise of the self pay option or if need be Ms. Alecia of [facility] stated she would call but she would like to speak to someone in the office.

## 2023-03-04 NOTE — Progress Notes (Signed)
 A1c has gone up a bit, but you've been out of meds for awhile. Let's get you restarted on Mounjaro  as that was helping.  Cholesterol is up a bit as well. Continue Lipitor and healthy lifestyle measures.   Vitamin D  is down again. Sending in high-dose weekly supplement also restart 1,000 units of D3 over-the-counter daily.  B12 is on low end of normal. Restart you B12 supplement.   STD screening is negative

## 2023-03-05 ENCOUNTER — Telehealth: Payer: Self-pay

## 2023-03-05 ENCOUNTER — Telehealth: Payer: Self-pay | Admitting: Family Medicine

## 2023-03-05 ENCOUNTER — Ambulatory Visit: Payer: Self-pay | Admitting: Family Medicine

## 2023-03-05 NOTE — Telephone Encounter (Signed)
 See other note- PA has been started

## 2023-03-05 NOTE — Telephone Encounter (Signed)
 Patient called in reporting that she is trying to fill her prescription for Mounjaro  to manage her diabetes symptoms. Patient stated she is having a hard time with her insurance company and they need prior authorization from her PCP in order to fill this prescription. Called CAL and was instructed to route CRM to clinical pool to be handled. Routing conversation, per CAL. Patient also requested a refill of Fluconazole  for a yeast infection. Advised patient that I would route conversation to clinic. Advised patient to call back in 2-3 days if issue is not resolve.   Copied from CRM [id number]. Topic: Clinical - Red Word Triage >> Mar 05, 2023  1:47 PM Macario HERO wrote: Red Word that prompted transfer to Nurse Triage: Patient stated she does not feel well. She has diabetes and has swelling in her feet due to no meds for awhile. Reason for Disposition  General information question, no triage required and triager able to answer question  Answer Assessment - Initial Assessment Questions 1. REASON FOR CALL or QUESTION: What is your reason for calling today? or How can I best help you? or What question do you have that I can help answer?     Patient is calling because she is trying to fill her prescription for Mounjaro  and prior authorization is needed. Patient is also requesting a refill of Fluconazole .  Protocols used: Information Only Call - No Triage-A-AH

## 2023-03-05 NOTE — Telephone Encounter (Signed)
 Called patient and LVM letting her know she will need to call her insurance directly to find out who is covered for general surgery. To call with any questions. Can relay message if she calls back.

## 2023-03-05 NOTE — Telephone Encounter (Signed)
 PA initiated via Covermymeds; KEY [id number]. Awaiting determination.

## 2023-03-05 NOTE — Telephone Encounter (Signed)
 Copied from CRM [id number]. Topic: Clinical - Prescription Issue >> Mar 05, 2023  1:33 PM Macario HERO wrote: Reason for CRM: Patient called stated that her insurance said she needs the doctor to send over an authorization for medication MOUNJARO . Call: [phone] Fax: [phone]

## 2023-03-05 NOTE — Telephone Encounter (Signed)
 Multiple open encounters regarding this. See previous open encounter.

## 2023-03-05 NOTE — Telephone Encounter (Signed)
 Copied from CRM [id number]. Topic: Referral - Request for Referral >> Mar 05, 2023  1:36 PM Macario HERO wrote: Did the patient discuss referral with their provider in the last year? Yes (If No - schedule appointment) (If Yes - send message)  Appointment offered? Yes  Type of order/referral and detailed reason for visit: Cyst on the side of head that needs to be removed, stated that it is currently leaking.  Preference of office, provider, location: [facility] [phone]   If referral order, have you been seen by this specialty before? No (If Yes, this issue or another issue? When? Where?  Can we respond through MyChart? Yes

## 2023-03-05 NOTE — Telephone Encounter (Signed)
 PA approved.   Approved. MOUNJARO 2.5MG /0.5ML Soln Auto-inj is approved from 03/05/2023 to 03/04/2024. All strengths of the drug are approved. Authorization Expiration Date: 03/04/2024

## 2023-03-05 NOTE — Telephone Encounter (Signed)
 Open encounter regarding this. Message copied.

## 2023-03-05 NOTE — Telephone Encounter (Signed)
 No phone number listed as contact for Alecia. I am assuming this is the second referral?   Copied from CRM [id number]. Topic: Referral - Status >> Mar 04, 2023  4:07 PM Suzette B wrote: Reason for CRM: sebaceous cyst referral, patient insurance is not accepted this will be self pay and she needed to know if the provider could call patient to advise of the self pay option or if need be Ms. Alecia of [facility] stated she would call but she would like to speak to someone in the office.

## 2023-03-07 ENCOUNTER — Telehealth: Payer: Self-pay | Admitting: *Deleted

## 2023-03-07 ENCOUNTER — Telehealth: Payer: Self-pay | Admitting: Family Medicine

## 2023-03-07 DIAGNOSIS — L723 Sebaceous cyst: Secondary | ICD-10-CM

## 2023-03-07 NOTE — Telephone Encounter (Signed)
 Okay to send to Plastics? Pended.

## 2023-03-07 NOTE — Telephone Encounter (Signed)
 Colleen Downs

## 2023-03-07 NOTE — Telephone Encounter (Signed)
 Copied from CRM [id number]. Topic: Referral - Status >> Mar 07, 2023 11:00 AM Curlee DEL wrote: Reason for CRM: [facility] called the patient to confirm the location of the Cysts - they were advised that there was one behind the ear and one on the face. They are calling to inform the office that they do not do removal of cysts on the face and whether they would like the referral forwarded to plastics.

## 2023-03-07 NOTE — Telephone Encounter (Signed)
 Colleen Downs made aware new referral placed to plastics.
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
## 2023-03-13 ENCOUNTER — Ambulatory Visit (INDEPENDENT_AMBULATORY_CARE_PROVIDER_SITE_OTHER): Payer: No Typology Code available for payment source | Admitting: Neurology

## 2023-03-13 ENCOUNTER — Encounter: Payer: Self-pay | Admitting: Plastic Surgery

## 2023-03-13 ENCOUNTER — Ambulatory Visit (INDEPENDENT_AMBULATORY_CARE_PROVIDER_SITE_OTHER): Payer: No Typology Code available for payment source | Admitting: Plastic Surgery

## 2023-03-13 ENCOUNTER — Encounter: Payer: Self-pay | Admitting: Neurology

## 2023-03-13 VITALS — BP 157/78 | HR 69 | Ht 66.0 in | Wt 241.0 lb

## 2023-03-13 DIAGNOSIS — M79672 Pain in left foot: Secondary | ICD-10-CM

## 2023-03-13 DIAGNOSIS — E538 Deficiency of other specified B group vitamins: Secondary | ICD-10-CM | POA: Diagnosis not present

## 2023-03-13 DIAGNOSIS — M79671 Pain in right foot: Secondary | ICD-10-CM | POA: Diagnosis not present

## 2023-03-13 DIAGNOSIS — R200 Anesthesia of skin: Secondary | ICD-10-CM

## 2023-03-13 DIAGNOSIS — R220 Localized swelling, mass and lump, head: Secondary | ICD-10-CM

## 2023-03-13 DIAGNOSIS — R202 Paresthesia of skin: Secondary | ICD-10-CM

## 2023-03-13 DIAGNOSIS — E1142 Type 2 diabetes mellitus with diabetic polyneuropathy: Secondary | ICD-10-CM

## 2023-03-13 DIAGNOSIS — D489 Neoplasm of uncertain behavior, unspecified: Secondary | ICD-10-CM

## 2023-03-13 DIAGNOSIS — E1140 Type 2 diabetes mellitus with diabetic neuropathy, unspecified: Secondary | ICD-10-CM

## 2023-03-13 MED ORDER — GABAPENTIN 400 MG PO CAPS: 800.0000 mg | ORAL_CAPSULE | Freq: Every day | ORAL | 5 refills | Status: DC

## 2023-03-13 MED ORDER — VITAMIN B-12 1000 MCG PO TABS
1000.0000 ug | ORAL_TABLET | Freq: Every day | ORAL | 1 refills | Status: DC
Start: 2023-03-13 — End: 2023-10-07

## 2023-03-13 NOTE — Patient Instructions (Addendum)
 We will reorder the nerve and muscle test called EMG (see more information below).  We will increase your gabapentin  to 800 mg at bedtime. I sent a new prescription to your pharmacy.  Take B12 1000 mcg daily. I sent this to your pharmacy. You can also buy this over the counter if your insurance doesn't cover it.  Take the vitamin D  supplementation as prescribed.  Return to clinic as scheduled for 07/05/23 at 9 am.  Please let me know if you have any questions or concerns in the meantime.  The physicians and staff at [facility] are committed to providing excellent care. You may receive a survey requesting feedback about your experience at our office. We strive to receive "very good" responses to the survey questions. If you feel that your experience would prevent you from giving the office a "very good " response, please contact our office to try to remedy the situation. We may be reached at [phone]. Thank you for taking the time out of your busy day to complete the survey.  Rommie Coats, MD [facility]  ELECTROMYOGRAM AND NERVE CONDUCTION STUDIES (EMG/NCS) INSTRUCTIONS  How to Prepare The neurologist conducting the EMG will need to know if you have certain medical conditions. Tell the neurologist and other EMG lab personnel if you: Have a pacemaker or any other electrical medical device Take blood-thinning medications Have hemophilia, a blood-clotting disorder that causes prolonged bleeding Bathing Take a shower or bath shortly before your exam in order to remove oils from your skin. Don't apply lotions or creams before the exam.  What to Expect You'll likely be asked to change into a hospital gown for the procedure and lie down on an examination table. The following explanations can help you understand what will happen during the exam.  Electrodes. The neurologist or a technician places surface electrodes at various locations on your skin depending on where you're  experiencing symptoms. Or the neurologist may insert needle electrodes at different sites depending on your symptoms.  Sensations. The electrodes will at times transmit a tiny electrical current that you may feel as a twinge or spasm. The needle electrode may cause discomfort or pain that usually ends shortly after the needle is removed. If you are concerned about discomfort or pain, you may want to talk to the neurologist about taking a short break during the exam.  Instructions. During the needle EMG, the neurologist will assess whether there is any spontaneous electrical activity when the muscle is at rest - activity that isn't present in healthy muscle tissue - and the degree of activity when you slightly contract the muscle.  He or she will give you instructions on resting and contracting a muscle at appropriate times. Depending on what muscles and nerves the neurologist is examining, he or she may ask you to change positions during the exam.  After your EMG You may experience some temporary, minor bruising where the needle electrode was inserted into your muscle. This bruising should fade within several days. If it persists, contact your primary care doctor.

## 2023-03-13 NOTE — Progress Notes (Signed)
 Referring Provider Everlina Hock, NP [address] [place],  [place] [address]   CC:  Chief Complaint  Patient presents with   Advice Only      Colleen Downs is an 43 y.o. female.  HPI: Colleen Downs is a 43 year old female who presents today with complaint of a swelling or cyst in the preauricular region of the left ear.  Patient states this has been there for many years and is occasionally uncomfortable if she sleeps on it.  She also notes that she occasionally has bloody drainage from the area.  To have it removed.  She also notes that there is a small skin lesion behind her left ear as well.  Allergies  Allergen Reactions   Sulfa Drugs Cross Reactors Anaphylaxis   Latex     Outpatient Encounter Medications as of 03/13/2023  Medication Sig   atorvastatin  (LIPITOR) 10 MG tablet Take 1 tablet (10 mg total) by mouth daily.   Cholecalciferol (VITAMIN D3) 25 MCG (1000 UT) CAPS Take 1 capsule (1,000 Units total) by mouth daily.   cyanocobalamin  (VITAMIN B12) 1000 MCG tablet Take 1 tablet (1,000 mcg total) by mouth daily.   gabapentin  (NEURONTIN ) 100 MG capsule Take 6 capsules (600 mg total) by mouth at bedtime.   tirzepatide  (MOUNJARO ) 2.5 MG/0.5ML Pen Inject 2.5 mg into the skin once a week.   Vitamin D , Ergocalciferol , (DRISDOL ) 1.25 MG (50000 UNIT) CAPS capsule Take 1 capsule (50,000 Units total) by mouth every 7 (seven) days.   [DISCONTINUED] famotidine  (PEPCID ) 20 MG tablet Take 1 tablet (20 mg total) by mouth 2 (two) times daily. (Patient not taking: Reported on 05/17/2020)   No facility-administered encounter medications on file as of 03/13/2023.     Past Medical History:  Diagnosis Date   Diabetes mellitus without complication (HCC)    Diabetic neuropathy (HCC)    Diarrhea    High cholesterol    Irregular periods    Severe acute respiratory syndrome coronavirus 2 (SARS-CoV-2) vaccination declined 09/03/2019   IMO SNOMED Dx Update Oct 2024      Past  Surgical History:  Procedure Laterality Date   CHOLECYSTECTOMY      Family History  Problem Relation Age of Onset   Rheum arthritis Mother    Rheum arthritis Sister    Arthritis Maternal Uncle    Arthritis Paternal Uncle    Diabetes Maternal Grandmother     Social History   Social History Narrative   Are you right handed or left handed? Right   Are you currently employed ?    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 nieces   Who lives with you?    What type of home do you live in: 1 story or 2 story? two    Caffeine rarely 1 or 2 a week     Review of Systems General: Denies fevers, chills, weight loss CV: Denies chest pain, shortness of breath, palpitations Skin: 1.5 cm mass in the preauricular region that occasionally drains blood and is occasionally painful  Physical Exam    03/13/2023    1:13 PM 02/28/2023    2:02 PM 11/13/2022    7:38 PM  Vitals with BMI  Height 5\' 6"  5\' 6"    Weight 241 lbs 240 lbs   BMI 38.92 38.76   Systolic 157 125 102  Diastolic 78 71 78  Pulse 69 60 78    General:  No  acute distress,  Alert and oriented, Non-Toxic, Normal speech and affect Integument: There is a small cyst in the preauricular region on the left ear.  The cyst is soft and somewhat mobile.  I do not see a punctum consistent with a sebaceous cyst over the lesion. Mammogram: No mammograms recorded.  When patient returns next time of she has not scheduled a mammogram through her primary care provider will offer to order 1. Assessment/Plan Subcutaneous mass, left preauricular region: This is most likely an epidermal inclusion cyst however the presentation and location are somewhat unusual.  Will order an ultrasound to evaluate prior to scheduling for surgery.  Once the ultrasound is complete we will schedule her for removal in the operating room.  Return after the ultrasound is done  30 minutes spent reviewing chart, examining patient, discussing  treatment plan, coordinating care and documenting.  Teretha Ferguson 03/13/2023, 2:08 PM

## 2023-03-14 ENCOUNTER — Inpatient Hospital Stay (HOSPITAL_BASED_OUTPATIENT_CLINIC_OR_DEPARTMENT_OTHER): Admission: RE | Admit: 2023-03-14 | Payer: No Typology Code available for payment source | Source: Ambulatory Visit

## 2023-03-19 ENCOUNTER — Encounter: Payer: Self-pay | Admitting: Family Medicine

## 2023-03-19 ENCOUNTER — Ambulatory Visit (INDEPENDENT_AMBULATORY_CARE_PROVIDER_SITE_OTHER): Payer: No Typology Code available for payment source | Admitting: Family Medicine

## 2023-03-19 VITALS — BP 125/66 | HR 66 | Temp 98.0°F | Ht 66.0 in | Wt 241.0 lb

## 2023-03-19 DIAGNOSIS — R300 Dysuria: Secondary | ICD-10-CM | POA: Diagnosis not present

## 2023-03-19 LAB — POC URINALSYSI DIPSTICK (AUTOMATED)
Bilirubin, UA: NEGATIVE
Blood, UA: NEGATIVE
Glucose, UA: NEGATIVE
Ketones, UA: NEGATIVE
Nitrite, UA: NEGATIVE
Protein, UA: NEGATIVE
Spec Grav, UA: 1.025 (ref 1.010–1.025)
Urobilinogen, UA: 0.2 U/dL
pH, UA: 6 (ref 5.0–8.0)

## 2023-03-19 MED ORDER — CEPHALEXIN 500 MG PO CAPS
500.0000 mg | ORAL_CAPSULE | Freq: Two times a day (BID) | ORAL | 0 refills | Status: AC
Start: 2023-03-19 — End: 2023-03-26

## 2023-03-19 MED ORDER — PHENAZOPYRIDINE HCL 200 MG PO TABS
200.0000 mg | ORAL_TABLET | Freq: Three times a day (TID) | ORAL | 0 refills | Status: DC | PRN
Start: 2023-03-19 — End: 2023-04-29

## 2023-03-19 MED ORDER — FLUCONAZOLE 150 MG PO TABS
150.0000 mg | ORAL_TABLET | Freq: Every day | ORAL | 0 refills | Status: DC
Start: 2023-03-19 — End: 2023-06-19

## 2023-03-19 NOTE — Progress Notes (Signed)
Acute Office Visit  Subjective:     [name], female    DOB: [birth year], 43 y.o.   MRN: [id number]  Chief Complaint  Patient presents with   Dysuria    Dysuria    Patient is in today for dysuria.   Discussed the use of AI scribe software for clinical note transcription with the patient, who gave verbal consent to proceed.  History of Present Illness   The patient presents with bilateral low back pain, more severe on the right, and dysuria. She describes the pain as sharp intermittently. The pain has been ongoing for a couple of weeks and was initially managed at a [facility] clinic with an antibiotic and Azo pills. Despite this treatment, the pain persisted. The patient also reports urinary frequency, urgency, and strain, with a sensation of incomplete voiding. She denies fever and hematuria. The patient has a daily habit of drinking Coke, which she has tried to limit since the onset of symptoms. She also reports antibiotics tend to trigger yeast infections.               All review of systems negative except what is listed in the HPI      Objective:    BP 125/66   Pulse 66   Temp 98 F (36.7 C) (Oral)   Ht 5\' 6"  (1.676 m)   Wt 241 lb (109.3 kg)   SpO2 100%   BMI 38.90 kg/m    Physical Exam Vitals reviewed.  Constitutional:      Appearance: Normal appearance. She is obese.  Cardiovascular:     Rate and Rhythm: Normal rate and regular rhythm.  Pulmonary:     Effort: Pulmonary effort is normal.     Breath sounds: Normal breath sounds.  Abdominal:     Tenderness: There is no abdominal tenderness. There is no right CVA tenderness, left CVA tenderness, guarding or rebound.  Skin:    General: Skin is warm and dry.  Neurological:     Mental Status: She is alert and oriented to person, place, and time.  Psychiatric:        Mood and Affect: Mood normal.        Behavior: Behavior normal.        Thought Content: Thought content normal.         Judgment: Judgment normal.       Results for orders placed or performed in visit on 03/19/23  POCT Urinalysis Dipstick (Automated)  Result Value Ref Range   Color, UA yellow    Clarity, UA clear    Glucose, UA Negative Negative   Bilirubin, UA negative    Ketones, UA negative    Spec Grav, UA 1.025 1.010 - 1.025   Blood, UA negative    pH, UA 6.0 5.0 - 8.0   Protein, UA Negative Negative   Urobilinogen, UA 0.2 0.2 or 1.0 E.U./dL   Nitrite, UA negative    Leukocytes, UA Large (3+) (A) Negative        Assessment & Plan:   Problem List Items Addressed This Visit   None Visit Diagnoses       Dysuria    -  Primary   Relevant Medications   cephALEXin (KEFLEX) 500 MG capsule   phenazopyridine (PYRIDIUM) 200 MG tablet   fluconazole (DIFLUCAN) 150 MG tablet   Other Relevant Orders   POCT Urinalysis Dipstick (Automated) (Completed)   Urine Culture      Reports of bilateral flank pain,  dysuria, urinary frequency, and urgency. No fever or hematuria. Previous treatment with Macrobid from an urgent care center a few weeks ago did not resolve symptoms. Urinalysis shows leukocytes, no blood. No CVA tenderness on exam. -Send urine for culture and sensitivity. -Start Keflex and adjust based on culture results. -Provide Pyridium for symptomatic relief. -Advise patient to seek emergency care if symptoms worsen significantly or if hematuria develops. -Provide Diflucan prophylactically to use if symptoms of a yeast infection develop. -Advise to increase water intake and reduce consumption of sugar and caffeine to prevent UTI recurrence.              Meds ordered this encounter  Medications   cephALEXin (KEFLEX) 500 MG capsule    Sig: Take 1 capsule (500 mg total) by mouth 2 (two) times daily for 7 days.    Dispense:  14 capsule    Refill:  0    Supervising Provider:   Danise Edge A [[id number]]   phenazopyridine (PYRIDIUM) 200 MG tablet    Sig: Take 1 tablet (200 mg total) by  mouth 3 (three) times daily as needed for pain.    Dispense:  10 tablet    Refill:  0    Supervising Provider:   Danise Edge A [[id number]]   fluconazole (DIFLUCAN) 150 MG tablet    Sig: Take 1 tablet (150 mg total) by mouth daily. May repeat in 3 days if needed.    Dispense:  2 tablet    Refill:  0    Supervising Provider:   Danise Edge A [[id number]]    Return if symptoms worsen or fail to improve.  Clayborne Dana, NP

## 2023-03-20 LAB — URINE CULTURE
MICRO NUMBER:: 15981272
SPECIMEN QUALITY:: ADEQUATE

## 2023-03-21 ENCOUNTER — Encounter: Payer: Self-pay | Admitting: Family Medicine

## 2023-03-21 ENCOUNTER — Telehealth: Payer: Self-pay | Admitting: Neurology

## 2023-03-21 NOTE — Telephone Encounter (Signed)
Eye referral sent to [facility]. [facility] Address: [address], [place], [place] [address] Hours:  Open ? Closes 5?PM Phone: [phone]  Will call patient to see what other referral she is referring to that she needs sent elsewhere.      Copied from CRM [id number]. Topic: Referral - Status >> Mar 21, 2023 11:07 AM Colleen Downs wrote: Reason for CRM: Patient states that she was given a referral, but the location that the referral was sent to has no available appointments. Patient is requesting a call back to have the referral changed to somewhere else. She also states that she requested an eye referral to get a eye evaluation and nobody has contacted her in regards to that as well. Patient is requesting a call back today.

## 2023-03-21 NOTE — Telephone Encounter (Signed)
Called and LVM letting patient know eye referral information and asked that she call back and let us know which other referral she is referring to in the first part of the CRM. Awaiting call back.

## 2023-03-22 ENCOUNTER — Ambulatory Visit: Payer: Self-pay | Admitting: Family Medicine

## 2023-03-22 NOTE — Telephone Encounter (Signed)
Copied from CRM [id number]. Topic: Clinical - Red Word Triage >> Mar 22, 2023 12:06 PM Lennart Pall wrote: Red Word that prompted transfer to Nurse Triage:  Patient still has pain when she is urinating. Was told not to take the antibiotics since results came back she did not have a UTI.   Chief Complaint: painful urination Symptoms: urinary frequency Frequency: ongoing since 03/19/23 Pertinent Negatives: Patient denies fever Disposition: [] ED /[] Urgent Care (no appt availability in office) / [] Appointment(In office/virtual)/ []  [place] [facility]/ [] Home Care/ [] Refused Recommended Disposition /[] [facility]/ [x]  Follow-up with PCP Additional Notes: The patient reported being seen in the office 03/19/23 and prescribed an antibiotic for a possible urinary tract infection. Later, she received a message that her urine culture was  normal and she did not need the antibiotic.  She has ongoing pain with urination and urinary frequency.  She was unable to pick up the Pyridium prescription as it was not covered by her insurance and too expensive to pay out of pocket.  She inquired if there is another comparable medicine that is a lower cost that can be prescribed or either an over the counter medicine to help with painful urination and urinary frequency.  She stated that she smells a yeast-like odor and she has a milky vaginal discharge.  She still hasn't picked up her Diflucan but she plans to do so after being notified of a different prescription or recommendation for her pain.  Message routed to pcp for further recommendations.  She inquired if a different behavioral health office can be referred as the office she was referred to does not have therapy availability, only medication management.  She is not interested in medication management until meeting with a therapist to discuss her depression.  She attributes her depression to not feeling like she has fulfilled enough in her life. Reason for  Disposition  [1] POSITIVE urine test (i.e., NI + or LE + or WBC > 10) AND [2] standing order to call in prescription for antibiotic  Answer Assessment - Initial Assessment Questions 1. SEVERITY: "How bad is the pain?"  (e.g., Scale 1-10; mild, moderate, or severe)   - MILD (1-3): complains slightly about urination hurting   - MODERATE (4-7): interferes with normal activities     - SEVERE (8-10): excruciating, unwilling or unable to urinate because of the pain      Intermittently 10/10 doubling over 2. FREQUENCY: "How many times have you had painful urination today?"      Increased frequency 3. PATTERN: "Is pain present every time you urinate or just sometimes?"      Sometimes  4. ONSET: "When did the painful urination start?"      03/19/23 5. FEVER: "Do you have a fever?" If Yes, ask: "What is your temperature, how was it measured, and when did it start?"     Not that she knows of  6. PAST UTI: "Have you had a urine infection before?" If Yes, ask: "When was the last time?" and "What happened that time?"      Negative urine culture 7. CAUSE: "What do you think is causing the painful urination?"  (e.g., UTI, scratch, Herpes sore)     unsure 8. OTHER SYMPTOMS: "Do you have any other symptoms?" (e.g., blood in urine, flank pain, genital sores, urgency, vaginal discharge)     Urgency  Answer Assessment - Initial Assessment Questions 1. TEST: "What kind of urine test was performed?" (e.g., urinalysis, urine dipstick)  Urinalysis  2. URINALYSIS RESULT: "Was it positive or negative?"  If positive, document what was positive (e.g., LE, WBC, RBC, bacteria, epithelial cells)     Negative but still symptomatic 3. FEVER: "Do you have a fever?" If Yes, ask: "What is your temperature, how was it measured, and when did it start?"     No 4. FLANK PAIN: "Do you have any pain in your side?"     Lower back pain 5. OTHER SYMPTOMS: "Do you have any other symptoms?" (e.g., blood in urine, vomiting)      none 7. PHENAZOPYRIDINE (Uristat, Pyridium): "Have you taken phenazopyridine (turns your urine orange) recently?"     No  Protocols used: Urination Pain - Female-A-AH, Urinalysis Results Follow-up Call-A-AH

## 2023-03-25 ENCOUNTER — Ambulatory Visit (HOSPITAL_BASED_OUTPATIENT_CLINIC_OR_DEPARTMENT_OTHER)
Admission: RE | Admit: 2023-03-25 | Discharge: 2023-03-25 | Disposition: A | Discharge status: Home / Self Care | Payer: No Typology Code available for payment source | Source: Ambulatory Visit | Attending: Plastic Surgery | Admitting: Plastic Surgery

## 2023-03-25 ENCOUNTER — Ambulatory Visit (HOSPITAL_BASED_OUTPATIENT_CLINIC_OR_DEPARTMENT_OTHER)
Admission: RE | Admit: 2023-03-25 | Discharge: 2023-03-25 | Disposition: A | Discharge status: Home / Self Care | Payer: No Typology Code available for payment source | Source: Ambulatory Visit | Attending: Family Medicine | Admitting: Family Medicine

## 2023-03-25 ENCOUNTER — Encounter (HOSPITAL_BASED_OUTPATIENT_CLINIC_OR_DEPARTMENT_OTHER): Payer: Self-pay

## 2023-03-25 DIAGNOSIS — D489 Neoplasm of uncertain behavior, unspecified: Secondary | ICD-10-CM | POA: Insufficient documentation

## 2023-03-25 DIAGNOSIS — Z1231 Encounter for screening mammogram for malignant neoplasm of breast: Secondary | ICD-10-CM | POA: Diagnosis present

## 2023-03-25 NOTE — Telephone Encounter (Signed)
Error

## 2023-04-15 ENCOUNTER — Encounter: Payer: No Typology Code available for payment source | Admitting: Neurology

## 2023-04-29 ENCOUNTER — Ambulatory Visit (INDEPENDENT_AMBULATORY_CARE_PROVIDER_SITE_OTHER): Payer: No Typology Code available for payment source | Admitting: Neurology

## 2023-04-29 ENCOUNTER — Telehealth: Payer: Self-pay | Admitting: Neurology

## 2023-04-29 DIAGNOSIS — M79672 Pain in left foot: Secondary | ICD-10-CM | POA: Diagnosis not present

## 2023-04-29 DIAGNOSIS — R200 Anesthesia of skin: Secondary | ICD-10-CM

## 2023-04-29 DIAGNOSIS — M79671 Pain in right foot: Secondary | ICD-10-CM

## 2023-04-29 DIAGNOSIS — R202 Paresthesia of skin: Secondary | ICD-10-CM

## 2023-04-29 NOTE — Telephone Encounter (Signed)
 Discussed the results of patient's EMG after the procedure today. It showed no significant abnormalities, including no large fiber neuropathy or right lumbosacral radiculopathy. I explained that her burning pain may be due to small fiber neuropathy.  She is doing about the same as prior. 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 I recommended she try lidocaine cream as needed for burning pain.  We also discussed that her risk factors for neuropathy include diabetes and B12 deficiency. Treating both of these will help prevent worsening symptoms. She states she is taking her medications.  She will follow up as planned on 07/05/23.  All questions were answered.  Jacquelyne Balint, MD [facility]

## 2023-04-29 NOTE — Procedures (Signed)
 [facility]  [address], [address]  [place], [place] [address] Tel: [phone] Fax: [phone] Test Date:  04/29/2023  Patient: Colleen Downs DOB: [birth year] Physician: Jacquelyne Balint, MD  Sex: Female Height: 5\' 6"  Ref Phys: Jacquelyne Balint, MD  ID#: [id number]   Technician:    History: This is a 43 year old female with numbness and tingling in legs and hands.  NCV & EMG Findings: Extensive electrodiagnostic evaluation of the right lower limb with additional nerve conduction studies of the right upper limb shows: Right sural, superficial peroneal/fibular, median, ulnar, and radial sensory responses are within normal limits. Right tibial (AH) motor response shows reduced amplitude (3.4 mV). Right peroneal/fibular (EDB) motor response is within normal limits. Right H reflex latency is within normal limits. There is no evidence of active or chronic motor axon loss changes affecting any of the tested muscles. Motor unit configuration and recruitment pattern is within normal limits.  Impression: This is a normal study. Specifically: No electrodiagnostic evidence of a large fiber sensorimotor neuropathy. No electrodiagnostic evidence of a right lumbosacral (L3-S1) radiculopathy. No electrodiagnostic evidence of a right median mononeuropathy at or distal to the wrist (ie: carpal tunnel syndrome). Low amplitude right tibial (AH) motor response in the setting of other wise normal study, include needle examination of tibial innervated muscles is of unclear clinical significance and could be technical in nature.   ___________________________ Jacquelyne Balint, MD    Nerve Conduction Studies Motor Nerve Results    Latency Amplitude F-Lat Segment Distance CV Comment  Site (ms) Norm (mV) Norm (ms)  (cm) (m/s) Norm   Right Fibular (EDB) Motor  Ankle 3.4  < 5.5 3.0  > 3.0        Bel fib head 10.1 - 2.3 -  Bel fib head-Ankle 29 43  > 40   Pop fossa 11.8 - 2.2 -  Pop fossa-Bel fib head  9 53 -   Right Tibial (AH) Motor  Ankle 6.0  < 6.0 *3.4  > 8.0        Knee 12.3 - 3.0 -  Knee-Ankle 41 65  > 40    Sensory Sites    Neg Peak Lat Amplitude (O-P) Segment Distance Velocity Comment  Site (ms) Norm (V) Norm  (cm) (ms)   Right Median Sensory  Wrist-Dig II 3.0  < 3.4 57  > 20 Wrist-Dig II 13    Right Superficial Fibular Sensory  14 cm-Ankle 2.8  < 4.5 6  > 5 14 cm-Ankle 14    Right Sural Sensory  Calf-Lat mall 3.6  < 4.5 6  > 5 Calf-Lat mall 14    Right Ulnar Sensory  Wrist-Dig V 2.6  < 3.1 51  > 12 Wrist-Dig V 11     H-Reflex Results    M-Lat H Lat H Neg Amp H-M Lat  Site (ms) (ms) Norm (mV) (ms)  Right Tibial H-Reflex  Pop fossa 5.5 26.8  < 35.0 1.03 21.3   Electromyography   Side Muscle Ins.Act Fibs Fasc Recrt Amp Dur Poly Activation Comment  Right Tib ant Nml Nml Nml Nml Nml Nml Nml Nml N/A  Right Gastroc MH Nml Nml Nml Nml Nml Nml Nml Nml N/A  Right FDL Nml Nml Nml Nml Nml Nml Nml Nml N/A  Right Vastus lat Nml Nml Nml Nml Nml Nml Nml Nml N/A  Right Biceps fem SH Nml Nml Nml Nml Nml Nml Nml Nml N/A      Waveforms:  Motor  Sensory           H-Reflex

## 2023-05-08 ENCOUNTER — Other Ambulatory Visit: Payer: Self-pay | Admitting: Family Medicine

## 2023-05-08 DIAGNOSIS — E559 Vitamin D deficiency, unspecified: Secondary | ICD-10-CM

## 2023-05-09 NOTE — Telephone Encounter (Signed)
 Not sure what to tell patient to help?   Copied from CRM [id number]. Topic: Clinical - Prescription Issue >> May 09, 2023  1:31 PM Florestine Avers wrote: Reason for CRM: Patient called in stating that her insurance is no longer active and she really needs a refill on her gabapentin (NEURONTIN) 400 MG capsule, and her tirzepatide [facility]) 2.5 MG/0.5ML Pen. Patient wanted to know if there were any samples in the office or if there was a way for her to get the medication for free or a discounted rate. Patient is upset and requesting a call back.

## 2023-05-10 NOTE — Addendum Note (Signed)
 Addended by: Silvio Pate on: 05/10/2023 09:46 AM   Modules accepted: Orders

## 2023-05-10 NOTE — Telephone Encounter (Signed)
 Hi Tammy,   Any thoughts?

## 2023-05-10 NOTE — Telephone Encounter (Signed)
 Referral placed. Patient made aware. She states she was able to get medications for now.

## 2023-05-13 ENCOUNTER — Telehealth: Payer: Self-pay

## 2023-05-13 NOTE — Progress Notes (Signed)
 Complex Care Management Note  Care Guide Note 05/13/2023 Name: Colleen Downs MRN: [id number] DOB: [birth year]  Colleen Downs is a 43 y.o. year old female who sees Clayborne Dana, NP for primary care. I reached out to Purvis Kilts by phone today to offer complex care management services.  [name] was given information about Complex Care Management services today including:   The Complex Care Management services include support from the care team which includes your Nurse Care Manager, Clinical Social Worker, or Pharmacist.  The Complex Care Management team is here to help remove barriers to the health concerns and goals most important to you. Complex Care Management services are voluntary, and the patient may decline or stop services at any time by request to their care team member.   Complex Care Management Consent Status: Patient wishes to consider information provided and/or speak with a member of the care team before deciding to participate in complex care management services.   Follow up plan:  Telephone appointment with complex care management team member scheduled for:  05/22/23 at 1:00 p.m.   Encounter Outcome:  Patient Scheduled  [facility]  [facility], [facility] Health Care Management Assistant Direct Dial: [phone]  Fax: [phone]

## 2023-05-22 ENCOUNTER — Other Ambulatory Visit: Admitting: Pharmacist

## 2023-05-22 ENCOUNTER — Telehealth

## 2023-05-23 ENCOUNTER — Telehealth: Payer: Self-pay

## 2023-05-23 NOTE — Progress Notes (Signed)
 Complex Care Management Note  Care Guide Note 05/23/2023 Name: Colleen Downs MRN: [id number] DOB: Aug 24, 1980  Colleen Downs is a 43 y.o. year old female who sees Clayborne Dana, NP for primary care. I reached out to Purvis Kilts by phone today to offer complex care management services.  [name] was given information about Complex Care Management services today including:   The Complex Care Management services include support from the care team which includes your Nurse Care Manager, Clinical Social Worker, or Pharmacist.  The Complex Care Management team is here to help remove barriers to the health concerns and goals most important to you. Complex Care Management services are voluntary, and the patient may decline or stop services at any time by request to their care team member.   Complex Care Management Consent Status: Patient did not agree to participate in complex care management services at this time.  Encounter Outcome:  Patient Refused  Colleen Downs [facility], [facility] Health Care Management Assistant Direct Dial: [phone]  Fax: [phone]

## 2023-05-24 NOTE — Telephone Encounter (Signed)
 Thanks Tammy! I'll reach back out if needed.

## 2023-05-29 ENCOUNTER — Ambulatory Visit: Payer: No Typology Code available for payment source | Admitting: Family Medicine

## 2023-05-29 DIAGNOSIS — E559 Vitamin D deficiency, unspecified: Secondary | ICD-10-CM

## 2023-06-05 ENCOUNTER — Ambulatory Visit: Admitting: Family Medicine

## 2023-06-05 DIAGNOSIS — E559 Vitamin D deficiency, unspecified: Secondary | ICD-10-CM

## 2023-06-13 ENCOUNTER — Ambulatory Visit: Admitting: Family Medicine

## 2023-06-19 ENCOUNTER — Ambulatory Visit (INDEPENDENT_AMBULATORY_CARE_PROVIDER_SITE_OTHER): Admitting: Family Medicine

## 2023-06-19 ENCOUNTER — Encounter: Payer: Self-pay | Admitting: Family Medicine

## 2023-06-19 DIAGNOSIS — E559 Vitamin D deficiency, unspecified: Secondary | ICD-10-CM

## 2023-06-19 DIAGNOSIS — E782 Mixed hyperlipidemia: Secondary | ICD-10-CM | POA: Diagnosis not present

## 2023-06-19 DIAGNOSIS — Z7985 Long-term (current) use of injectable non-insulin antidiabetic drugs: Secondary | ICD-10-CM

## 2023-06-19 DIAGNOSIS — E1169 Type 2 diabetes mellitus with other specified complication: Secondary | ICD-10-CM | POA: Diagnosis not present

## 2023-06-19 DIAGNOSIS — F331 Major depressive disorder, recurrent, moderate: Secondary | ICD-10-CM

## 2023-06-19 MED ORDER — ATORVASTATIN CALCIUM 10 MG PO TABS
10.0000 mg | ORAL_TABLET | Freq: Every day | ORAL | 3 refills | Status: DC
Start: 2023-06-19 — End: 2023-12-19

## 2023-06-19 NOTE — Assessment & Plan Note (Signed)
 Hyperlipidemia managed with Lipitor 10 mg. Medication compliance noted. - Refill Lipitor prescription.

## 2023-06-19 NOTE — Assessment & Plan Note (Signed)
 Type 2 diabetes managed with Mounjaro  2.5 mg. Resumed treatment after insurance issue resolved. Low-carb diet and regular exercise maintained. 10-pound weight loss achieved.  - Continue Mounjaro  2.5 mg. Consider dose increase after four doses. - Send message for dose adjustment when refill needed. - Labs today

## 2023-06-19 NOTE — Patient Instructions (Signed)
 [facility] Address: [address], [place], [place] [address] Open ? Closes 5?PM Phone: [phone]

## 2023-06-19 NOTE — Assessment & Plan Note (Signed)
 Labs today

## 2023-06-19 NOTE — Progress Notes (Signed)
 Established Patient Office Visit  Subjective   [name], female    DOB: 01/18/1981  Age: 43 y.o. MRN: [id number]  CC:  follow-up, chronic disease management - refills    HPI  Patient is here for routine follow-up.    DIABETES: - Checking BG at home: no - Medications: Mounjaro  2.5 mg/week - Compliance: she was out for a little while due to insurance, she has been back on for 1 dose  - Diet: low carb, mostly vegetarian - Exercise: walking >5k steps/day - Eye exam: referral placed last visit  - Foot exam: UTD - Microalbumin: UTD - Denies symptoms of hypoglycemia, polyuria, polydipsia, foot ulcers/trauma, wounds that are not healing, medication side effects  - taking gabapentin  at bedtime for neuropathy (following with Neurology)   Lab Results  Component Value Date   HGBA1C 8.2 (H) 02/28/2023   Wt Readings from Last 3 Encounters:  06/19/23 231 lb 12.8 oz (105.1 kg)  03/19/23 241 lb (109.3 kg)  03/13/23 240 lb (108.9 kg)     HYPERLIPIDEMIA - medications: atorvastatin  10 mg daily - compliance: good - medication SEs: none The 10-year ASCVD risk score (Arnett DK, et al., 2019) is: 1.7%   Values used to calculate the score:     Age: 17 years     Sex: Female     Is Non-Hispanic African American: Yes     Diabetic: Yes     Tobacco smoker: No     Systolic Blood Pressure: 126 mmHg     Is BP treated: No     HDL Cholesterol: 54.9 mg/dL     Total Cholesterol: 211 mg/dL   Vitamin D  Deficiency: - taking high-dose weekly supplement   Vitamin B12 deficiency: - Neurology has recommended she start B12 1,000 mcg daily.         02/28/2023    2:33 PM 09/13/2021    1:48 PM 07/13/2021    1:02 PM  PHQ9 SCORE ONLY  PHQ-9 Total Score 4 16 15       02/28/2023    2:34 PM 02/05/2022    1:48 PM 09/13/2021    1:48 PM 07/13/2021    1:02 PM  GAD 7 : Generalized Anxiety Score  Nervous, Anxious, on Edge 1 0 1 3  Control/stop worrying 1 1 2 3   Worry too much -  different things 1 1 2 3   Trouble relaxing 1 0 1 1  Restless 0 0 0 0  Easily annoyed or irritable 1 1 2 3   Afraid - awful might happen 1 0 2 2  Total GAD 7 Score 6 3 10 15   Anxiety Difficulty Somewhat difficult Not difficult at all Somewhat difficult        ROS All review of systems negative except what is listed in the HPI    Objective:     BP 126/62 (BP Location: Right Arm, Patient Position: Sitting, Cuff Size: Large)   Pulse 67   Temp 98.9 F (37.2 C) (Oral)   Resp 12   Ht 5\' 6"  (1.676 m)   Wt 231 lb 12.8 oz (105.1 kg)   LMP 05/19/2023   SpO2 100%   BMI 37.41 kg/m    Physical Exam Vitals reviewed.  Constitutional:      General: She is not in acute distress.    Appearance: Normal appearance. She is obese. She is not ill-appearing.  HENT:     Ears:     Comments: Left ear, sebaceous cyst Cardiovascular:  Rate and Rhythm: Normal rate and regular rhythm.  Pulmonary:     Effort: Pulmonary effort is normal.     Breath sounds: Normal breath sounds.  Musculoskeletal:        General: Normal range of motion.     Cervical back: Normal range of motion and neck supple.     Right lower leg: No edema.     Left lower leg: No edema.  Skin:    General: Skin is warm and dry.  Neurological:     General: No focal deficit present.     Mental Status: She is alert and oriented to person, place, and time. Mental status is at baseline.  Psychiatric:        Mood and Affect: Mood normal.        Behavior: Behavior normal.        Thought Content: Thought content normal.        Judgment: Judgment normal.      No results found for any visits on 06/19/23.     The 10-year ASCVD risk score (Arnett DK, et al., 2019) is: 1.7%    Assessment & Plan:   Problem List Items Addressed This Visit       Active Problems   Type 2 diabetes mellitus with morbid obesity (HCC) - Primary   Type 2 diabetes managed with Mounjaro  2.5 mg. Resumed treatment after insurance issue resolved.  Low-carb diet and regular exercise maintained. 10-pound weight loss achieved.  - Continue Mounjaro  2.5 mg. Consider dose increase after four doses. - Send message for dose adjustment when refill needed. - Labs today      Relevant Medications   atorvastatin  (LIPITOR) 10 MG tablet   Other Relevant Orders   Basic metabolic panel with GFR   Hemoglobin A1c   Vitamin D  deficiency   Labs today        Relevant Orders   VITAMIN D  25 Hydroxy (Vit-D Deficiency, Fractures)   Mixed hyperlipidemia   Hyperlipidemia managed with Lipitor 10 mg. Medication compliance noted. - Refill Lipitor prescription.      Relevant Medications   atorvastatin  (LIPITOR) 10 MG tablet   Major depression   Reports recent stressors and desire for therapy. No SI/HI -Refer to counseling services. Not interested in medications at this time.       Relevant Orders   Ambulatory referral to Behavioral Health          Return in about 3 months (around 09/18/2023) for routine follow-up.     Everlina Hock, NP

## 2023-06-19 NOTE — Assessment & Plan Note (Signed)
 Reports recent stressors and desire for therapy. No SI/HI -Refer to counseling services. Not interested in medications at this time.

## 2023-06-20 ENCOUNTER — Encounter: Payer: Self-pay | Admitting: Family Medicine

## 2023-06-20 LAB — BASIC METABOLIC PANEL WITH GFR
BUN: 8 mg/dL (ref 6–23)
CO2: 24 meq/L (ref 19–32)
Calcium: 9.4 mg/dL (ref 8.4–10.5)
Chloride: 102 meq/L (ref 96–112)
Creatinine, Ser: 0.89 mg/dL (ref 0.40–1.20)
GFR: 79.89 mL/min (ref >60.00)
Glucose, Bld: 92 mg/dL (ref 70–99)
Potassium: 4.3 meq/L (ref 3.5–5.1)
Sodium: 135 meq/L (ref 135–145)

## 2023-06-20 LAB — HEMOGLOBIN A1C: Hgb A1c MFr Bld: 7 % — ABNORMAL HIGH (ref 4.6–6.5)

## 2023-06-20 LAB — VITAMIN D 25 HYDROXY (VIT D DEFICIENCY, FRACTURES): VITD: 33.43 ng/mL (ref 30.00–100.00)
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
## 2023-07-05 ENCOUNTER — Ambulatory Visit: Payer: No Typology Code available for payment source | Admitting: Neurology

## 2023-07-12 ENCOUNTER — Other Ambulatory Visit: Payer: Self-pay | Admitting: Family Medicine

## 2023-07-12 DIAGNOSIS — E1169 Type 2 diabetes mellitus with other specified complication: Secondary | ICD-10-CM

## 2023-07-12 MED ORDER — MOUNJARO 5 MG/0.5ML ~~LOC~~ SOAJ: 5.0000 mg | SUBCUTANEOUS | 0 refills | Status: AC

## 2023-07-12 NOTE — Telephone Encounter (Signed)
 From last visit:   Type 2 diabetes mellitus with morbid obesity (HCC) - Primary     Type 2 diabetes managed with Mounjaro  2.5 mg. Resumed treatment after insurance issue resolved. Low-carb diet and regular exercise maintained. 10-pound weight loss achieved.  - Continue Mounjaro  2.5 mg. Consider dose increase after four doses. - Send message for dose adjustment when refill needed.     Please advise on increase to 5 mg dosage?

## 2023-07-12 NOTE — Telephone Encounter (Signed)
 Copied from CRM #[id number]. Topic: Clinical - Medication Question >> Jul 12, 2023 12:22 PM Shereese L wrote: Reason for CRM: patient called to advise the doctor that she done with the tirzepatide  (MOUNJARO ) 2.5 MG/0.5ML Pen and was advised to call to get a refill with a higher dosage
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
## 2023-09-06 ENCOUNTER — Encounter: Payer: Self-pay | Admitting: Neurology

## 2023-09-06 ENCOUNTER — Ambulatory Visit: Admitting: Neurology

## 2023-09-11 ENCOUNTER — Telehealth: Payer: Self-pay | Admitting: Neurology

## 2023-09-11 MED ORDER — TIRZEPATIDE 2.5 MG/0.5ML ~~LOC~~ SOAJ: 2.5000 mg | SUBCUTANEOUS | 0 refills | Status: DC

## 2023-09-11 NOTE — Telephone Encounter (Signed)
 Last note does indicate she is taking Mounjaro  2.5 mg weekly. Not on current list. Sent one month. She is due for follow up now. Note written on script.   Copied from CRM [id number]. Topic: Clinical - Medication Question >> Sep 11, 2023  3:11 PM Chasity T wrote: Reason for CRM: patient is calling in to request a refill for medication majaro but I am unable to find it on her medication list. Please contact patient regarding medication.

## 2023-09-18 ENCOUNTER — Ambulatory Visit: Admitting: Family Medicine

## 2023-09-18 ENCOUNTER — Encounter: Payer: Self-pay | Admitting: Family Medicine

## 2023-09-18 ENCOUNTER — Other Ambulatory Visit (HOSPITAL_BASED_OUTPATIENT_CLINIC_OR_DEPARTMENT_OTHER): Payer: Self-pay

## 2023-09-18 DIAGNOSIS — E1169 Type 2 diabetes mellitus with other specified complication: Secondary | ICD-10-CM | POA: Diagnosis not present

## 2023-09-18 DIAGNOSIS — F331 Major depressive disorder, recurrent, moderate: Secondary | ICD-10-CM

## 2023-09-18 DIAGNOSIS — Z7985 Long-term (current) use of injectable non-insulin antidiabetic drugs: Secondary | ICD-10-CM

## 2023-09-18 DIAGNOSIS — R600 Localized edema: Secondary | ICD-10-CM

## 2023-09-18 DIAGNOSIS — E538 Deficiency of other specified B group vitamins: Secondary | ICD-10-CM | POA: Diagnosis not present

## 2023-09-18 DIAGNOSIS — E559 Vitamin D deficiency, unspecified: Secondary | ICD-10-CM | POA: Diagnosis not present

## 2023-09-18 DIAGNOSIS — E1140 Type 2 diabetes mellitus with diabetic neuropathy, unspecified: Secondary | ICD-10-CM

## 2023-09-18 DIAGNOSIS — E1142 Type 2 diabetes mellitus with diabetic polyneuropathy: Secondary | ICD-10-CM

## 2023-09-18 MED ORDER — LANCET DEVICE MISC
1.0000 | Freq: Three times a day (TID) | 0 refills | Status: AC
  Filled 2023-09-18: qty 1, 30d supply, fill #0

## 2023-09-18 MED ORDER — BLOOD GLUCOSE MONITOR SYSTEM W/DEVICE KIT
1.0000 | PACK | Freq: Three times a day (TID) | 0 refills | Status: DC
  Filled 2023-09-18 – 2023-10-07 (×2): qty 1, 30d supply, fill #0

## 2023-09-18 MED ORDER — GABAPENTIN 400 MG PO CAPS
800.0000 mg | ORAL_CAPSULE | Freq: Every day | ORAL | 5 refills | Status: DC
  Filled 2023-09-18 – 2023-10-07 (×2): qty 60, 30d supply, fill #0

## 2023-09-18 MED ORDER — BLOOD GLUCOSE TEST VI STRP
1.0000 | ORAL_STRIP | Freq: Three times a day (TID) | 0 refills | Status: AC
  Filled 2023-09-18 – 2023-12-19 (×3): qty 100, 30d supply, fill #0

## 2023-09-18 MED ORDER — TIRZEPATIDE 2.5 MG/0.5ML ~~LOC~~ SOAJ
2.5000 mg | SUBCUTANEOUS | 0 refills | Status: DC
  Filled 2023-09-18 – 2023-09-20 (×3): qty 2, 28d supply, fill #0

## 2023-09-18 MED ORDER — ACCU-CHEK SOFTCLIX LANCETS MISC
1.0000 | Freq: Three times a day (TID) | 0 refills | Status: AC
  Filled 2023-09-18: qty 100, 30d supply, fill #0
  Filled 2023-10-07: qty 100, 34d supply, fill #0
  Filled 2023-12-19: qty 100, 30d supply, fill #0

## 2023-09-18 NOTE — Assessment & Plan Note (Signed)
 See other

## 2023-09-18 NOTE — Assessment & Plan Note (Signed)
 Worsening symptoms with stressors. Prefers counseling over medication. Poor diet may affect mood. - Place referral for counseling. No SI/HI. - Provide handout of local counseling resources.

## 2023-09-18 NOTE — Assessment & Plan Note (Signed)
 Refill gabapentin . Reports she is due to see neurology in November

## 2023-09-18 NOTE — Progress Notes (Signed)
 Established Patient Office Visit  Subjective   [name], female    DOB: 1980-07-26  Age: 43 y.o. MRN: [id number]  CC:  follow-up, chronic disease management - refills    HPI  Patient is here for routine follow-up.  Discussed the use of AI scribe software for clinical note transcription with the patient, who gave verbal consent to proceed.  History of Present Illness Colleen Downs is a 43 year old female with depression and diabetes who presents with medication management issues and worsening depression.  She is experiencing significant challenges with medication management, particularly with obtaining her Mounjaro  prescription. She has been without Mounjaro  for about two weeks due to issues with her pharmacy, which has been closing down and failing to fulfill her prescriptions. She continues to take gabapentin  and vitamin D , mentioning taking vitamin D3 over the counter and is unsure if she is taking vitamin B12. She is running low on gabapentin  and has a surplus of atorvastatin .  Her depression has worsened, which she attributes to recent personal events, including the arrest of her nephew and the passing of her great aunt. She experiences a lack of appetite, difficulty with personal care, and extreme fatigue. She has not been eating well and has lost interest in activities, stating, 'I don't want to take a bath. I don't want to do anything.'  She mentions that she has been walking a lot due to her job with [employer], but her diet has been poor. She has not been monitoring her blood sugar due to the loss of her glucometer when she moved.  She reports swelling in her feet, which she has been experiencing for a while. She has been experiencing this for a while and notes that her urine has a sweet smell. She is concerned about her kidneys and mentions retaining a lot of fluid.  She has not been able to follow up with her eye doctor or plastic surgeon due to losing contact  information and other personal issues. She reports issues with her right eye, stating she is 'not really seeing really clear out of this eye.' She needs the phone number to her eye doctor.    DIABETES: - Checking BG at home: no - Medications: Mounjaro  2.5 mg/week - Compliance: ran out about 2 weeks - Diet: low carb, mostly vegetarian - Exercise: walking >5k steps/day - Eye exam: referral placed last visit  - Foot exam: today - Microalbumin: UTD - Denies symptoms of hypoglycemia, polyuria, polydipsia, foot ulcers/trauma, wounds that are not healing, medication side effects  - taking gabapentin  at bedtime for neuropathy (following with Neurology)   Lab Results  Component Value Date   HGBA1C 7.0 (H) 06/19/2023   Wt Readings from Last 3 Encounters:  09/18/23 232 lb (105.2 kg)  06/19/23 231 lb 12.8 oz (105.1 kg)  03/19/23 241 lb (109.3 kg)    HYPERLIPIDEMIA - medications: atorvastatin  10 mg daily - compliance: good - medication SEs: none The 10-year ASCVD risk score (Arnett DK, et al., 2019) is: 2.7%   Values used to calculate the score:     Age: 61 years     Clincally relevant sex: Female     Is Non-Hispanic African American: Yes     Diabetic: Yes     Tobacco smoker: No     Systolic Blood Pressure: 138 mmHg     Is BP treated: No     HDL Cholesterol: 54.9 mg/dL     Total Cholesterol: 211 mg/dL  Vitamin D  Deficiency: - taking high-dose weekly supplement   Vitamin B12 deficiency: - Neurology has recommended she start B12 1,000 mcg daily.         02/28/2023    2:33 PM 09/13/2021    1:48 PM 07/13/2021    1:02 PM  PHQ9 SCORE ONLY  PHQ-9 Total Score 4 16 15       02/28/2023    2:34 PM 02/05/2022    1:48 PM 09/13/2021    1:48 PM 07/13/2021    1:02 PM  GAD 7 : Generalized Anxiety Score  Nervous, Anxious, on Edge 1 0 1 3  Control/stop worrying 1 1 2 3   Worry too much - different things 1 1 2 3   Trouble relaxing 1 0 1 1  Restless 0 0 0 0  Easily annoyed or irritable 1  1 2 3   Afraid - awful might happen 1 0 2 2  Total GAD 7 Score 6 3 10 15   Anxiety Difficulty Somewhat difficult Not difficult at all Somewhat difficult        ROS All review of systems negative except what is listed in the HPI    Objective:     BP (!) 138/58   Pulse 72   Ht 5' 6 (1.676 m)   Wt 232 lb (105.2 kg)   SpO2 100%   BMI 37.45 kg/m    Physical Exam Vitals reviewed.  Constitutional:      General: She is not in acute distress.    Appearance: Normal appearance. She is obese. She is not ill-appearing.  Cardiovascular:     Rate and Rhythm: Normal rate and regular rhythm.  Pulmonary:     Effort: Pulmonary effort is normal.     Breath sounds: Normal breath sounds.  Musculoskeletal:        General: Normal range of motion.     Cervical back: Normal range of motion and neck supple.     Right lower leg: 2+ Edema present.     Left lower leg: 2+ Edema present.  Skin:    General: Skin is warm and dry.  Neurological:     General: No focal deficit present.     Mental Status: She is alert and oriented to person, place, and time. Mental status is at baseline.  Psychiatric:        Mood and Affect: Mood normal.        Behavior: Behavior normal.        Thought Content: Thought content normal.        Judgment: Judgment normal.      No results found for any visits on 09/18/23.     The 10-year ASCVD risk score (Arnett DK, et al., 2019) is: 2.7%    Assessment & Plan:   Problem List Items Addressed This Visit       Active Problems   Type 2 diabetes mellitus with morbid obesity (HCC) - Primary   Poor glycemic control due to medication lapse and poor diet.  - Send prescription for Mounjaro  2.5 mg weekly  - Order new glucometer. - Order blood work for glucose control and kidney function. - Advise daily compression socks. - Advise reducing salt intake and elevating feet when sitting. - Consider diuretic therapy if kidney function stable.      Relevant  Medications   tirzepatide  (MOUNJARO ) 2.5 MG/0.5ML Pen   Blood Glucose Monitoring Suppl (BLOOD GLUCOSE MONITOR SYSTEM) w/Device KIT   Glucose Blood (BLOOD GLUCOSE TEST STRIPS) STRP   Lancet Device  MISC   Accu-Chek Softclix Lancets lancets   Other Relevant Orders   Hemoglobin A1c   Microalbumin / creatinine urine ratio   Basic metabolic panel with GFR   TSH   Vitamin D  deficiency   Supplement and monitor.       Relevant Orders   VITAMIN D  25 Hydroxy (Vit-D Deficiency, Fractures)   Major depression   Worsening symptoms with stressors. Prefers counseling over medication. Poor diet may affect mood. - Place referral for counseling. No SI/HI. - Provide handout of local counseling resources.      Relevant Orders   Ambulatory referral to Behavioral Health   B12 deficiency   Supplement and monitor.       Relevant Orders   B12 and Folate Panel   Diabetic polyneuropathy associated with type 2 diabetes mellitus (HCC)   Refill gabapentin . Reports she is due to see neurology in November       Relevant Medications   tirzepatide  (MOUNJARO ) 2.5 MG/0.5ML Pen   gabapentin  (NEURONTIN ) 400 MG capsule   Type 2 diabetes mellitus with diabetic neuropathy, without long-term current use of insulin  (HCC)   See other      Relevant Medications   tirzepatide  (MOUNJARO ) 2.5 MG/0.5ML Pen   gabapentin  (NEURONTIN ) 400 MG capsule   Other Relevant Orders   Urinalysis w microscopic + reflex cultur   Other Visit Diagnoses       Bilateral lower extremity edema     Labs today.  Low sodium diet, wear compression socks, elevate legs when seated.  May try short-term diuretic pending labs.  Patient aware of signs/symptoms requiring further/urgent evaluation.    Relevant Orders   Urinalysis w microscopic + reflex cultur   B Nat Peptide            Return in about 3 months (around 12/19/2023) for chronic disease management.     Colleen Downs Mon, NP

## 2023-09-18 NOTE — Patient Instructions (Addendum)
 [facility] [address] [place], [address], [place], [place] [address]  [phone]  [place] -  [address], [place], [place] [address]  [phone]

## 2023-09-18 NOTE — Assessment & Plan Note (Signed)
 Poor glycemic control due to medication lapse and poor diet.  - Send prescription for Mounjaro  2.5 mg weekly  - Order new glucometer. - Order blood work for glucose control and kidney function. - Advise daily compression socks. - Advise reducing salt intake and elevating feet when sitting. - Consider diuretic therapy if kidney function stable.

## 2023-09-18 NOTE — Assessment & Plan Note (Signed)
 Supplement and monitor

## 2023-09-19 LAB — VITAMIN D 25 HYDROXY (VIT D DEFICIENCY, FRACTURES): VITD: 28.26 ng/mL — ABNORMAL LOW (ref 30.00–100.00)

## 2023-09-19 LAB — BASIC METABOLIC PANEL WITH GFR
BUN: 6 mg/dL (ref 6–23)
CO2: 23 meq/L (ref 19–32)
Calcium: 8.9 mg/dL (ref 8.4–10.5)
Chloride: 104 meq/L (ref 96–112)
Creatinine, Ser: 0.8 mg/dL (ref 0.40–1.20)
GFR: 90.63 mL/min (ref >60.00)
Glucose, Bld: 126 mg/dL — ABNORMAL HIGH (ref 70–99)
Potassium: 3.7 meq/L (ref 3.5–5.1)
Sodium: 136 meq/L (ref 135–145)

## 2023-09-19 LAB — HEMOGLOBIN A1C: Hgb A1c MFr Bld: 7.3 % — ABNORMAL HIGH (ref 4.6–6.5)

## 2023-09-19 LAB — BRAIN NATRIURETIC PEPTIDE: Pro B Natriuretic peptide (BNP): 41 pg/mL (ref 0.0–100.0)

## 2023-09-19 LAB — MICROALBUMIN / CREATININE URINE RATIO
Creatinine,U: 237.7 mg/dL
Microalb Creat Ratio: 7.7 mg/g (ref 0.0–30.0)
Microalb, Ur: 1.8 mg/dL (ref 0.0–1.9)

## 2023-09-19 LAB — TSH: TSH: 0.82 u[IU]/mL (ref 0.35–5.50)

## 2023-09-19 LAB — B12 AND FOLATE PANEL
Folate: 6.8 ng/mL (ref >5.9)
Vitamin B-12: 754 pg/mL (ref 211–911)

## 2023-09-20 ENCOUNTER — Ambulatory Visit: Payer: Self-pay | Admitting: Family Medicine

## 2023-09-20 ENCOUNTER — Other Ambulatory Visit (HOSPITAL_BASED_OUTPATIENT_CLINIC_OR_DEPARTMENT_OTHER): Payer: Self-pay

## 2023-09-20 DIAGNOSIS — R300 Dysuria: Secondary | ICD-10-CM

## 2023-09-20 DIAGNOSIS — R600 Localized edema: Secondary | ICD-10-CM

## 2023-09-20 MED ORDER — POTASSIUM CHLORIDE CRYS ER 20 MEQ PO TBCR: 20.0000 meq | EXTENDED_RELEASE_TABLET | Freq: Every day | ORAL | 0 refills | Status: DC

## 2023-09-20 MED ORDER — FUROSEMIDE 20 MG PO TABS: 20.0000 mg | ORAL_TABLET | Freq: Every day | ORAL | 0 refills | Status: DC

## 2023-09-20 MED ORDER — CEPHALEXIN 500 MG PO CAPS: 500.0000 mg | ORAL_CAPSULE | Freq: Two times a day (BID) | ORAL | 0 refills | Status: AC

## 2023-09-21 LAB — URINALYSIS W MICROSCOPIC + REFLEX CULTURE
Bilirubin Urine: NEGATIVE
Glucose, UA: NEGATIVE
Hyaline Cast: NONE SEEN /LPF
Ketones, ur: NEGATIVE
Nitrites, Initial: NEGATIVE
Protein, ur: NEGATIVE
Specific Gravity, Urine: 1.023 (ref 1.001–1.035)
pH: <=5 — AB (ref 5.0–8.0)

## 2023-09-21 LAB — URINE CULTURE
MICRO NUMBER:: 16738815
SPECIMEN QUALITY:: ADEQUATE

## 2023-09-21 LAB — CULTURE INDICATED

## 2023-09-30 ENCOUNTER — Other Ambulatory Visit (HOSPITAL_BASED_OUTPATIENT_CLINIC_OR_DEPARTMENT_OTHER): Payer: Self-pay

## 2023-10-07 ENCOUNTER — Other Ambulatory Visit: Payer: Self-pay | Admitting: Neurology

## 2023-10-07 ENCOUNTER — Telehealth: Payer: Self-pay | Admitting: Family Medicine

## 2023-10-07 ENCOUNTER — Other Ambulatory Visit (HOSPITAL_BASED_OUTPATIENT_CLINIC_OR_DEPARTMENT_OTHER): Payer: Self-pay

## 2023-10-07 ENCOUNTER — Other Ambulatory Visit: Payer: Self-pay | Admitting: Critical Care Medicine

## 2023-10-07 DIAGNOSIS — E538 Deficiency of other specified B group vitamins: Secondary | ICD-10-CM

## 2023-10-07 MED ORDER — VITAMIN B-12 1000 MCG PO TABS
1000.0000 ug | ORAL_TABLET | Freq: Every day | ORAL | 1 refills | Status: DC
  Filled 2023-10-07: qty 100, 100d supply, fill #0

## 2023-10-07 NOTE — Telephone Encounter (Signed)
 Last lab  Vitamin B12 -2 weeks ago. Please advise

## 2023-10-07 NOTE — Telephone Encounter (Signed)
 Please forward to a provider that takes insurance. Thanks!

## 2023-10-07 NOTE — Telephone Encounter (Signed)
 Copied from CRM [id number]. Topic: Referral - Question >> Oct 07, 2023 11:45 AM Lavanda D wrote: Reason for CRM: Patient previously had a referral to [facility] but stated that they cannot see her. She is wondering if she could have a new referral sent to an eye doctor that accepts her insurance.

## 2023-10-08 ENCOUNTER — Other Ambulatory Visit (HOSPITAL_BASED_OUTPATIENT_CLINIC_OR_DEPARTMENT_OTHER): Payer: Self-pay

## 2023-10-09 ENCOUNTER — Other Ambulatory Visit (HOSPITAL_BASED_OUTPATIENT_CLINIC_OR_DEPARTMENT_OTHER): Payer: Self-pay

## 2023-10-21 ENCOUNTER — Other Ambulatory Visit (HOSPITAL_BASED_OUTPATIENT_CLINIC_OR_DEPARTMENT_OTHER): Payer: Self-pay

## 2023-10-30 ENCOUNTER — Ambulatory Visit (INDEPENDENT_AMBULATORY_CARE_PROVIDER_SITE_OTHER): Admitting: Plastic Surgery

## 2023-10-30 ENCOUNTER — Encounter: Payer: Self-pay | Admitting: Plastic Surgery

## 2023-10-30 DIAGNOSIS — L905 Scar conditions and fibrosis of skin: Secondary | ICD-10-CM

## 2023-10-30 DIAGNOSIS — L729 Follicular cyst of the skin and subcutaneous tissue, unspecified: Secondary | ICD-10-CM

## 2023-10-30 DIAGNOSIS — D489 Neoplasm of uncertain behavior, unspecified: Secondary | ICD-10-CM

## 2023-10-30 NOTE — Progress Notes (Signed)
 Referring Provider Almarie Waddell NOVAK, NP [address] [place],  [place] [address]   CC: No chief complaint on file.     Colleen Downs is an 43 y.o. female.  HPI: Colleen Downs is a 43 year old female who was previously seen in the office for a sebaceous cyst in the left preauricular region.  She underwent an ultrasound which confirmed that this was a well-circumscribed cyst consistent with an epidermal inclusion cyst.  She was lost to follow-up and she returns today with the same complaint plus for new lesions all consistent with epidermal inclusion cyst and a scar on the right shoulder that she would like addressed.  Allergies  Allergen Reactions   Sulfa Drugs Cross Reactors Anaphylaxis   Latex     Outpatient Encounter Medications as of 10/30/2023  Medication Sig   atorvastatin  (LIPITOR) 10 MG tablet Take 1 tablet (10 mg total) by mouth daily.   Blood Glucose Monitoring Suppl (BLOOD GLUCOSE MONITOR SYSTEM) w/Device KIT Use to test blood sugar 3 times daily   Cholecalciferol (VITAMIN D3) 25 MCG (1000 UT) CAPS Take 1 capsule (1,000 Units total) by mouth daily.   cyanocobalamin  (VITAMIN B12) 1000 MCG tablet Take 1 tablet (1,000 mcg total) by mouth daily.   furosemide  (LASIX ) 20 MG tablet Take 1 tablet (20 mg total) by mouth daily for 3 days.   gabapentin  (NEURONTIN ) 400 MG capsule Take 2 capsules (800 mg total) by mouth at bedtime.   potassium chloride  SA (KLOR-CON  M) 20 MEQ tablet Take 1 tablet (20 mEq total) by mouth daily for 3 days. While on Lasix .   tirzepatide  (MOUNJARO ) 2.5 MG/0.5ML Pen Inject 2.5 mg into the skin once a week.   Vitamin D , Ergocalciferol , (DRISDOL ) 1.25 MG (50000 UNIT) CAPS capsule TAKE 1 CAPSULE BY MOUTH EVERY 7 DAYS   No facility-administered encounter medications on file as of 10/30/2023.     Past Medical History:  Diagnosis Date   Diabetes mellitus without complication (HCC)    Diabetic neuropathy (HCC)    Diarrhea    High cholesterol     Irregular periods    Severe acute respiratory syndrome coronavirus 2 (SARS-CoV-2) vaccination declined 09/03/2019   IMO SNOMED Dx Update Oct 2024      Past Surgical History:  Procedure Laterality Date   CHOLECYSTECTOMY      Family History  Problem Relation Age of Onset   Rheum arthritis Mother    Rheum arthritis Sister    Arthritis Maternal Uncle    Arthritis Paternal Uncle    Diabetes Maternal Grandmother     Social History   Social History Narrative   Are you right handed or left handed? Right   Are you currently employed ?    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 nieces   Who lives with you?    What type of home do you live in: 1 story or 2 story? two    Caffeine rarely 1 or 2 a week     Review of Systems General: Denies fevers, chills, weight loss CV: Denies chest pain, shortness of breath, palpitations Skin: 2 x 3 cm mass in the left preauricular region.  A 5 mm mass in the left cheek 2 masses in the postauricular sulcus 1 approximately 1 cm and the other 1 approximately 5 mm and then a large cyst on the back approximately 2 x 2 cm.  Scar on the right shoulder approximately 3  cm in length.  No drainage but she does complain of pain in all of the areas especially in the left preauricular region.  Physical Exam    09/18/2023    2:14 PM 06/19/2023    3:19 PM 03/19/2023    1:08 PM  Vitals with BMI  Height 5' 6 5' 6   Weight 232 lbs 231 lbs 13 oz   BMI 37.46 37.43   Systolic 138 126 874  Diastolic 58 62 66  Pulse 72 67     General:  No acute distress,  Alert and oriented, Non-Toxic, Normal speech and affect Skin: Lesions as noted above.  All the lesions appear to be epidermal inclusion cyst except for the right shoulder which is a hypertrophic scar. Mammogram: January 2025 BI-RADS 1 Assessment/Plan Cyst: Patient has 5 cyst 4 on the face and 1 on the posterior left shoulder all consistent with epidermal inclusion cyst.  We  discussed the probable pathology for epidermoid cyst as well as the treatment.  She understands it would need to be excised and there is a risk of recurrence and infection.  Scar: Patient has a hypertrophic scar on the right shoulder.  This is probably due to her bra strap irritating the skin in this area. I have offered to inject it and she is in agreement that she would like to wait until she is [place] regularly to have this injected.  Risks of bleeding infection and scarring were all discussed.  Photographs were obtained today with her consent.  Will schedule her for removal of the lesions in the operating room at her request.  Colleen Downs 10/30/2023, 2:37 PM

## 2023-11-08 ENCOUNTER — Telehealth: Payer: Self-pay | Admitting: Plastic Surgery

## 2023-11-08 NOTE — Telephone Encounter (Signed)
Patient returned call to schedule surgery

## 2023-11-11 ENCOUNTER — Telehealth: Payer: Self-pay | Admitting: Plastic Surgery

## 2023-11-11 NOTE — Telephone Encounter (Signed)
 Patient called to schedule surgery.

## 2023-12-19 ENCOUNTER — Encounter: Payer: Self-pay | Admitting: Family Medicine

## 2023-12-19 ENCOUNTER — Ambulatory Visit: Admitting: Family Medicine

## 2023-12-19 ENCOUNTER — Other Ambulatory Visit: Payer: Self-pay

## 2023-12-19 ENCOUNTER — Other Ambulatory Visit (HOSPITAL_BASED_OUTPATIENT_CLINIC_OR_DEPARTMENT_OTHER): Payer: Self-pay

## 2023-12-19 DIAGNOSIS — E1169 Type 2 diabetes mellitus with other specified complication: Secondary | ICD-10-CM

## 2023-12-19 DIAGNOSIS — E538 Deficiency of other specified B group vitamins: Secondary | ICD-10-CM

## 2023-12-19 DIAGNOSIS — Z7985 Long-term (current) use of injectable non-insulin antidiabetic drugs: Secondary | ICD-10-CM

## 2023-12-19 DIAGNOSIS — E559 Vitamin D deficiency, unspecified: Secondary | ICD-10-CM | POA: Diagnosis not present

## 2023-12-19 DIAGNOSIS — F411 Generalized anxiety disorder: Secondary | ICD-10-CM

## 2023-12-19 DIAGNOSIS — E782 Mixed hyperlipidemia: Secondary | ICD-10-CM | POA: Diagnosis not present

## 2023-12-19 DIAGNOSIS — Z599 Problem related to housing and economic circumstances, unspecified: Secondary | ICD-10-CM

## 2023-12-19 DIAGNOSIS — E1142 Type 2 diabetes mellitus with diabetic polyneuropathy: Secondary | ICD-10-CM

## 2023-12-19 DIAGNOSIS — F331 Major depressive disorder, recurrent, moderate: Secondary | ICD-10-CM

## 2023-12-19 DIAGNOSIS — E1140 Type 2 diabetes mellitus with diabetic neuropathy, unspecified: Secondary | ICD-10-CM

## 2023-12-19 MED ORDER — TIRZEPATIDE 2.5 MG/0.5ML ~~LOC~~ SOAJ
2.5000 mg | SUBCUTANEOUS | 0 refills | Status: DC
  Filled 2023-12-19 (×2): qty 2, 28d supply, fill #0

## 2023-12-19 MED ORDER — VITAMIN D3 25 MCG (1000 UNIT) PO TABS
1000.0000 [IU] | ORAL_TABLET | Freq: Every day | ORAL | 2 refills | Status: AC
  Filled 2023-12-19: qty 100, 100d supply, fill #0
  Filled 2024-03-25: qty 100, 100d supply, fill #1

## 2023-12-19 MED ORDER — VITAMIN B-12 1000 MCG PO TABS
1000.0000 ug | ORAL_TABLET | Freq: Every day | ORAL | 1 refills | Status: AC
  Filled 2023-12-19: qty 100, 100d supply, fill #0
  Filled 2023-12-19: qty 100, 90d supply, fill #0
  Filled 2024-03-25: qty 100, 100d supply, fill #1

## 2023-12-19 MED ORDER — ATORVASTATIN CALCIUM 10 MG PO TABS
10.0000 mg | ORAL_TABLET | Freq: Every day | ORAL | 3 refills | Status: AC
  Filled 2023-12-19 (×2): qty 90, 90d supply, fill #0
  Filled 2024-03-16: qty 90, 90d supply, fill #1

## 2023-12-19 MED ORDER — ACCU-CHEK GUIDE W/DEVICE KIT
1.0000 | PACK | Freq: Three times a day (TID) | 0 refills | Status: DC
  Filled 2023-12-19 (×2): qty 1, 30d supply, fill #0

## 2023-12-19 MED ORDER — GABAPENTIN 400 MG PO CAPS
800.0000 mg | ORAL_CAPSULE | Freq: Every day | ORAL | 5 refills | Status: DC
Start: 2023-12-19 — End: 2024-01-09
  Filled 2023-12-19 (×2): qty 60, 30d supply, fill #0

## 2023-12-19 NOTE — Assessment & Plan Note (Signed)
See above - depression

## 2023-12-19 NOTE — Assessment & Plan Note (Signed)
 Supplement and monitor

## 2023-12-19 NOTE — Assessment & Plan Note (Signed)
 Refill gabapentin . Follow-up with Neurology.

## 2023-12-19 NOTE — Assessment & Plan Note (Signed)
No current supplement  Labs today

## 2023-12-19 NOTE — Assessment & Plan Note (Signed)
 Hyperlipidemia managed with Lipitor 10 mg. Medication compliance encouraged.  - Refill Lipitor prescription.

## 2023-12-19 NOTE — Assessment & Plan Note (Signed)
 Poor glycemic control due to medication lapse and poor diet.  - Send prescription for Mounjaro  2.5 mg weekly  - Order new glucometer. - Order blood work for glucose control and kidney function.

## 2023-12-19 NOTE — Progress Notes (Signed)
 Established Patient Office Visit  Subjective   [name], female    DOB: [birth year]  Age: 43 y.o. MRN: [id number]  CC:  follow-up, chronic disease management - refills    HPI  Patient is here for routine follow-up.   Discussed the use of AI scribe software for clinical note transcription with the patient, who gave verbal consent to proceed.  History of Present Illness Colleen Downs is a 43 year old female who presents for a regular follow-up visit.  She is experiencing significant financial difficulties, which are impacting her ability to afford basic necessities, including food and medication. Her parents have been assisting her financially. She has not recertified for food stamps and is unsure about the current application process. She struggles with overwhelming bills and is unsure how to prioritize them.  She has not been taking her vitamin D  or B12 supplements and has been out of medication since the last appointment 3 months ago.  She attempted to contact a therapy provider she was referred to but faced difficulties in scheduling an appointment due to communication issues and staff being out of town. She completed the necessary paperwork but did not progress beyond that stage.  Her last A1c was 7.3, and she has been out of medication since the last appointment. She inquires about getting her prescriptions filled at the clinic's pharmacy.    DIABETES: - Checking BG at home: no - Medications: Mounjaro  2.5 mg/week - Compliance: has not taken in 3 months - Diet: low carb, mostly vegetarian - Exercise: walking >5k steps/day - Eye exam: referral placed last visit  - Foot exam: today - Microalbumin: UTD - Denies symptoms of hypoglycemia, polyuria, polydipsia, foot ulcers/trauma, wounds that are not healing, medication side effects  - taking gabapentin  at bedtime for neuropathy (following with Neurology)   Lab Results  Component Value Date   HGBA1C 7.3 (H)  09/18/2023   Wt Readings from Last 3 Encounters:  12/19/23 240 lb (108.9 kg)  09/18/23 232 lb (105.2 kg)  06/19/23 231 lb 12.8 oz (105.1 kg)    HYPERLIPIDEMIA - medications: atorvastatin  10 mg daily - compliance: not taken in 3 months - medication SEs: none The 10-year ASCVD risk score (Arnett DK, et al., 2019) is: 2.9%   Values used to calculate the score:     Age: 70 years     Clincally relevant sex: Female     Is Non-Hispanic African American: Yes     Diabetic: Yes     Tobacco smoker: No     Systolic Blood Pressure: 137 mmHg     Is BP treated: No     HDL Cholesterol: 54.9 mg/dL     Total Cholesterol: 211 mg/dL   Vitamin D  Deficiency: - not taking supplement  Vitamin B12 deficiency: - Neurology has recommended she start B12 1,000 mcg daily. Not taking.        12/19/2023    2:25 PM 02/28/2023    2:33 PM 09/13/2021    1:48 PM  PHQ9 SCORE ONLY  PHQ-9 Total Score 11 4  16       Data saved with a previous flowsheet row definition      12/19/2023    2:25 PM 02/28/2023    2:34 PM 02/05/2022    1:48 PM 09/13/2021    1:48 PM  GAD 7 : Generalized Anxiety Score  Nervous, Anxious, on Edge 2 1 0 1  Control/stop worrying 2 1 1 2   Worry too much - different things  2 1 1 2   Trouble relaxing 1 1 0 1  Restless 0 0 0 0  Easily annoyed or irritable 2 1 1 2   Afraid - awful might happen 2 1 0 2  Total GAD 7 Score 11 6 3 10   Anxiety Difficulty Somewhat difficult Somewhat difficult Not difficult at all Somewhat difficult       ROS All review of systems negative except what is listed in the HPI    Objective:     BP 137/71   Pulse 70   Ht 5' 6 (1.676 m)   Wt 240 lb (108.9 kg)   SpO2 100%   BMI 38.74 kg/m    Physical Exam Vitals reviewed.  Constitutional:      General: She is not in acute distress.    Appearance: Normal appearance. She is obese. She is not ill-appearing.  Cardiovascular:     Rate and Rhythm: Normal rate and regular rhythm.  Pulmonary:      Effort: Pulmonary effort is normal.     Breath sounds: Normal breath sounds.  Musculoskeletal:        General: Normal range of motion.     Cervical back: Normal range of motion and neck supple.  Skin:    General: Skin is warm and dry.  Neurological:     General: No focal deficit present.     Mental Status: She is alert and oriented to person, place, and time. Mental status is at baseline.  Psychiatric:        Mood and Affect: Mood normal.        Behavior: Behavior normal.        Thought Content: Thought content normal.        Judgment: Judgment normal.      No results found for any visits on 12/19/23.     The 10-year ASCVD risk score (Arnett DK, et al., 2019) is: 2.9%    Assessment & Plan:    Problem List Items Addressed This Visit       Active Problems   Type 2 diabetes mellitus with morbid obesity (HCC) - Primary   Poor glycemic control due to medication lapse and poor diet.  - Send prescription for Mounjaro  2.5 mg weekly  - Order new glucometer. - Order blood work for glucose control and kidney function.       Relevant Medications   atorvastatin  (LIPITOR) 10 MG tablet   tirzepatide  (MOUNJARO ) 2.5 MG/0.5ML Pen   Blood Glucose Monitoring Suppl (ACCU-CHEK GUIDE) w/Device KIT   Other Relevant Orders   Hemoglobin A1c   AMB Referral VBCI Care Management   Basic metabolic panel with GFR   Vitamin D  deficiency   No current supplement. Labs today       Relevant Medications   cholecalciferol (VITAMIN D3) 25 MCG (1000 UNIT) tablet   Other Relevant Orders   VITAMIN D  25 Hydroxy (Vit-D Deficiency, Fractures)   Mixed hyperlipidemia   Hyperlipidemia managed with Lipitor 10 mg. Medication compliance encouraged.  - Refill Lipitor prescription.      Relevant Medications   atorvastatin  (LIPITOR) 10 MG tablet   Major depression   Worsening symptoms with stressors. Prefers counseling over medication. Poor diet may affect mood. No SI/HI. - Patient states she will call  back office that tried to make referral appt.       B12 deficiency   Supplement and monitor.       Relevant Medications   cyanocobalamin  (VITAMIN B12) 1000 MCG tablet   Diabetic  polyneuropathy associated with type 2 diabetes mellitus (HCC)   Refill gabapentin . Follow-up with Neurology.       Relevant Medications   atorvastatin  (LIPITOR) 10 MG tablet   gabapentin  (NEURONTIN ) 400 MG capsule   tirzepatide  (MOUNJARO ) 2.5 MG/0.5ML Pen   GAD (generalized anxiety disorder)   See above (depression)      Type 2 diabetes mellitus with diabetic neuropathy, without long-term current use of insulin  (HCC)   See other      Relevant Medications   atorvastatin  (LIPITOR) 10 MG tablet   gabapentin  (NEURONTIN ) 400 MG capsule   tirzepatide  (MOUNJARO ) 2.5 MG/0.5ML Pen   Other Visit Diagnoses       Financial difficulties       Relevant Orders   AMB Referral VBCI Care Management                Return in about 3 months (around 03/20/2024) for chronic disease management.     Waddell KATHEE Mon, NP

## 2023-12-19 NOTE — Assessment & Plan Note (Signed)
 Worsening symptoms with stressors. Prefers counseling over medication. Poor diet may affect mood. No SI/HI. - Patient states she will call back office that tried to make referral appt.

## 2023-12-19 NOTE — Assessment & Plan Note (Signed)
 See other

## 2023-12-20 ENCOUNTER — Other Ambulatory Visit (HOSPITAL_BASED_OUTPATIENT_CLINIC_OR_DEPARTMENT_OTHER): Payer: Self-pay

## 2023-12-20 ENCOUNTER — Other Ambulatory Visit: Payer: Self-pay

## 2023-12-20 ENCOUNTER — Ambulatory Visit: Payer: Self-pay | Admitting: Family Medicine

## 2023-12-20 DIAGNOSIS — E559 Vitamin D deficiency, unspecified: Secondary | ICD-10-CM

## 2023-12-20 LAB — BASIC METABOLIC PANEL WITH GFR
BUN: 8 mg/dL (ref 6–23)
CO2: 24 meq/L (ref 19–32)
Calcium: 8.8 mg/dL (ref 8.4–10.5)
Chloride: 101 meq/L (ref 96–112)
Creatinine, Ser: 0.72 mg/dL (ref 0.40–1.20)
GFR: 102.66 mL/min (ref >60.00)
Glucose, Bld: 184 mg/dL — ABNORMAL HIGH (ref 70–99)
Potassium: 3.7 meq/L (ref 3.5–5.1)
Sodium: 135 meq/L (ref 135–145)

## 2023-12-20 LAB — VITAMIN D 25 HYDROXY (VIT D DEFICIENCY, FRACTURES): VITD: 14.35 ng/mL — ABNORMAL LOW (ref 30.00–100.00)

## 2023-12-20 LAB — HEMOGLOBIN A1C: Hgb A1c MFr Bld: 8.1 % — ABNORMAL HIGH (ref 4.6–6.5)

## 2023-12-20 MED ORDER — VITAMIN D (ERGOCALCIFEROL) 1.25 MG (50000 UNIT) PO CAPS
50000.0000 [IU] | ORAL_CAPSULE | ORAL | 1 refills | Status: AC
  Filled 2023-12-20: qty 12, 84d supply, fill #0
  Filled 2024-02-05 – 2024-03-13 (×2): qty 12, 84d supply, fill #1

## 2023-12-23 ENCOUNTER — Other Ambulatory Visit (HOSPITAL_BASED_OUTPATIENT_CLINIC_OR_DEPARTMENT_OTHER): Payer: Self-pay

## 2023-12-25 ENCOUNTER — Telehealth: Payer: Self-pay

## 2023-12-25 NOTE — Progress Notes (Unsigned)
 Complex Care Management Note Care Guide Note  12/25/2023 Name: Colleen Downs MRN: [id number] DOB: January 24, 1981   Complex Care Management Outreach Attempts: A second unsuccessful outreach was attempted today to offer the patient with information about available complex care management services.  Follow Up Plan:  Additional outreach attempts will be made to offer the patient complex care management information and services.   Encounter Outcome:  No Answer  Dreama [facility]  [facility], [facility] VBCI Assistant Direct Dial: [phone]  Fax: [phone]

## 2023-12-27 ENCOUNTER — Telehealth: Payer: Self-pay | Admitting: Family Medicine

## 2023-12-27 ENCOUNTER — Other Ambulatory Visit (HOSPITAL_BASED_OUTPATIENT_CLINIC_OR_DEPARTMENT_OTHER): Payer: Self-pay

## 2023-12-27 MED ORDER — LANCET DEVICE MISC
0 refills | Status: DC
  Filled 2023-12-27: qty 1, fill #0

## 2023-12-27 NOTE — Telephone Encounter (Signed)
 Copied from CRM [id number]. Topic: Clinical - Medication Question >> Dec 27, 2023  9:56 AM Colleen Downs wrote: Reason for CRM: Patient's pen is not working correctly. She has her needles, and strips, and meter. But, the issue is the pen is not pricking her finger correctly when she put the needle in. It pops off. Patient is requesting if she can get a new pen.    [phone] Colleen Downs)  [facility] - [facility] [address] [place] [address] Phone: [phone] Fax: [phone] Hours: M-F 7:30a-7:00p

## 2023-12-27 NOTE — Telephone Encounter (Signed)
 Spoke w/ Pt- she broke the tip off of her lancet device. Prescription for new device sent downstairs

## 2023-12-27 NOTE — Progress Notes (Signed)
 Complex Care Management Note Care Guide Note  12/27/2023 Name: Colleen Downs MRN: [id number] DOB: Jul 15, 1980   Complex Care Management Outreach Attempts: A third unsuccessful outreach was attempted today to offer the patient with information about available complex care management services.  Follow Up Plan:  No further outreach attempts will be made at this time. We have been unable to contact the patient to offer or enroll patient in complex care management services.  Encounter Outcome:  No Answer  Dreama [facility]  [facility], [facility] VBCI Assistant Direct Dial: [phone]  Fax: [phone]
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
## 2024-01-01 ENCOUNTER — Encounter: Payer: Self-pay | Admitting: Student

## 2024-01-01 ENCOUNTER — Ambulatory Visit: Admitting: Student

## 2024-01-01 VITALS — BP 132/85 | HR 69 | Ht 66.0 in | Wt 238.2 lb

## 2024-01-01 DIAGNOSIS — L989 Disorder of the skin and subcutaneous tissue, unspecified: Secondary | ICD-10-CM | POA: Diagnosis not present

## 2024-01-01 DIAGNOSIS — F1290 Cannabis use, unspecified, uncomplicated: Secondary | ICD-10-CM | POA: Diagnosis not present

## 2024-01-01 DIAGNOSIS — L905 Scar conditions and fibrosis of skin: Secondary | ICD-10-CM | POA: Diagnosis not present

## 2024-01-01 DIAGNOSIS — D489 Neoplasm of uncertain behavior, unspecified: Secondary | ICD-10-CM

## 2024-01-01 MED ORDER — ONDANSETRON HCL 4 MG PO TABS: 4.0000 mg | ORAL_TABLET | Freq: Three times a day (TID) | ORAL | 0 refills | Status: DC | PRN

## 2024-01-01 NOTE — Progress Notes (Signed)
 [name], female    DOB: 01/31/1981, 43 y.o.   MRN: [id number]  Chief Complaint  Patient presents with   Pre-op Exam    reop:Excision of 4 skin lesions /ins/sx/11-25/mcsc/1:30pm       ICD-10-CM   1. Neoplasm, uncertain whether benign or malignant  D48.9        History of Present Illness: Colleen Downs is a 43 y.o.  female  with a history of skin lesions and scar.  She presents for preoperative evaluation for upcoming procedure, excision of 4 lesions to the left face, excision of 1 skin lesion to the left posterior shoulder and injection of scar to the right shoulder, scheduled for 01/21/2024 with Dr. Waddell.  The patient reports that she has had difficulty waking up from anesthesia in the past.  She states that she was unsure why and cannot elaborate further on this.  She otherwise denies any issues with anesthesia.  Patient denies any history of cardiac disease.  She denies taking any blood thinners.  Patient does report she smokes weed and sometimes rolls the weed and tobacco.  She denies smoking any cigarettes.  She denies any other use of nicotine or tobacco products.  Patient reports today that her most recent A1c was 8.1 on December 19, 2023.  She states that she was not on her medications for a little while, but is now on them again.  Patient states that she is okay to use paper tape, but she does have reactions to other types of tape.  She states that she does not have any issues with Dermabond  Patient denies taking any birth control or hormone replacement.  She denies any history of miscarriages.  She denies any personal family history of blood clots or clotting diseases.  She denies any recent surgeries, traumas or infections.  She denies any history of stroke or heart attack.  She denies any history of Crohn's disease or ulcerative colitis, COPD or asthma.  She denies any history of cancer.  She denies any varicosities to her lower extremities.  She denies any  recent fevers, chills or changes in her health.  Summary of Previous Visit: Patient was seen for initial consult by Dr. Waddell on 03/13/2023.  At this visit, patient complained of a swelling or cyst in the preauricular area of the left ear.  She reported that it had been there for many years and was occasionally uncomfortable when she slept on it.  She also reported that she occasionally had bloody drainage from the area.  Patient presented for consult to have it removed.  Patient also reported that she had small skin lesion behind her left ear as well.  It was noted that this was most likely an epidermal inclusion cyst however the presentation location were somewhat unusual.  An ultrasound was ordered to evaluate prior to scheduling.  Patient was then seen again in the clinic by Dr. Waddell on 10/30/2023.  At this visit, it was noted that patient underwent ultrasound which confirmed that the mass to the left preauricular area was a well-circumscribed cyst consistent with epidermal inclusion cyst.  Patient at this visit also complained of several new lesions all consistent with epidermal inclusion cyst and a scar to the right shoulder that she wanted addressed.  Plan was to move forward with excision of the cysts and injection of the right shoulder scar in the operating room.  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  PMH Significant for: Type 2 diabetes, skin lesions   Past Medical History: Allergies: Allergies  Allergen Reactions   Sulfa Drugs Cross Reactors Anaphylaxis   Latex     Current Medications:  Current Outpatient Medications:    Accu-Chek Softclix Lancets lancets, Use to test blood sugar 3 times daily, in the morning, at noon, and at bedtime., Disp: 100 each, Rfl: 0   atorvastatin  (LIPITOR) 10 MG tablet, Take 1 tablet (10 mg total) by mouth daily., Disp: 90 tablet, Rfl: 3   Blood Glucose Monitoring Suppl (ACCU-CHEK  GUIDE) w/Device KIT, Use  in the morning, at noon, and at bedtime., Disp: 1 kit, Rfl: 0   cholecalciferol (VITAMIN D3) 25 MCG (1000 UNIT) tablet, Take 1 tablet (1,000 Units total) by mouth daily., Disp: 100 tablet, Rfl: 2   cyanocobalamin  (VITAMIN B12) 1000 MCG tablet, Take 1 tablet (1,000 mcg total) by mouth daily., Disp: 100 tablet, Rfl: 1   gabapentin  (NEURONTIN ) 400 MG capsule, Take 2 capsules (800 mg total) by mouth at bedtime., Disp: 60 capsule, Rfl: 5   Glucose Blood (BLOOD GLUCOSE TEST STRIPS) STRP, Use 1 (one) as directed in the morning, at noon, and at bedtime., Disp: 100 strip, Rfl: 0   Lancet Device MISC, To use to check blood sugars. Accu-chek lancet device, Disp: 1 each, Rfl: 0   ondansetron  (ZOFRAN ) 4 MG tablet, Take 1 tablet (4 mg total) by mouth every 8 (eight) hours as needed for up to 10 doses for nausea or vomiting., Disp: 10 tablet, Rfl: 0   tirzepatide  (MOUNJARO ) 2.5 MG/0.5ML Pen, Inject 2.5 mg into the skin once a week., Disp: 2 mL, Rfl: 0   Vitamin D , Ergocalciferol , (DRISDOL ) 1.25 MG (50000 UNIT) CAPS capsule, Take 1 capsule (50,000 Units total) by mouth every 7 (seven) days., Disp: 12 capsule, Rfl: 1  Past Medical Problems: Past Medical History:  Diagnosis Date   Diabetes mellitus without complication (HCC)    Diabetic neuropathy (HCC)    Diarrhea    High cholesterol    Irregular periods    Severe acute respiratory syndrome coronavirus 2 (SARS-CoV-2) vaccination declined 09/03/2019   IMO SNOMED Dx Update Oct 2024      Past Surgical History: Past Surgical History:  Procedure Laterality Date   CHOLECYSTECTOMY      Social History: Social History   Socioeconomic History   Marital status: Single    Spouse name: Not on file   Number of children: Not on file   Years of education: Not on file   Highest education level: Not on file  Occupational History   Not on file  Tobacco Use   Smoking status: Former    Types: Cigarettes   Smokeless tobacco: Never    Tobacco comments:    Has periods that she smokes for a little while and then stops for a while.  10/12/2021 hfb  12/7/23cigars one or two a day  Vaping Use   Vaping status: Never Used  Substance and Sexual Activity   Alcohol use: Yes    Comment: social   Drug use: Yes    Types: Marijuana   Sexual activity: Not Currently    Birth control/protection: Condom  Other Topics Concern   Not on file  Social History Narrative   Are you right handed or left handed? Right   Are you currently employed ?    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 nieces  Who lives with you?    What type of home do you live in: 1 story or 2 story? two    Caffeine rarely 1 or 2 a week   Social Drivers of Corporate Investment Banker Strain: Unknown (04/18/2021)   Received from [facility]   Overall Financial Resource Strain (CARDIA)    Difficulty of Paying Living Expenses: Patient declined  Food Insecurity: Low Risk  (09/18/2022)   Received from [facility]   Hunger Vital Sign    Within the past 12 months, you worried that your food would run out before you got money to buy more: Never true    Within the past 12 months, the food you bought just didn't last and you didn't have money to get more. : Never true  Transportation Needs: Not on file (09/18/2022)  Recent Concern: Transportation Needs - Unmet Transportation Needs (09/18/2022)   Received from [facility]    In the past 12 months, has lack of reliable transportation kept you from medical appointments, meetings, work or from getting things needed for daily living? : Yes  Physical Activity: Sufficiently Active (04/18/2021)   Received from [facility]   Exercise Vital Sign    On average, how many days per week do you engage in moderate to strenuous exercise (like a brisk walk)?: 5 days    On average, how many minutes do you engage in exercise at this level?: 60 min  Stress: Unknown (04/18/2021)    Received from [facility] - Occupational Stress Questionnaire    Feeling of Stress : Patient declined  Social Connections: Moderately Integrated (04/18/2021)   Received from [facility]   Social Connection and Isolation Panel    In a typical week, how many times do you talk on the phone with family, friends, or neighbors?: More than three times a week    How often do you get together with friends or relatives?: More than three times a week    How often do you attend church or religious services?: 1 to 4 times per year    Do you belong to any clubs or organizations such as church groups, unions, fraternal or athletic groups, or school groups?: Yes    How often do you attend meetings of the clubs or organizations you belong to?: 1 to 4 times per year    Are you married, widowed, divorced, separated, never married, or living with a partner?: Never married  Intimate Partner Violence: Unknown (04/18/2021)   Received from [facility], Afraid, Rape, and Kick questionnaire    Within the last year, have you been afraid of your partner or ex-partner?: Patient declined    Within the last year, have you been humiliated or emotionally abused in other ways by your partner or ex-partner?: Patient declined    Within the last year, have you been kicked, hit, slapped, or otherwise physically hurt by your partner or ex-partner?: No    Within the last year, have you been raped or forced to have any kind of sexual activity by your partner or ex-partner?: No    Family History: Family History  Problem Relation Age of Onset   Rheum arthritis Mother    Rheum arthritis Sister    Arthritis Maternal Uncle    Arthritis Paternal Uncle    Diabetes Maternal Grandmother     Review of Systems: Denies any recent fevers, chills or changes in health  Physical Exam: Vital Signs BP 132/85   Pulse 69   Ht 5' 6 (1.676 m)   Wt 238 lb 3.2 oz (108 kg)   BMI  38.45 kg/m   Physical Exam  Constitutional:      General: Not in acute distress.    Appearance: Normal appearance. Not ill-appearing.  HENT:     Head: Normocephalic and atraumatic.  Eyes:     Pupils: Pupils are equal, round Neck:     Musculoskeletal: Normal range of motion.  Cardiovascular:     Rate and Rhythm: Normal rate Pulmonary:     Effort: Pulmonary effort is normal. No respiratory distress.  Abdominal:     General: Abdomen is flat. There is no distension.  Musculoskeletal: Normal range of motion.  Skin:    General: Skin is warm and dry.     Findings: No erythema or rash.  Neurological:     Mental Status: Alert and oriented to person, place, and time. Mental status is at baseline.  Psychiatric:        Mood and Affect: Mood normal.        Behavior: Behavior normal.    Assessment/Plan: The patient is scheduled for excision of skin lesions to the left side of her face, excision of skin lesion to left posterior shoulder and injection of scar to the right shoulder with Dr. Waddell.  Risks, benefits, and alternatives of procedure discussed, questions answered and consent obtained.    Smoking Status: Yes, marijuana; Counseling Given?  Discussed with the patient that she should avoid using any nicotine or tobacco products as this can delay/inhibit wound healing.  Patient expressed understanding.  I did discuss with her that I will have to discuss this with Dr. Waddell and this could potentially delay her surgery.  She expressed understanding. -I did discuss this and patient's A1c with Dr. Waddell and he is okay with proceeding with surgery.    Caprini Score: 4; Risk Factors include: Age, BMI > 25, and length of planned surgery. Recommendation for mechanical prophylaxis. Encourage early ambulation.   Pictures obtained: @consult   Post-op Rx sent to pharmacy: Patient declined oxycodone as she has had severe constipation in the past due to the oxycodone, she we will plan on using  Tylenol  and ibuprofen  for her pain after surgery.  I will send her in some Zofran   I instructed the patient to hold any multivitamins or supplements at least 1 week prior to surgery.  Discussed with her to hold her Mounjaro  at least 1 week prior to surgery.  Patient's breast understanding.  Patient was provided with the General Surgical Risk consent document and Pain Medication Agreement prior to their appointment.  They had adequate time to read through the risk consent documents and Pain Medication Agreement. We also discussed them in person together during this preop appointment. All of their questions were answered to their satisfaction.  Recommended calling if they have any further questions.  Risk consent form and Pain Medication Agreement to be scanned into patient's chart.  The consent was obtained with risks and complications reviewed which included bleeding, pain, scar, infection and the risk of anesthesia.  We also discussed risks of Kenalog  injection including hypopigmentation and skin thinning.  The patients questions were answered to the patients expressed satisfaction.   I discussed with the patient that she is at high risk of wound healing complications given history of tobacco use and elevated A1c.  I encouraged the patient to continue to work on her sugars and  encouraged cessation of tobacco/nicotine products. She expressed understanding. Patient reports that she is healed from previous surgeries without any difficulties.   Electronically signed by: Estefana FORBES Peck, PA-C 01/01/2024 2:53 PM

## 2024-01-09 ENCOUNTER — Ambulatory Visit (INDEPENDENT_AMBULATORY_CARE_PROVIDER_SITE_OTHER): Admitting: Neurology

## 2024-01-09 ENCOUNTER — Other Ambulatory Visit (HOSPITAL_BASED_OUTPATIENT_CLINIC_OR_DEPARTMENT_OTHER): Payer: Self-pay

## 2024-01-09 ENCOUNTER — Other Ambulatory Visit: Payer: Self-pay | Admitting: Family Medicine

## 2024-01-09 ENCOUNTER — Encounter: Payer: Self-pay | Admitting: Neurology

## 2024-01-09 VITALS — BP 120/84 | HR 73 | Ht 66.0 in | Wt 237.0 lb

## 2024-01-09 DIAGNOSIS — E1142 Type 2 diabetes mellitus with diabetic polyneuropathy: Secondary | ICD-10-CM

## 2024-01-09 DIAGNOSIS — E538 Deficiency of other specified B group vitamins: Secondary | ICD-10-CM

## 2024-01-09 DIAGNOSIS — E559 Vitamin D deficiency, unspecified: Secondary | ICD-10-CM

## 2024-01-09 MED ORDER — GABAPENTIN 400 MG PO CAPS
800.0000 mg | ORAL_CAPSULE | Freq: Every day | ORAL | 11 refills | Status: AC
  Filled 2024-01-09 – 2024-02-05 (×3): qty 60, 30d supply, fill #0
  Filled 2024-03-06: qty 60, 30d supply, fill #1

## 2024-01-09 MED ORDER — LANCET DEVICE MISC
0 refills | Status: DC
  Filled 2024-01-09 – 2024-01-20 (×2): qty 1, 30d supply, fill #0

## 2024-01-09 NOTE — Telephone Encounter (Signed)
 Copied from CRM #[id number]. Topic: Clinical - Medication Refill >> Jan 09, 2024 11:54 AM Charolett L wrote: Medication: Lancet Device MISC   Has the patient contacted their pharmacy? Yes (Agent: If no, request that the patient contact the pharmacy for the refill. If patient does not wish to contact the pharmacy document the reason why and proceed with request.) (Agent: If yes, when and what did the pharmacy advise?)  This is the patient's preferred pharmacy:   [facility] [place] [facility] [address] [place] [address] Phone: [phone] Fax: [phone]  Is this the correct pharmacy for this prescription? Yes If no, delete pharmacy and type the correct one.   Has the prescription been filled recently? Yes  Is the patient out of the medication? Yes  Has the patient been seen for an appointment in the last year OR does the patient have an upcoming appointment? Yes  Can we respond through MyChart? Yes  Agent: Please be advised that Rx refills may take up to 3 business days. We ask that you follow-up with your pharmacy.

## 2024-01-09 NOTE — Patient Instructions (Addendum)
 -Continue gabapentin  800 mg at bedtime  -Continue B12 1000 mcg daily  -Continue vitamin D  supplementation  -Discussed the importance of foot care and inspection daily  I will see you again in 6 months. Please let me know if you have any questions or concerns in the meantime.  The physicians and staff at [facility] are committed to providing excellent care. You may receive a survey requesting feedback about your experience at our office. We strive to receive very good responses to the survey questions. If you feel that your experience would prevent you from giving the office a very good  response, please contact our office to try to remedy the situation. We may be reached at [phone]. Thank you for taking the time out of your busy day to complete the survey.  Colleen Potters, MD [facility]  Preventing Falls at [facility] are common, often dreaded events in the lives of older people. Aside from the obvious injuries and even death that may result, fall can cause wide-ranging consequences including loss of independence, mental decline, decreased activity and mobility. Younger people are also at risk of falling, especially those with chronic illnesses and fatigue.  Ways to reduce risk for falling Examine diet and medications. Warm foods and alcohol dilate blood vessels, which can lead to dizziness when standing. Sleep aids, antidepressants and pain medications can also increase the likelihood of a fall.  Get a vision exam. Poor vision, cataracts and glaucoma increase the chances of falling.  Check foot gear. Shoes should fit snugly and have a sturdy, nonskid sole and a broad, low heel  Participate in a physician-approved exercise program to build and maintain muscle strength and improve balance and coordination. Programs that use ankle weights or stretch bands are excellent for muscle-strengthening. Water aerobics programs and low-impact Tai Chi programs have also been shown  to improve balance and coordination.  Increase vitamin D  intake. Vitamin D  improves muscle strength and increases the amount of calcium  the body is able to absorb and deposit in bones.  How to prevent falls from common hazards Floors - Remove all loose wires, cords, and throw rugs. Minimize clutter. Make sure rugs are anchored and smooth. Keep furniture in its usual place.  Chairs -- Use chairs with straight backs, armrests and firm seats. Add firm cushions to existing pieces to add height.  Bathroom - Install grab bars and non-skid tape in the tub or shower. Use a bathtub transfer bench or a shower chair with a back support Use an elevated toilet seat and/or safety rails to assist standing from a low surface. Do not use towel racks or bathroom tissue holders to help you stand.  Lighting - Make sure halls, stairways, and entrances are well-lit. Install a night light in your bathroom or hallway. Make sure there is a light switch at the top and bottom of the staircase. Turn lights on if you get up in the middle of the night. Make sure lamps or light switches are within reach of the bed if you have to get up during the night.  Kitchen - Install non-skid rubber mats near the sink and stove. Clean spills immediately. Store frequently used utensils, pots, pans between waist and eye level. This helps prevent reaching and bending. Sit when getting things out of lower cupboards.  Living room/ Bedrooms - Place furniture with wide spaces in between, giving enough room to move around. Establish a route through the living room that gives you something to hold onto as  you walk.  Stairs - Make sure treads, rails, and rugs are secure. Install a rail on both sides of the stairs. If stairs are a threat, it might be helpful to arrange most of your activities on the lower level to reduce the number of times you must climb the stairs.  Entrances and doorways - Install metal handles on the walls adjacent to the  doorknobs of all doors to make it more secure as you travel through the doorway.  Tips for maintaining balance Keep at least one hand free at all times. Try using a backpack or fanny pack to hold things rather than carrying them in your hands. Never carry objects in both hands when walking as this interferes with keeping your balance.  Attempt to swing both arms from front to back while walking. This might require a conscious effort if Parkinson's disease has diminished your movement. It will, however, help you to maintain balance and posture, and reduce fatigue.  Consciously lift your feet off of the ground when walking. Shuffling and dragging of the feet is a common culprit in losing your balance.  When trying to navigate turns, use a U technique of facing forward and making a wide turn, rather than pivoting sharply.  Try to stand with your feet shoulder-length apart. When your feet are close together for any length of time, you increase your risk of losing your balance and falling.  Do one thing at a time. Don't try to walk and accomplish another task, such as reading or looking around. The decrease in your automatic reflexes complicates motor function, so the less distraction, the better.  Do not wear rubber or gripping soled shoes, they might catch on the floor and cause tripping.  Move slowly when changing positions. Use deliberate, concentrated movements and, if needed, use a grab bar or walking aid. Count 15 seconds between each movement. For example, when rising from a seated position, wait 15 seconds after standing to begin walking.  If balance is a continuous problem, you might want to consider a walking aid such as a cane, walking stick, or walker. Once you've mastered walking with help, you might be ready to try it on your own again.

## 2024-01-14 ENCOUNTER — Other Ambulatory Visit: Payer: Self-pay

## 2024-01-14 ENCOUNTER — Encounter (HOSPITAL_BASED_OUTPATIENT_CLINIC_OR_DEPARTMENT_OTHER): Payer: Self-pay | Admitting: Plastic Surgery

## 2024-01-14 NOTE — Progress Notes (Signed)
   01/14/24 1119  PAT Phone Screen  Is the patient taking a GLP-1 receptor agonist? Yes  Has the patient been informed on holding medication? Yes (11/16 last dose, will hold dose this Sunday)  Do You Have Diabetes? Yes  Do You Have Hypertension? No  Have You Ever Been to the ER for Asthma? No  Have You Taken Oral Steroids in the Past 3 Months? No  Do you Take Phenteramine or any Other Diet Drugs? No  Recent  Lab Work, EKG, CXR? No  Do you have a history of heart problems? No  Any Recent Hospitalizations? No  Height 5' 6 (1.676 m)  Weight 107.5 kg  Pat Appointment Scheduled Yes (BMET EKG)

## 2024-01-20 ENCOUNTER — Other Ambulatory Visit: Payer: Self-pay | Admitting: Family Medicine

## 2024-01-20 ENCOUNTER — Encounter (HOSPITAL_BASED_OUTPATIENT_CLINIC_OR_DEPARTMENT_OTHER)
Admission: RE | Admit: 2024-01-20 | Discharge: 2024-01-20 | Disposition: A | Discharge status: Home / Self Care | Source: Ambulatory Visit | Attending: Plastic Surgery | Admitting: Plastic Surgery

## 2024-01-20 ENCOUNTER — Other Ambulatory Visit (HOSPITAL_BASED_OUTPATIENT_CLINIC_OR_DEPARTMENT_OTHER): Payer: Self-pay

## 2024-01-20 DIAGNOSIS — E089 Diabetes mellitus due to underlying condition without complications: Secondary | ICD-10-CM | POA: Diagnosis not present

## 2024-01-20 DIAGNOSIS — Z01818 Encounter for other preprocedural examination: Secondary | ICD-10-CM | POA: Insufficient documentation

## 2024-01-20 DIAGNOSIS — E1169 Type 2 diabetes mellitus with other specified complication: Secondary | ICD-10-CM

## 2024-01-20 LAB — BASIC METABOLIC PANEL WITH GFR
Anion gap: 8 (ref 5–15)
BUN: 6 mg/dL (ref 6–20)
CO2: 26 mmol/L (ref 22–32)
Calcium: 8.6 mg/dL — ABNORMAL LOW (ref 8.9–10.3)
Chloride: 101 mmol/L (ref 98–111)
Creatinine, Ser: 1.02 mg/dL — ABNORMAL HIGH (ref 0.44–1.00)
GFR, Estimated: >60 mL/min (ref >60)
Glucose, Bld: 101 mg/dL — ABNORMAL HIGH (ref 70–99)
Potassium: 4.1 mmol/L (ref 3.5–5.1)
Sodium: 135 mmol/L (ref 135–145)

## 2024-01-20 NOTE — Progress Notes (Signed)

## 2024-01-20 NOTE — Anesthesia Preprocedure Evaluation (Signed)
 Anesthesia Evaluation    Airway        Dental   Pulmonary former smoker          Cardiovascular   HLD   Neuro/Psych  PSYCHIATRIC DISORDERS Anxiety Depression Bipolar Disorder    Neuromuscular disease (neuropathy)    GI/Hepatic ,,,(+)     substance abuse  marijuana use  Endo/Other  diabetes, Type 2    Renal/GU      Musculoskeletal   Abdominal   Peds  Hematology   Anesthesia Other Findings Last Mounjaro :  Reproductive/Obstetrics                              Anesthesia Physical Anesthesia Plan  ASA: 2  Anesthesia Plan: General   Post-op Pain Management: Tylenol  PO (pre-op)*   Induction: Intravenous  PONV Risk Score and Plan: 3 and Ondansetron , Dexamethasone , Midazolam and Treatment may vary due to age or medical condition  Airway Management Planned: Oral ETT  Additional Equipment:   Intra-op Plan:   Post-operative Plan: Extubation in OR  Informed Consent:   Plan Discussed with:   Anesthesia Plan Comments:         Anesthesia Quick Evaluation

## 2024-01-21 ENCOUNTER — Encounter (HOSPITAL_BASED_OUTPATIENT_CLINIC_OR_DEPARTMENT_OTHER): Payer: Self-pay | Admitting: Anesthesiology

## 2024-01-21 ENCOUNTER — Other Ambulatory Visit: Payer: Self-pay

## 2024-01-21 ENCOUNTER — Ambulatory Visit (HOSPITAL_BASED_OUTPATIENT_CLINIC_OR_DEPARTMENT_OTHER): Admission: RE | Admit: 2024-01-21 | Source: Home / Self Care | Admitting: Plastic Surgery

## 2024-01-21 ENCOUNTER — Other Ambulatory Visit (HOSPITAL_BASED_OUTPATIENT_CLINIC_OR_DEPARTMENT_OTHER): Payer: Self-pay

## 2024-01-21 ENCOUNTER — Encounter (HOSPITAL_BASED_OUTPATIENT_CLINIC_OR_DEPARTMENT_OTHER): Admission: RE | Payer: Self-pay | Source: Home / Self Care

## 2024-01-21 DIAGNOSIS — E119 Type 2 diabetes mellitus without complications: Secondary | ICD-10-CM

## 2024-01-21 DIAGNOSIS — Z01818 Encounter for other preprocedural examination: Secondary | ICD-10-CM

## 2024-01-21 SURGERY — EXCISION, MASS, HEAD
Anesthesia: Choice

## 2024-01-21 MED ORDER — MOUNJARO 2.5 MG/0.5ML ~~LOC~~ SOAJ
2.5000 mg | SUBCUTANEOUS | 0 refills | Status: DC
  Filled 2024-01-21 – 2024-02-05 (×2): qty 2, 28d supply, fill #0

## 2024-01-27 ENCOUNTER — Encounter: Admitting: Plastic Surgery

## 2024-01-30 ENCOUNTER — Other Ambulatory Visit (HOSPITAL_BASED_OUTPATIENT_CLINIC_OR_DEPARTMENT_OTHER): Payer: Self-pay

## 2024-01-31 ENCOUNTER — Other Ambulatory Visit (HOSPITAL_BASED_OUTPATIENT_CLINIC_OR_DEPARTMENT_OTHER): Payer: Self-pay

## 2024-02-05 ENCOUNTER — Other Ambulatory Visit (HOSPITAL_BASED_OUTPATIENT_CLINIC_OR_DEPARTMENT_OTHER): Payer: Self-pay

## 2024-02-10 ENCOUNTER — Encounter: Admitting: Physician Assistant

## 2024-02-18 ENCOUNTER — Other Ambulatory Visit: Payer: Self-pay | Admitting: Family Medicine

## 2024-02-19 ENCOUNTER — Other Ambulatory Visit (HOSPITAL_BASED_OUTPATIENT_CLINIC_OR_DEPARTMENT_OTHER): Payer: Self-pay

## 2024-02-19 MED ORDER — MOUNJARO 2.5 MG/0.5ML ~~LOC~~ SOAJ
2.5000 mg | SUBCUTANEOUS | 0 refills | Status: DC
  Filled 2024-02-19 – 2024-02-25 (×2): qty 2, 28d supply, fill #0

## 2024-02-26 ENCOUNTER — Other Ambulatory Visit (HOSPITAL_BASED_OUTPATIENT_CLINIC_OR_DEPARTMENT_OTHER): Payer: Self-pay

## 2024-03-06 ENCOUNTER — Other Ambulatory Visit (HOSPITAL_BASED_OUTPATIENT_CLINIC_OR_DEPARTMENT_OTHER): Payer: Self-pay

## 2024-03-13 ENCOUNTER — Telehealth: Payer: Self-pay

## 2024-03-13 ENCOUNTER — Other Ambulatory Visit (HOSPITAL_BASED_OUTPATIENT_CLINIC_OR_DEPARTMENT_OTHER): Payer: Self-pay

## 2024-03-13 NOTE — Telephone Encounter (Signed)
 PA initiated via Covermymeds; KEY: [id number]. Awaiting determination.

## 2024-03-16 ENCOUNTER — Other Ambulatory Visit (HOSPITAL_BASED_OUTPATIENT_CLINIC_OR_DEPARTMENT_OTHER): Payer: Self-pay

## 2024-03-16 NOTE — Telephone Encounter (Signed)
 PA approved.   Approved. MOUNJARO  2.5MG /0.5ML Soln Auto-inj is approved from 03/14/2024 to 03/14/2025. All strengths of the drug are approved.

## 2024-03-18 ENCOUNTER — Ambulatory Visit: Admitting: Family Medicine

## 2024-03-18 ENCOUNTER — Other Ambulatory Visit (HOSPITAL_BASED_OUTPATIENT_CLINIC_OR_DEPARTMENT_OTHER): Payer: Self-pay

## 2024-03-18 ENCOUNTER — Encounter: Payer: Self-pay | Admitting: Family Medicine

## 2024-03-18 DIAGNOSIS — E559 Vitamin D deficiency, unspecified: Secondary | ICD-10-CM

## 2024-03-18 DIAGNOSIS — E1140 Type 2 diabetes mellitus with diabetic neuropathy, unspecified: Secondary | ICD-10-CM

## 2024-03-18 DIAGNOSIS — E1142 Type 2 diabetes mellitus with diabetic polyneuropathy: Secondary | ICD-10-CM

## 2024-03-18 DIAGNOSIS — E782 Mixed hyperlipidemia: Secondary | ICD-10-CM

## 2024-03-18 DIAGNOSIS — E1169 Type 2 diabetes mellitus with other specified complication: Secondary | ICD-10-CM | POA: Diagnosis not present

## 2024-03-18 DIAGNOSIS — Z7985 Long-term (current) use of injectable non-insulin antidiabetic drugs: Secondary | ICD-10-CM | POA: Diagnosis not present

## 2024-03-18 DIAGNOSIS — Z113 Encounter for screening for infections with a predominantly sexual mode of transmission: Secondary | ICD-10-CM

## 2024-03-18 DIAGNOSIS — Z114 Encounter for screening for human immunodeficiency virus [HIV]: Secondary | ICD-10-CM

## 2024-03-18 MED ORDER — TIRZEPATIDE 5 MG/0.5ML ~~LOC~~ SOAJ
5.0000 mg | SUBCUTANEOUS | 1 refills | Status: AC
  Filled 2024-03-18 – 2024-03-25 (×2): qty 2, 28d supply, fill #0

## 2024-03-18 MED ORDER — ACCU-CHEK GUIDE W/DEVICE KIT
1.0000 | PACK | Freq: Three times a day (TID) | 0 refills | Status: AC
  Filled 2024-03-18: qty 1, 30d supply, fill #0

## 2024-03-18 NOTE — Assessment & Plan Note (Signed)
 See above

## 2024-03-18 NOTE — Progress Notes (Signed)
 "  Established Patient Office Visit Subjective:  [name], female    DOB: 27-Apr-1980  Age: 44 y.o. MRN: [id number]  CC:  Chief Complaint  Patient presents with   Medical Management of Chronic Issues                                                                                                                                                                                              HPI [name] is here for 65-month follow up. Reports she has been doing better overall.    DIABETES: - Checking BG at home: no - Medications: Mounjaro  2.5 mg/week - Compliance: good - Diet: low carb, mostly vegetarian - Exercise: walking >5k steps/day - Eye exam: referral placed  - Foot exam: 12/19/2023 - Microalbumin: UTD - Denies symptoms of hypoglycemia, polyuria, polydipsia, foot ulcers/trauma, wounds that are not healing, medication side effects  - taking gabapentin  at bedtime for neuropathy (following with Neurology)   Lab Results  Component Value Date   HGBA1C 8.1 (H) 12/19/2023   Wt Readings from Last 3 Encounters:  03/18/24 237 lb (107.5 kg)  01/09/24 237 lb (107.5 kg)  01/01/24 238 lb 3.2 oz (108 kg)     HYPERLIPIDEMIA - medications: atorvastatin  10 mg daily - compliance: good - medication SEs: none The 10-year ASCVD risk score (Arnett DK, et al., 2019) is: 3.2%   Values used to calculate the score:     Age: 38 years     Clinically relevant sex: Female     Is Non-Hispanic African American: Yes     Diabetic: Yes     Tobacco smoker: No     Systolic Blood Pressure: 139 mmHg     Is BP treated: No     HDL Cholesterol: 54.9 mg/dL     Total Cholesterol: 211 mg/dL     Vitamin D  Deficiency: - high dose weekly supplement, about to finish, then switching to daily OTC    Vitamin B12 deficiency: - Neurology has recommended she start B12 1,000 mcg daily. Not taking.      03/18/2024    2:15 PM 12/19/2023    2:25 PM 02/28/2023    2:33 PM 09/13/2021    1:48 PM  07/13/2021    1:02 PM  Depression screen PHQ 2/9  Decreased Interest 1 1 0 3 2  Down, Depressed, Hopeless 1 2 0 2 2  PHQ - 2 Score 2 3 0 5 4  Altered sleeping 1 2 1 2 2   Tired, decreased energy 1 2 2 3 2   Change in appetite 0 1 1 2 2   Feeling bad  or failure about yourself  0 1 0 2 3  Trouble concentrating 0 1 0 1 0  Moving slowly or fidgety/restless 0 0 0 0 0  Suicidal thoughts 0 1 0 1 2  PHQ-9 Score 4 11  4  16  15    Difficult doing work/chores Somewhat difficult Somewhat difficult Somewhat difficult Somewhat difficult      Data saved with a previous flowsheet row definition      03/18/2024    2:15 PM 12/19/2023    2:25 PM 02/28/2023    2:34 PM 02/05/2022    1:48 PM  GAD 7 : Generalized Anxiety Score  Nervous, Anxious, on Edge 1 2  1   0   Control/stop worrying 1 2  1  1    Worry too much - different things 1 2  1  1    Trouble relaxing 0 1  1  0   Restless 0 0  0  0   Easily annoyed or irritable 0 2  1  1    Afraid - awful might happen 0 2  1  0   Total GAD 7 Score 3 11 6 3   Anxiety Difficulty Somewhat difficult Somewhat difficult Somewhat difficult Not difficult at all     Data saved with a previous flowsheet row definition    Past Medical History:  Diagnosis Date   Diabetes mellitus without complication (HCC)    Diabetic neuropathy (HCC)    Diarrhea    High cholesterol    Irregular periods    Severe acute respiratory syndrome coronavirus 2 (SARS-CoV-2) vaccination declined 09/03/2019   IMO SNOMED Dx Update Oct 2024      Past Surgical History:  Procedure Laterality Date   CHOLECYSTECTOMY      Family History  Problem Relation Age of Onset   Rheum arthritis Mother    Rheum arthritis Sister    Arthritis Maternal Uncle    Arthritis Paternal Uncle    Diabetes Maternal Grandmother     Social History   Socioeconomic History   Marital status: Single    Spouse name: Not on file   Number of children: Not on file   Years of education: Not on file   Highest  education level: Not on file  Occupational History   Not on file  Tobacco Use   Smoking status: Former    Types: Cigarettes   Smokeless tobacco: Never   Tobacco comments:    Has periods that she smokes for a little while and then stops for a while.  10/12/2021 hfb  12/7/23cigars one or two a day  Vaping Use   Vaping status: Never Used  Substance and Sexual Activity   Alcohol use: Yes    Comment: social   Drug use: Yes    Types: Marijuana   Sexual activity: Not Currently    Birth control/protection: Condom  Other Topics Concern   Not on file  Social History Narrative   Are you right handed or left handed? Right   Are you currently employed ?    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 nieces   Who lives with you?    What type of home do you live in: 1 story or 2 story? two    Caffeine rarely 1 or 2 a week   Social Drivers of Health   Tobacco Use: Medium Risk (01/14/2024)   Patient History    Smoking Tobacco Use: Former    Smokeless  Tobacco Use: Never    Passive Exposure: Not on file  Financial Resource Strain: Unknown (04/18/2021)   [facility]   Overall Financial Resource Strain (CARDIA)    Difficulty of Paying Living Expenses: Patient declined  Food Insecurity: Low Risk (09/18/2022)   Received from [facility]   Epic    Within the past 12 months, you worried that your food would run out before you got money to buy more: Never true    Within the past 12 months, the food you bought just didn't last and you didn't have money to get more. : Never true  Transportation Needs: Unmet Transportation Needs (09/18/2022)   Received from [facility]    In the past 12 months, has lack of reliable transportation kept you from medical appointments, meetings, work or from getting things needed for daily living? : Yes  Physical Activity: Sufficiently Active (04/18/2021)   Received from [facility]   Exercise Vital  Sign    On average, how many days per week do you engage in moderate to strenuous exercise (like a brisk walk)?: 5 days    On average, how many minutes do you engage in exercise at this level?: 60 min  Stress: Unknown (04/18/2021)   Received from [facility] - Occupational Stress Questionnaire    Feeling of Stress : Patient declined  Social Connections: Moderately Integrated (04/18/2021)   Received from [facility]   Social Connection and Isolation Panel    In a typical week, how many times do you talk on the phone with family, friends, or neighbors?: More than three times a week    How often do you get together with friends or relatives?: More than three times a week    How often do you attend church or religious services?: 1 to 4 times per year    Do you belong to any clubs or organizations such as church groups, unions, fraternal or athletic groups, or school groups?: Yes    How often do you attend meetings of the clubs or organizations you belong to?: 1 to 4 times per year    Are you married, widowed, divorced, separated, never married, or living with a partner?: Never married  Intimate Partner Violence: Unknown (04/18/2021)   Received from [facility]   Epic    Within the last year, have you been afraid of your partner or ex-partner?: Patient declined    Within the last year, have you been humiliated or emotionally abused in other ways by your partner or ex-partner?: Patient declined    Within the last year, have you been kicked, hit, slapped, or otherwise physically hurt by your partner or ex-partner?: No    Within the last year, have you been raped or forced to have any kind of sexual activity by your partner or ex-partner?: No  Depression (PHQ2-9): Low Risk (03/18/2024)   Depression (PHQ2-9)    PHQ-2 Score: 4  Recent Concern: Depression (PHQ2-9) - High Risk (12/19/2023)   Depression (PHQ2-9)    PHQ-2 Score: 11  Alcohol Screen: Not on  file  Housing: Low Risk (09/18/2022)   Received from [facility]   Epic    What is your living situation today?: I have a steady place to live    Think about the place you live. Do you have problems with any of the following? Choose all that apply:: None/None on this list  Utilities: Low Risk (09/18/2022)  Received from [facility]   Utilities    In the past 12 months has the electric, gas, oil, or water company threatened to shut off services in your home? : No  Health Literacy: Not on file    ROS All ROS negative except what is listed in the HPI.   Objective:   Today's Vitals: BP 139/73   Pulse 66   Ht 5' 6 (1.676 m)   Wt 237 lb (107.5 kg)   SpO2 100%   BMI 38.25 kg/m   Physical Exam Vitals reviewed.  Constitutional:      General: She is not in acute distress.    Appearance: Normal appearance. She is obese. She is not ill-appearing.  Cardiovascular:     Rate and Rhythm: Normal rate and regular rhythm.  Pulmonary:     Effort: Pulmonary effort is normal.     Breath sounds: Normal breath sounds.  Musculoskeletal:        General: Normal range of motion.     Cervical back: Normal range of motion and neck supple.  Skin:    General: Skin is warm and dry.  Neurological:     General: No focal deficit present.     Mental Status: She is alert and oriented to person, place, and time. Mental status is at baseline.  Psychiatric:        Mood and Affect: Mood normal.        Behavior: Behavior normal.        Thought Content: Thought content normal.        Judgment: Judgment normal.         Assessment & Plan:   Problem List Items Addressed This Visit       Active Problems   Type 2 diabetes mellitus with morbid obesity (HCC) - Primary   Diabetes management ongoing with Mounjaro . 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 Weight stable. - Increased Mounjaro  dose for better diabetes control. - Sent new glucometer kit to pharmacy. - Scheduled future lab  appointment for A1c testing next week (too early today)      Relevant Medications   tirzepatide  (MOUNJARO ) 5 MG/0.5ML Pen   Blood Glucose Monitoring Suppl (ACCU-CHEK GUIDE) w/Device KIT   Other Relevant Orders   Comprehensive metabolic panel with GFR   Hemoglobin A1c   Vitamin D  deficiency   She is taking high-dose vitamin D  weekly. Levels will be rechecked next week. - Will recheck vitamin D  levels next week. - Continue high-dose vitamin D  weekly until levels are reassessed.      Relevant Orders   VITAMIN D  25 Hydroxy (Vit-D Deficiency, Fractures)   Mixed hyperlipidemia   Hyperlipidemia managed with Lipitor 10 mg. Medication compliance encouraged.  - Refill Lipitor prescription.      Relevant Orders   Lipid panel   Diabetic polyneuropathy associated with type 2 diabetes mellitus (HCC)   Stable with gabapentin . Follow-up with Neurology.       Relevant Medications   tirzepatide  (MOUNJARO ) 5 MG/0.5ML Pen   Type 2 diabetes mellitus with diabetic neuropathy, without long-term current use of insulin  (HCC)   See above      Relevant Medications   tirzepatide  (MOUNJARO ) 5 MG/0.5ML Pen   Other Visit Diagnoses       Encounter for screening for HIV         Screen for STD (sexually transmitted disease)     She requested HIV and STD screening. No symptoms reported. - Ordered labs   Relevant Orders  HIV Antibody (routine testing w rflx)   RPR W/RFLX TO RPR TITER, TREPONEMAL AB, SCREEN AND DIAGNOSIS   Herpes Simplex Virus 1 and 2 (IgG), with Reflex to HSV-2 Inhibition   Ct/GC NAA, Pharyngeal   Urine cytology ancillary only         Follow-up: Return in about 6 months (around 09/15/2024) for chronic disease management; lab appointment next week .   Waddell FURY Almarie, DNP, FNP-C  I,Emily Lagle,acting as a neurosurgeon for Waddell KATHEE Almarie, NP.,have documented all relevant documentation on the behalf of Waddell KATHEE Almarie, NP.  I, Waddell KATHEE Almarie, NP, have reviewed all documentation for this  visit. The documentation on 03/18/2024 for the exam, diagnosis, procedures, and orders are all accurate and complete.  "

## 2024-03-18 NOTE — Assessment & Plan Note (Signed)
 Stable with gabapentin . Follow-up with Neurology.

## 2024-03-18 NOTE — Assessment & Plan Note (Signed)
 Diabetes management ongoing with Mounjaro . 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 Weight stable. - Increased Mounjaro  dose for better diabetes control. - Sent new glucometer kit to pharmacy. - Scheduled future lab appointment for A1c testing next week (too early today)

## 2024-03-18 NOTE — Assessment & Plan Note (Signed)
 She is taking high-dose vitamin D  weekly. Levels will be rechecked next week. - Will recheck vitamin D  levels next week. - Continue high-dose vitamin D  weekly until levels are reassessed.

## 2024-03-18 NOTE — Assessment & Plan Note (Signed)
 Hyperlipidemia managed with Lipitor 10 mg. Medication compliance encouraged.  - Refill Lipitor prescription.

## 2024-03-19 ENCOUNTER — Encounter (HOSPITAL_BASED_OUTPATIENT_CLINIC_OR_DEPARTMENT_OTHER): Payer: Self-pay

## 2024-03-19 ENCOUNTER — Other Ambulatory Visit: Payer: Self-pay

## 2024-03-19 ENCOUNTER — Emergency Department (HOSPITAL_BASED_OUTPATIENT_CLINIC_OR_DEPARTMENT_OTHER)

## 2024-03-19 ENCOUNTER — Emergency Department (HOSPITAL_BASED_OUTPATIENT_CLINIC_OR_DEPARTMENT_OTHER)
Admission: EM | Admit: 2024-03-19 | Discharge: 2024-03-19 | Disposition: A | Discharge status: Home / Self Care | Attending: Emergency Medicine | Admitting: Emergency Medicine

## 2024-03-19 DIAGNOSIS — S80212A Abrasion, left knee, initial encounter: Secondary | ICD-10-CM | POA: Diagnosis not present

## 2024-03-19 DIAGNOSIS — S80211A Abrasion, right knee, initial encounter: Secondary | ICD-10-CM | POA: Insufficient documentation

## 2024-03-19 DIAGNOSIS — W010XXA Fall on same level from slipping, tripping and stumbling without subsequent striking against object, initial encounter: Secondary | ICD-10-CM | POA: Diagnosis not present

## 2024-03-19 DIAGNOSIS — S93401A Sprain of unspecified ligament of right ankle, initial encounter: Secondary | ICD-10-CM | POA: Insufficient documentation

## 2024-03-19 DIAGNOSIS — Z91040 Latex allergy status: Secondary | ICD-10-CM | POA: Insufficient documentation

## 2024-03-19 DIAGNOSIS — T148XXA Other injury of unspecified body region, initial encounter: Secondary | ICD-10-CM

## 2024-03-19 DIAGNOSIS — S99911A Unspecified injury of right ankle, initial encounter: Secondary | ICD-10-CM | POA: Diagnosis present

## 2024-03-19 MED ORDER — ACETAMINOPHEN 325 MG PO TABS
650.0000 mg | ORAL_TABLET | Freq: Once | ORAL | Status: AC
  Administered 2024-03-19: 650 mg via ORAL
  Filled 2024-03-19: qty 2

## 2024-03-19 NOTE — ED Triage Notes (Signed)
 Reports falling after turning around, twisting R ankle. R ankle pain and swelling.  Also reports scratched up both knees   Ambulatory

## 2024-03-19 NOTE — ED Notes (Signed)
 Wound care completed per order. Patient given extra supplies to do wound care at home after discharge.

## 2024-03-19 NOTE — ED Provider Notes (Signed)
 " [place] [facility] Provider Note   CSN: [id number] Arrival date & time: 03/19/24  9277     Patient presents with: Colleen Downs is a 44 y.o. female.   Patient here here with right ankle pain bilateral knee pain after she tripped and fell.  Did not hit her head or lose consciousness.  No pain elsewhere.  She is able to ambulate with discomfort.  Show mechanical fall while trying to deliver something at someone's house where it was dark.  Denies any weakness numbness tingling.  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  The history is provided by the patient.       Prior to Admission medications  Medication Sig Start Date End Date Taking? Authorizing Provider  atorvastatin  (LIPITOR) 10 MG tablet Take 1 tablet (10 mg total) by mouth daily. 12/19/23   Almarie Waddell NOVAK, NP  Blood Glucose Monitoring Suppl (ACCU-CHEK GUIDE) w/Device KIT Use  in the morning, at noon, and at bedtime. 03/18/24   Almarie Waddell NOVAK, NP  cholecalciferol  (VITAMIN D3) 25 MCG (1000 UNIT) tablet Take 1 tablet (1,000 Units total) by mouth daily. 12/19/23   Almarie Waddell NOVAK, NP  cyanocobalamin  (VITAMIN B12) 1000 MCG tablet Take 1 tablet (1,000 mcg total) by mouth daily. 12/19/23   Almarie Waddell NOVAK, NP  gabapentin  (NEURONTIN ) 400 MG capsule Take 2 capsules (800 mg total) by mouth at bedtime. 01/09/24   Leigh Venetia CROME, MD  tirzepatide  (MOUNJARO ) 5 MG/0.5ML Pen Inject 5 mg into the skin once a week. 03/18/24   Almarie Waddell NOVAK, NP  Vitamin D , Ergocalciferol , (DRISDOL ) 1.25 MG (50000 UNIT) CAPS capsule Take 1 capsule (50,000 Units total) by mouth every 7 (seven) days. 12/20/23   Almarie Waddell NOVAK, NP    Allergies: Sulfa drugs cross reactors and Latex    Review of Systems  Updated Vital Signs BP (!) 156/91   Pulse 64   Temp 98.6 F (37 C)   Resp 16   LMP 03/17/2024 (Approximate)   SpO2 100%   Physical Exam Vitals and nursing note reviewed.  Constitutional:      General:  She is not in acute distress.    Appearance: She is well-developed. She is not ill-appearing.  HENT:     Head: Normocephalic and atraumatic.     Mouth/Throat:     Mouth: Mucous membranes are moist.  Eyes:     Extraocular Movements: Extraocular movements intact.     Conjunctiva/sclera: Conjunctivae normal.     Pupils: Pupils are equal, round, and reactive to light.  Cardiovascular:     Rate and Rhythm: Normal rate and regular rhythm.     Pulses: Normal pulses.     Heart sounds: Normal heart sounds. No murmur heard. Pulmonary:     Effort: Pulmonary effort is normal. No respiratory distress.     Breath sounds: Normal breath sounds.  Abdominal:     Palpations: Abdomen is soft.     Tenderness: There is no abdominal tenderness.  Musculoskeletal:        General: Tenderness present. No swelling.     Cervical back: Normal range of motion and neck supple.     Comments: Tenderness to both knees, tenderness of the right ankle and right foot but no obvious deformity  Skin:    General: Skin is warm and dry.     Capillary Refill: Capillary refill takes less than 2 seconds.     Comments: Abrasions to both  knees  Neurological:     General: No focal deficit present.     Mental Status: She is alert.  Psychiatric:        Mood and Affect: Mood normal.     (all labs ordered are listed, but only abnormal results are displayed) Labs Reviewed - No data to display  EKG: None  Radiology: DG Foot Complete Right Result Date: 03/19/2024 EXAM: 3 VIEW(S) XRAY OF THE RIGHT FOOT 03/19/2024 08:28:00 AM COMPARISON: Right ankle series 03/19/2024 and right foot series 07/30/2016. CLINICAL HISTORY: 44 year old female with a fall, twisting injury, and pain. FINDINGS: BONES AND JOINTS: No acute fracture. No malalignment. Chronic calcaneus degenerative spurring with superior and inferior calcaneal spurs noted. SOFT TISSUES: Marked soft tissue swelling of the forefoot. IMPRESSION: 1. No acute fracture or  dislocation identified about the right foot. 2. Soft tissue swelling of the forefoot. Electronically signed by: Helayne Hurst MD 03/19/2024 08:44 AM EST RP Workstation: [id number]   DG Knee 2 Views Right Result Date: 03/19/2024 EXAM: 2 VIEW(S) XRAY OF THE RIGHT KNEE 03/19/2024 08:28:00 AM COMPARISON: None available. CLINICAL HISTORY: 44 year old female. Twisting injury with pain. FINDINGS: BONES AND JOINTS: No acute fracture. No malalignment. No significant joint effusion. No significant degenerative changes. SOFT TISSUES: Unremarkable. IMPRESSION: 1. No acute fracture or dislocation identified about the right knee. Electronically signed by: Helayne Hurst MD 03/19/2024 08:43 AM EST RP Workstation: [id number]   DG Knee 2 Views Left Result Date: 03/19/2024 EXAM: 2 VIEW(S) XRAY OF THE LEFT KNEE 03/19/2024 08:28:00 AM COMPARISON: Left knee series 01/22/2008. CLINICAL HISTORY: 44 year old female. Twisting injury with pain. FINDINGS: BONES AND JOINTS: No acute fracture. No malalignment. No significant joint effusion. No significant degenerative changes. SOFT TISSUES: Unremarkable. IMPRESSION: 1. No acute fracture or dislocation identified about the left knee. Electronically signed by: Helayne Hurst MD 03/19/2024 08:42 AM EST RP Workstation: [id number]   DG Ankle Complete Right Result Date: 03/19/2024 EXAM: 3 OR MORE VIEW(S) XRAY OF THE RIGHT ANKLE 03/19/2024 08:28:00 AM CLINICAL HISTORY: 44 year old female with ankle pain following a twisting injury. COMPARISON: Right ankle series 07/30/2016. FINDINGS: BONES AND JOINTS: No acute fracture. No malalignment. Plantar and posterior calcaneal spur. 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 SOFT TISSUES: Subcutaneous soft tissue edema. IMPRESSION: 1. No acute fracture or dislocation identified about the right ankle. 2. Soft tissue swelling. Electronically signed by: Helayne Hurst MD 03/19/2024 08:41 AM EST RP Workstation: [id number]     Procedures   Medications  Ordered in the ED  acetaminophen  (TYLENOL ) tablet 650 mg (650 mg Oral Given 03/19/24 0742)                                    Medical Decision Making Amount and/or Complexity of Data Reviewed Radiology: ordered.  Risk OTC drugs.   Myriah Boggus is here for bilateral knee pain right ankle and foot pain after fall.  Normal vitals except for mildly elevated blood pressure likely in the setting of pain.  Very well-appearing.  Neurovascular neuromuscular intact on exam.  Abrasions to both knees but good range of motion.  Tenderness to the right ankle and right foot without any major deformity.  Differential diagnosis likely ankle foot sprain versus less likely fracture, suspect knee contusions.  Will get x-rays of knees and right foot and ankle.  Will place bacitracin ointment over abrasions.  She is well-appearing otherwise.  Did not hit her head or lose consciousness.  Not on  blood thinner.  X-rays per radiology report with no acute findings.  Some soft tissue swelling around the right ankle which I suggest this from ankle sprain.  Will put her in a walking boot.  Will Follow-up orthopedics.  Recommend Tylenol  rest ice and activity as tolerated.  Bacitracin ointment twice daily to wounds.  Discharge.  This chart was dictated using voice recognition software.  Despite best efforts to proofread,  errors can occur which can change the documentation meaning.      Final diagnoses:  Sprain of right ankle, unspecified ligament, initial encounter  Abrasion    ED Discharge Orders     None          Ruthe Cornet, DO 03/19/24 0848  "

## 2024-03-19 NOTE — Discharge Instructions (Signed)
 Weight-bear as tolerated with walking boot.  I do suspect you have an ankle sprain.  Recommend 650 mg of Tylenol  every 6 hours as needed for pain, recommend 20 minutes of ice on and off several times a day to the affected area as well.  Regarding your abrasions I recommend buying some over-the-counter bacitracin or Neosporin and using twice daily to the affected area and cover with a Band-Aid.  Can do this for several days.  Follow-up with orthopedics.

## 2024-03-20 LAB — CT/GC NAA, PHARYNGEAL
C TRACH RRNA NPH QL PCR: NEGATIVE
N GONORRHOEA RRNA NPH QL PCR: NEGATIVE

## 2024-03-22 ENCOUNTER — Ambulatory Visit: Payer: Self-pay | Admitting: Family Medicine

## 2024-03-23 ENCOUNTER — Ambulatory Visit: Admitting: Plastic Surgery

## 2024-03-25 ENCOUNTER — Other Ambulatory Visit (HOSPITAL_COMMUNITY)
Admission: RE | Admit: 2024-03-25 | Discharge: 2024-03-25 | Disposition: A | Discharge status: Home / Self Care | Source: Ambulatory Visit | Attending: Family Medicine | Admitting: Family Medicine

## 2024-03-25 ENCOUNTER — Other Ambulatory Visit (HOSPITAL_BASED_OUTPATIENT_CLINIC_OR_DEPARTMENT_OTHER): Payer: Self-pay

## 2024-03-25 ENCOUNTER — Other Ambulatory Visit (INDEPENDENT_AMBULATORY_CARE_PROVIDER_SITE_OTHER)

## 2024-03-25 DIAGNOSIS — E559 Vitamin D deficiency, unspecified: Secondary | ICD-10-CM | POA: Diagnosis not present

## 2024-03-25 DIAGNOSIS — E1169 Type 2 diabetes mellitus with other specified complication: Secondary | ICD-10-CM

## 2024-03-25 DIAGNOSIS — E782 Mixed hyperlipidemia: Secondary | ICD-10-CM

## 2024-03-25 DIAGNOSIS — Z113 Encounter for screening for infections with a predominantly sexual mode of transmission: Secondary | ICD-10-CM

## 2024-03-25 NOTE — Addendum Note (Signed)
 Addended by: ESTELLE GILLIS D on: 03/25/2024 03:40 PM   Modules accepted: Orders

## 2024-03-26 LAB — COMPREHENSIVE METABOLIC PANEL WITH GFR
ALT: 15 U/L (ref 3–35)
AST: 14 U/L (ref 5–37)
Albumin: 4.1 g/dL (ref 3.5–5.2)
Alkaline Phosphatase: 40 U/L (ref 39–117)
BUN: 9 mg/dL (ref 6–23)
CO2: 30 meq/L (ref 19–32)
Calcium: 9.4 mg/dL (ref 8.4–10.5)
Chloride: 102 meq/L (ref 96–112)
Creatinine, Ser: 0.8 mg/dL (ref 0.40–1.20)
GFR: 90.3 mL/min
Glucose, Bld: 128 mg/dL — ABNORMAL HIGH (ref 70–99)
Potassium: 4.2 meq/L (ref 3.5–5.1)
Sodium: 138 meq/L (ref 135–145)
Total Bilirubin: 0.3 mg/dL (ref 0.2–1.2)
Total Protein: 7.2 g/dL (ref 6.0–8.3)

## 2024-03-26 LAB — LIPID PANEL
Cholesterol: 174 mg/dL (ref 28–200)
HDL: 43.5 mg/dL
LDL Cholesterol: 74 mg/dL (ref 10–99)
NonHDL: 130.44
Total CHOL/HDL Ratio: 4
Triglycerides: 280 mg/dL — ABNORMAL HIGH (ref 10.0–149.0)
VLDL: 56 mg/dL — ABNORMAL HIGH (ref 0.0–40.0)

## 2024-03-26 LAB — HIV ANTIBODY (ROUTINE TESTING W REFLEX)
HIV 1&2 Ab, 4th Generation: NONREACTIVE
HIV FINAL INTERPRETATION: NEGATIVE

## 2024-03-26 LAB — HEMOGLOBIN A1C: Hgb A1c MFr Bld: 7.4 % — ABNORMAL HIGH (ref 4.6–6.5)

## 2024-03-26 LAB — VITAMIN D 25 HYDROXY (VIT D DEFICIENCY, FRACTURES): VITD: 28.87 ng/mL — ABNORMAL LOW (ref 30.00–100.00)

## 2024-03-26 LAB — SYPHILIS: RPR W/REFLEX TO RPR TITER AND TREPONEMAL ANTIBODIES, TRADITIONAL SCREENING AND DIAGNOSIS ALGORITHM: RPR Ser Ql: NONREACTIVE

## 2024-03-27 LAB — URINE CYTOLOGY ANCILLARY ONLY
Chlamydia: NEGATIVE
Comment: NEGATIVE
Comment: NEGATIVE
Comment: NORMAL
Neisseria Gonorrhea: NEGATIVE
Trichomonas: NEGATIVE

## 2024-03-28 LAB — HERPES SIMPLEX VIRUS 1 AND 2 (IGG),REFLEX HSV-2 INHIBITION
HSV 1 IGG,TYPE SPECIFIC AB: 29.2 {index} — ABNORMAL HIGH
HSV 2 IGG,TYPE SPECIFIC AB: 4.22 {index} — ABNORMAL HIGH

## 2024-03-28 LAB — HSV 2 INHIBITION: HSV 2 IGG INHIBITION,IA: POSITIVE — AB

## 2024-07-15 ENCOUNTER — Ambulatory Visit: Admitting: Neurology

## 2024-09-23 ENCOUNTER — Ambulatory Visit: Admitting: Family Medicine
